# Patient Record
Sex: Female | Born: 1950
Health system: Southern US, Community
[De-identification: ages and names within clinical notes are randomized; demographics above are authoritative.]

## PROBLEM LIST (undated history)

## (undated) DIAGNOSIS — I639 Cerebral infarction, unspecified: Secondary | ICD-10-CM

## (undated) DIAGNOSIS — I10 Essential (primary) hypertension: Secondary | ICD-10-CM

## (undated) DIAGNOSIS — K219 Gastro-esophageal reflux disease without esophagitis: Secondary | ICD-10-CM

## (undated) DIAGNOSIS — R0683 Snoring: Secondary | ICD-10-CM

## (undated) HISTORY — DX: Cerebral infarction, unspecified: I63.9

## (undated) HISTORY — PX: CATARACT EXTRACTION: SUR2

## (undated) HISTORY — PX: COLONOSCOPY: SHX174

---

## 1998-09-19 ENCOUNTER — Other Ambulatory Visit: Admission: RE | Admit: 1998-09-19 | Discharge: 1998-09-19 | Payer: Self-pay | Admitting: *Deleted

## 1998-11-13 ENCOUNTER — Other Ambulatory Visit: Admission: RE | Admit: 1998-11-13 | Discharge: 1998-11-13 | Payer: Self-pay | Admitting: *Deleted

## 1999-10-01 ENCOUNTER — Other Ambulatory Visit: Admission: RE | Admit: 1999-10-01 | Discharge: 1999-10-01 | Payer: Self-pay | Admitting: *Deleted

## 2003-02-16 HISTORY — PX: VAGINAL HYSTERECTOMY: SUR661

## 2003-03-13 ENCOUNTER — Other Ambulatory Visit: Payer: Self-pay

## 2004-03-31 ENCOUNTER — Ambulatory Visit: Payer: Self-pay | Admitting: Obstetrics and Gynecology

## 2004-07-27 ENCOUNTER — Ambulatory Visit: Payer: Self-pay | Admitting: Unknown Physician Specialty

## 2004-07-27 LAB — HM COLONOSCOPY: HM COLON: NORMAL

## 2005-04-20 ENCOUNTER — Ambulatory Visit: Payer: Self-pay | Admitting: Obstetrics and Gynecology

## 2005-08-17 ENCOUNTER — Other Ambulatory Visit: Payer: Self-pay

## 2005-08-23 ENCOUNTER — Ambulatory Visit: Payer: Self-pay | Admitting: Obstetrics and Gynecology

## 2006-04-26 ENCOUNTER — Ambulatory Visit: Payer: Self-pay | Admitting: Obstetrics and Gynecology

## 2007-04-28 ENCOUNTER — Ambulatory Visit: Payer: Self-pay | Admitting: Obstetrics and Gynecology

## 2008-05-01 ENCOUNTER — Ambulatory Visit: Payer: Self-pay | Admitting: Family Medicine

## 2008-05-29 DIAGNOSIS — K219 Gastro-esophageal reflux disease without esophagitis: Secondary | ICD-10-CM | POA: Insufficient documentation

## 2008-05-29 DIAGNOSIS — M1990 Unspecified osteoarthritis, unspecified site: Secondary | ICD-10-CM | POA: Insufficient documentation

## 2008-05-29 DIAGNOSIS — J309 Allergic rhinitis, unspecified: Secondary | ICD-10-CM | POA: Insufficient documentation

## 2008-12-26 DIAGNOSIS — R5381 Other malaise: Secondary | ICD-10-CM | POA: Insufficient documentation

## 2009-07-15 ENCOUNTER — Ambulatory Visit: Payer: Self-pay | Admitting: Family Medicine

## 2010-07-22 ENCOUNTER — Ambulatory Visit: Payer: Self-pay | Admitting: Family Medicine

## 2011-07-27 ENCOUNTER — Ambulatory Visit: Payer: Self-pay | Admitting: Family Medicine

## 2012-08-30 ENCOUNTER — Ambulatory Visit: Payer: Self-pay | Admitting: Family Medicine

## 2013-11-16 LAB — CBC AND DIFFERENTIAL
HCT: 38 % (ref 36–46)
HEMOGLOBIN: 12.8 g/dL (ref 12.0–16.0)
NEUTROS ABS: 51 /uL
Platelets: 284 10*3/uL (ref 150–399)
WBC: 5.4 10^3/mL

## 2013-11-16 LAB — LIPID PANEL
Cholesterol: 148 mg/dL (ref 0–200)
HDL: 40 mg/dL (ref 35–70)
LDL Cholesterol: 81 mg/dL
LDL/HDL RATIO: 2
TRIGLYCERIDES: 137 mg/dL (ref 40–160)

## 2013-11-16 LAB — HEPATIC FUNCTION PANEL
ALK PHOS: 52 U/L (ref 25–125)
ALT: 15 U/L (ref 7–35)
AST: 12 U/L — AB (ref 13–35)
Bilirubin, Total: 0.3 mg/dL

## 2013-11-16 LAB — BASIC METABOLIC PANEL
BUN: 12 mg/dL (ref 4–21)
CREATININE: 0.8 mg/dL (ref 0.5–1.1)
Glucose: 93 mg/dL
Potassium: 4.5 mmol/L (ref 3.4–5.3)
SODIUM: 143 mmol/L (ref 137–147)

## 2013-11-16 LAB — HEMOGLOBIN A1C: HEMOGLOBIN A1C: 5.7 % (ref 4.0–6.0)

## 2013-11-16 LAB — TSH: TSH: 2.06 u[IU]/mL (ref 0.41–5.90)

## 2013-12-18 ENCOUNTER — Ambulatory Visit: Payer: Self-pay | Admitting: Family Medicine

## 2013-12-18 LAB — HM MAMMOGRAPHY: HM MAMMO: NEGATIVE

## 2014-07-08 DIAGNOSIS — E538 Deficiency of other specified B group vitamins: Secondary | ICD-10-CM | POA: Insufficient documentation

## 2014-07-08 DIAGNOSIS — M791 Myalgia, unspecified site: Secondary | ICD-10-CM | POA: Insufficient documentation

## 2014-07-08 DIAGNOSIS — R7303 Prediabetes: Secondary | ICD-10-CM | POA: Insufficient documentation

## 2014-07-08 DIAGNOSIS — R0683 Snoring: Secondary | ICD-10-CM | POA: Insufficient documentation

## 2014-09-16 ENCOUNTER — Encounter: Payer: Self-pay | Admitting: Family Medicine

## 2014-09-16 ENCOUNTER — Ambulatory Visit (INDEPENDENT_AMBULATORY_CARE_PROVIDER_SITE_OTHER): Payer: 59 | Admitting: Family Medicine

## 2014-09-16 VITALS — BP 108/62 | HR 60 | Temp 97.8°F | Resp 14 | Ht 65.0 in | Wt 198.0 lb

## 2014-09-16 DIAGNOSIS — R7309 Other abnormal glucose: Secondary | ICD-10-CM | POA: Diagnosis not present

## 2014-09-16 DIAGNOSIS — E785 Hyperlipidemia, unspecified: Secondary | ICD-10-CM | POA: Diagnosis not present

## 2014-09-16 DIAGNOSIS — K219 Gastro-esophageal reflux disease without esophagitis: Secondary | ICD-10-CM | POA: Diagnosis not present

## 2014-09-16 DIAGNOSIS — I10 Essential (primary) hypertension: Secondary | ICD-10-CM

## 2014-09-16 DIAGNOSIS — R7303 Prediabetes: Secondary | ICD-10-CM

## 2014-09-16 NOTE — Progress Notes (Signed)
Patient ID: Sharon Small, female   DOB: 11-28-1950, 64 y.o.   MRN: [id number]    Subjective:  HPI  Hypertension follow up: Patient was last seen on 05/07/14. B/P at that time was 118/70. Patient is  Not checking her B/P outside of the office. She is not having any cardiac symptoms.  BP Readings from Last 3 Encounters:  09/16/14 108/62  05/07/14 118/70    Hyperlipidemia follow up: She takes Simvastatin daily. Last level checked on 11/16/13 Lab Results  Component Value Date   CHOL 148 11/16/2013   HDL 40 11/16/2013   LDLCALC 81 11/16/2013   TRIG 137 11/16/2013   Patient is tolerating medication without side effects.  GERD follow up: Due to concerns for Omeprazole leading to possible dementia on the last visit patient was advised to take Zantac twice daily for 2 months and then go back to Omeprazole. Insurance would not cover Zantac 300 mg twice daily, so she took 150 mg 2 tablet twice daily and Tums intermittently. Since she still had to take Tums with Zantac she finished Zantac and just taking Tums. She states Tums does help when she gets a flare up but when she was on Omeprazole she did not have any breakthrough with heartburn but due to dementia risks she does not want to take that..     Prior to Admission medications   Medication Sig Start Date End Date Taking? Authorizing Provider  amLODipine-benazepril (LOTREL) 5-20 MG per capsule Take 1 capsule by mouth daily. 03/29/14   Historical Provider, MD  aspirin 81 MG tablet Take 1 tablet by mouth daily. 05/29/08   Historical Provider, MD  Calcium Carbonate 500 MG CHEW Chew 1 tablet by mouth daily. 05/29/08   Historical Provider, MD  Cranberry 1000 MG CAPS Take 2 capsules by mouth daily.    Historical Provider, MD  doxycycline (ORACEA) 40 MG capsule Take 1 capsule by mouth daily.    Historical Provider, MD  Magnesium 65 MG TABS Take 1 tablet by mouth daily. 04/08/09   Historical Provider, MD  Misc Natural Products (GLUCOSAMINE  CHONDROITIN COMPLX) TABS Take 1 tablet by mouth daily.    Historical Provider, MD  omeprazole (PRILOSEC) 20 MG capsule Take 1 capsule by mouth daily. 07/28/11   Historical Provider, MD  ranitidine (ZANTAC) 300 MG capsule Take 1 capsule by mouth 2 (two) times daily. 05/07/14   Historical Provider, MD  simvastatin (ZOCOR) 20 MG tablet Take 1 tablet by mouth at bedtime. 11/13/13   Historical Provider, MD    Patient Active Problem List   Diagnosis Date Noted  . Borderline diabetes 07/08/2014  . Muscle ache 07/08/2014  . Snores 07/08/2014  . B12 deficiency 07/08/2014  . Malaise and fatigue 12/26/2008  . Allergic rhinitis 05/29/2008  . Acid reflux 05/29/2008  . Essential (primary) hypertension 05/29/2008  . HLD (hyperlipidemia) 05/29/2008  . Adiposity 05/29/2008  . Arthritis, degenerative 05/29/2008    No past medical history on file.  History   Social History  . Marital Status: Married    Spouse Name: N/A  . Number of Children: N/A  . Years of Education: N/A   Occupational History  . Not on file.   Social History Main Topics  . Smoking status: Never Smoker   . Smokeless tobacco: Never Used  . Alcohol Use: No  . Drug Use: No  . Sexual Activity: Not Currently   Other Topics Concern  . Not on file   Social History Narrative    No Known  Allergies  Review of Systems  Constitutional: Negative for fever, chills, weight loss and malaise/fatigue.  Respiratory: Negative for cough, sputum production, shortness of breath and wheezing.   Cardiovascular: Positive for leg swelling. Negative for chest pain, palpitations, orthopnea and claudication.  Gastrointestinal: Positive for heartburn (at times and will have chest tightness and cough then also.).  Musculoskeletal: Negative for myalgias, back pain, joint pain and neck pain.  Neurological: Negative.  Negative for headaches.  Endo/Heme/Allergies: Negative.   Psychiatric/Behavioral: Negative.     Immunization History    Administered Date(s) Administered  . Tdap 11/23/2006  . Zoster 07/28/2011   Objective:  BP 108/62 mmHg  Pulse 60  Temp(Src) 97.8 F (36.6 C)  Resp 14  Ht 5\' 5"  (1.651 m)  Wt 198 lb (89.812 kg)  BMI 32.95 kg/m2  Physical Exam  Constitutional: She is oriented to person, place, and time and well-developed, well-nourished, and in no distress.  HENT:  Head: Normocephalic.  Eyes: Conjunctivae are normal. Pupils are equal, round, and reactive to light.  Neck: Normal range of motion. Neck supple.  Cardiovascular: Normal rate, regular rhythm, normal heart sounds and intact distal pulses.  Exam reveals no friction rub.   No murmur heard. Pulmonary/Chest: Breath sounds normal. No respiratory distress. She has no wheezes. She has no rales.  Abdominal: Soft. Bowel sounds are normal. She exhibits no distension. There is no tenderness.  Musculoskeletal: Normal range of motion. She exhibits no edema or tenderness.  Neurological: She is alert and oriented to person, place, and time. Gait normal.  Skin: Skin is warm and dry.  Psychiatric: Mood, memory, affect and judgment normal.    Lab Results  Component Value Date   WBC 5.4 11/16/2013   HGB 12.8 11/16/2013   HCT 38 11/16/2013   PLT 284 11/16/2013   CHOL 148 11/16/2013   TRIG 137 11/16/2013   HDL 40 11/16/2013   LDLCALC 81 11/16/2013   TSH 2.06 11/16/2013   HGBA1C 5.7 11/16/2013    CMP     Component Value Date/Time   NA 143 11/16/2013   K 4.5 11/16/2013   BUN 12 11/16/2013   CREATININE 0.8 11/16/2013   AST 12* 11/16/2013   ALT 15 11/16/2013   ALKPHOS 52 11/16/2013    Assessment and Plan :  1. Essential (primary) hypertension Stable. Continue current medication.  2. Gastroesophageal reflux disease, esophagitis presence not specified Zantac is not controlling symptoms well. Patient will take Tums and patient advised to write down how many times she has to take it a day/a week. Will re check on the next visit.  3. HLD  (hyperlipidemia) Stable on the last check.  4. 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  Consider BMD on the next visit.    Patient was seen and examined by Dr. Eulas Post and note was scribed by Theressa Millard, RMA.    Miguel Aschoff MD [facility] 09/16/2014 4:23 PM

## 2014-10-17 ENCOUNTER — Ambulatory Visit: Payer: 59 | Admitting: Family Medicine

## 2014-10-24 ENCOUNTER — Ambulatory Visit (INDEPENDENT_AMBULATORY_CARE_PROVIDER_SITE_OTHER): Payer: 59 | Admitting: Family Medicine

## 2014-10-24 ENCOUNTER — Encounter: Payer: Self-pay | Admitting: Family Medicine

## 2014-10-24 VITALS — BP 128/72 | HR 62 | Temp 97.6°F | Resp 14 | Wt 199.0 lb

## 2014-10-24 DIAGNOSIS — E669 Obesity, unspecified: Secondary | ICD-10-CM

## 2014-10-24 DIAGNOSIS — M791 Myalgia, unspecified site: Secondary | ICD-10-CM

## 2014-10-24 DIAGNOSIS — E785 Hyperlipidemia, unspecified: Secondary | ICD-10-CM | POA: Diagnosis not present

## 2014-10-24 DIAGNOSIS — R7309 Other abnormal glucose: Secondary | ICD-10-CM | POA: Diagnosis not present

## 2014-10-24 DIAGNOSIS — K219 Gastro-esophageal reflux disease without esophagitis: Secondary | ICD-10-CM | POA: Diagnosis not present

## 2014-10-24 DIAGNOSIS — R7303 Prediabetes: Secondary | ICD-10-CM

## 2014-10-24 NOTE — Progress Notes (Signed)
Patient ID: Sharon Small, female   DOB: [birth year], 64 y.o.   MRN: [id number]    Subjective:  HPI  GERD follow up: Patient is here for 1 month follow up. Patient has been taking Tums on average 3 daily sometimes less but usually no more than that. She is happy with just taking Tums daily as long as there is no long term issues that she can develop from it.  Prior to Admission medications   Medication Sig Start Date End Date Taking? Authorizing Provider  amLODipine-benazepril (LOTREL) 5-20 MG per capsule Take 1 capsule by mouth daily. 03/29/14  Yes Historical Provider, MD  aspirin 81 MG tablet Take 1 tablet by mouth daily. 05/29/08  Yes Historical Provider, MD  Biotin 10 MG CAPS Take by mouth.   Yes Historical Provider, MD  Calcium Carbonate 500 MG CHEW Chew 1 tablet by mouth daily. 05/29/08  Yes Historical Provider, MD  FINACEA 15 % FOAM APPLY TO SKIN BID 08/10/14  Yes Historical Provider, MD  Magnesium 65 MG TABS Take 1 tablet by mouth daily. 04/08/09  Yes Historical Provider, MD  Misc Natural Products (GLUCOSAMINE CHONDROITIN COMPLX) TABS Take 1 tablet by mouth daily.   Yes Historical Provider, MD  simvastatin (ZOCOR) 20 MG tablet Take 1 tablet by mouth at bedtime. 11/13/13  Yes Historical Provider, MD    Patient Active Problem List   Diagnosis Date Noted  . Borderline diabetes 07/08/2014  . Muscle ache 07/08/2014  . Snores 07/08/2014  . B12 deficiency 07/08/2014  . Malaise and fatigue 12/26/2008  . Allergic rhinitis 05/29/2008  . Acid reflux 05/29/2008  . Essential (primary) hypertension 05/29/2008  . HLD (hyperlipidemia) 05/29/2008  . Adiposity 05/29/2008  . Arthritis, degenerative 05/29/2008    No past medical history on file.  Social History   Social History  . Marital Status: Married    Spouse Name: N/A  . Number of Children: N/A  . Years of Education: N/A   Occupational History  . Not on file.   Social History Main Topics  . Smoking status: Never Smoker   .  Smokeless tobacco: Never Used  . Alcohol Use: No  . Drug Use: No  . Sexual Activity: Not Currently   Other Topics Concern  . Not on file   Social History Narrative    No Known Allergies  Review of Systems  Constitutional: Negative.   Respiratory: Negative.   Cardiovascular: Negative.   Gastrointestinal: Negative.   Musculoskeletal: Positive for myalgias. Negative for back pain, joint pain and neck pain.  Skin: Negative.   Neurological: Negative.   Endo/Heme/Allergies: Negative.   Psychiatric/Behavioral: Negative.     Immunization History  Administered Date(s) Administered  . Tdap 11/23/2006  . Zoster 07/28/2011   Objective:  BP 128/72 mmHg  Pulse 62  Temp(Src) 97.6 F (36.4 C)  Resp 14  Wt 199 lb (90.266 kg)  Physical Exam  Constitutional: She is oriented to person, place, and time and well-developed, well-nourished, and in no distress.  Patient overweight but not obese. She has large breasts and is pear  Shaped. No significant abdominal obesity. We'll get a waist circumference on next visit.  HENT:  Head: Normocephalic and atraumatic.  Right Ear: External ear normal.  Left Ear: External ear normal.  Nose: Nose normal.  Eyes: Conjunctivae are normal.  Neck: Neck supple.  Cardiovascular: Normal rate and normal heart sounds.   Pulmonary/Chest: Effort normal and breath sounds normal.  Abdominal: Soft.  Musculoskeletal:  There is a chronic stent  in both shoulders probably strep digs into her shoulders from her large breasts.  Neurological: She is alert and oriented to person, place, and time.  Skin: Skin is warm and dry.  Psychiatric: Mood, memory, affect and judgment normal.    Lab Results  Component Value Date   WBC 5.4 11/16/2013   HGB 12.8 11/16/2013   HCT 38 11/16/2013   PLT 284 11/16/2013   CHOL 148 11/16/2013   TRIG 137 11/16/2013   HDL 40 11/16/2013   LDLCALC 81 11/16/2013   TSH 2.06 11/16/2013   HGBA1C 5.7 11/16/2013    CMP     Component  Value Date/Time   NA 143 11/16/2013   K 4.5 11/16/2013   BUN 12 11/16/2013   CREATININE 0.8 11/16/2013   AST 12* 11/16/2013   ALT 15 11/16/2013   ALKPHOS 52 11/16/2013    Assessment and Plan :  1. Gastroesophageal reflux disease, esophagitis presence not specified  - CBC with Differential/Platelet - TSH  2. Borderline diabetes  - HgB A1c - Comprehensive Metabolic Panel (CMET)  3. Muscle ache   4. HLD (hyperlipidemia)  - Lipid Panel With LDL/HDL Ratio - CBC with Differential/Platelet - Comprehensive Metabolic Panel (CMET)  5. Adiposity Overweight but not obese. BMI not accurate in pt with large breasts and body habitus that is pear shaped. - TSH   Miguel Aschoff MD [facility] 10/24/2014 4:14 PM

## 2014-11-04 ENCOUNTER — Encounter: Payer: Self-pay | Admitting: Family Medicine

## 2014-11-04 ENCOUNTER — Ambulatory Visit (INDEPENDENT_AMBULATORY_CARE_PROVIDER_SITE_OTHER): Payer: 59 | Admitting: Family Medicine

## 2014-11-04 VITALS — BP 98/62 | HR 58 | Temp 97.6°F | Resp 16 | Wt 200.6 lb

## 2014-11-04 DIAGNOSIS — R059 Cough, unspecified: Secondary | ICD-10-CM

## 2014-11-04 DIAGNOSIS — J069 Acute upper respiratory infection, unspecified: Secondary | ICD-10-CM | POA: Diagnosis not present

## 2014-11-04 DIAGNOSIS — R05 Cough: Secondary | ICD-10-CM

## 2014-11-04 MED ORDER — HYDROCOD POLST-CPM POLST ER 10-8 MG/5ML PO SUER: 5.0000 mL | Freq: Two times a day (BID) | ORAL | Status: DC | PRN

## 2014-11-04 MED ORDER — AZITHROMYCIN 250 MG PO TABS: ORAL_TABLET | ORAL | Status: DC

## 2014-11-04 NOTE — Progress Notes (Signed)
Patient ID: Sharon Small, female   DOB: 06-20-1950, 64 y.o.   MRN: [id number]       Patient: Sharon Small Female    DOB: [birth year]   64 y.o.   MRN: [id number] Visit Date: 11/04/2014  Today's Provider: Wilhemena Durie, MD   Chief Complaint  Patient presents with  . Cough    started a week ago Monday or Tueday of last week. pt stated that she has taken Tussinex in the past with relief.    Subjective:    Cough This is a new problem. The current episode started in the past 7 days. The problem has been unchanged. Episode frequency: every couple of hrs. The cough is productive of sputum. Associated symptoms include headaches, heartburn, nasal congestion, shortness of breath, sweats and wheezing. Pertinent negatives include no chest pain, chills, ear congestion, ear pain, fever, rhinorrhea or sore throat. Nothing aggravates the symptoms. Treatments tried: delsium cough syrup and cough drops. The treatment provided mild relief. Her past medical history is significant for bronchitis.       No Known Allergies Previous Medications   AMLODIPINE-BENAZEPRIL (LOTREL) 5-20 MG PER CAPSULE    Take 1 capsule by mouth daily.   ASPIRIN 81 MG TABLET    Take 1 tablet by mouth daily.   BIOTIN 10 MG CAPS    Take by mouth.   CALCIUM CARBONATE 500 MG CHEW    Chew 1 tablet by mouth daily.   FINACEA 15 % FOAM    APPLY TO SKIN BID   MAGNESIUM 65 MG TABS    Take 1 tablet by mouth daily.   MISC NATURAL PRODUCTS (GLUCOSAMINE CHONDROITIN COMPLX) TABS    Take 1 tablet by mouth daily.   SIMVASTATIN (ZOCOR) 20 MG TABLET    Take 1 tablet by mouth at bedtime.    Review of Systems  Constitutional: Negative for fever and chills.  HENT: Negative for ear pain, rhinorrhea and sore throat.   Respiratory: Positive for cough, shortness of breath and wheezing.   Cardiovascular: Negative for chest pain.  Gastrointestinal: Positive for heartburn.  Allergic/Immunologic: Negative.   Neurological: Positive for  headaches.  Hematological: Negative.   Psychiatric/Behavioral: Negative.     Social History  Substance Use Topics  . Smoking status: Never Smoker   . Smokeless tobacco: Never Used  . Alcohol Use: No   Objective:   BP 98/62 mmHg  Pulse 58  Temp(Src) 97.6 F (36.4 C) (Oral)  Resp 16  Wt 200 lb 9.6 oz (90.992 kg)  Physical Exam  Constitutional: She appears well-developed and well-nourished.  HENT:  Head: Normocephalic and atraumatic.  Right Ear: External ear normal.  Left Ear: External ear normal.  Mouth/Throat: Oropharynx is clear and moist.  Eyes: Conjunctivae are normal.  Neck: Neck supple.  Cardiovascular: Normal rate, regular rhythm and normal heart sounds.   Pulmonary/Chest: Effort normal and breath sounds normal.  Abdominal: Soft.  Neurological: She is alert.  Skin: Skin is warm and dry.  Psychiatric: She has a normal mood and affect. Her behavior is normal. Judgment and thought content normal.        Assessment & Plan:     1. Cough Tussionex for nocturnal cough.  2. Upper respiratory infection Zpak if she worsens.        Richard Cranford Mon, MD  [facility]

## 2014-11-08 ENCOUNTER — Other Ambulatory Visit: Payer: Self-pay | Admitting: Family Medicine

## 2014-11-16 LAB — TSH: TSH: 2.72 u[IU]/mL (ref 0.450–4.500)

## 2014-11-16 LAB — LIPID PANEL WITH LDL/HDL RATIO
CHOLESTEROL TOTAL: 155 mg/dL (ref 100–199)
HDL: 51 mg/dL (ref >39)
LDL Calculated: 78 mg/dL (ref 0–99)
LDl/HDL Ratio: 1.5 ratio units (ref 0.0–3.2)
TRIGLYCERIDES: 131 mg/dL (ref 0–149)
VLDL CHOLESTEROL CAL: 26 mg/dL (ref 5–40)

## 2014-11-16 LAB — CBC WITH DIFFERENTIAL/PLATELET
BASOS: 1 %
Basophils Absolute: 0 10*3/uL (ref 0.0–0.2)
EOS (ABSOLUTE): 0.1 10*3/uL (ref 0.0–0.4)
EOS: 2 %
HEMATOCRIT: 38 % (ref 34.0–46.6)
Hemoglobin: 12.8 g/dL (ref 11.1–15.9)
IMMATURE GRANULOCYTES: 0 %
Immature Grans (Abs): 0 10*3/uL (ref 0.0–0.1)
LYMPHS ABS: 2.1 10*3/uL (ref 0.7–3.1)
Lymphs: 38 %
MCH: 30.3 pg (ref 26.6–33.0)
MCHC: 33.7 g/dL (ref 31.5–35.7)
MCV: 90 fL (ref 79–97)
MONOS ABS: 0.3 10*3/uL (ref 0.1–0.9)
Monocytes: 6 %
Neutrophils Absolute: 3 10*3/uL (ref 1.4–7.0)
Neutrophils: 53 %
Platelets: 295 10*3/uL (ref 150–379)
RBC: 4.22 x10E6/uL (ref 3.77–5.28)
RDW: 13.6 % (ref 12.3–15.4)
WBC: 5.6 10*3/uL (ref 3.4–10.8)

## 2014-11-16 LAB — COMPREHENSIVE METABOLIC PANEL
A/G RATIO: 1.9 (ref 1.1–2.5)
ALT: 14 IU/L (ref 0–32)
AST: 13 IU/L (ref 0–40)
Albumin: 4.4 g/dL (ref 3.6–4.8)
Alkaline Phosphatase: 45 IU/L (ref 39–117)
BUN / CREAT RATIO: 24 (ref 11–26)
BUN: 16 mg/dL (ref 8–27)
Bilirubin Total: 0.3 mg/dL (ref 0.0–1.2)
CALCIUM: 9.5 mg/dL (ref 8.7–10.3)
CO2: 28 mmol/L (ref 18–29)
Chloride: 100 mmol/L (ref 97–108)
Creatinine, Ser: 0.68 mg/dL (ref 0.57–1.00)
GFR, EST AFRICAN AMERICAN: 106 mL/min/{1.73_m2} (ref >59)
GFR, EST NON AFRICAN AMERICAN: 92 mL/min/{1.73_m2} (ref >59)
GLOBULIN, TOTAL: 2.3 g/dL (ref 1.5–4.5)
Glucose: 96 mg/dL (ref 65–99)
POTASSIUM: 4.7 mmol/L (ref 3.5–5.2)
SODIUM: 142 mmol/L (ref 134–144)
Total Protein: 6.7 g/dL (ref 6.0–8.5)

## 2014-11-16 LAB — HEMOGLOBIN A1C
ESTIMATED AVERAGE GLUCOSE: 120 mg/dL
Hgb A1c MFr Bld: 5.8 % — ABNORMAL HIGH (ref 4.8–5.6)

## 2014-11-19 ENCOUNTER — Telehealth: Payer: Self-pay

## 2014-11-19 NOTE — Telephone Encounter (Signed)
-----   Message from Jerrol Banana., MD sent at 11/19/2014  8:41 AM EDT ----- Labs stable. Prediabetic. Diet and exercise.

## 2014-11-19 NOTE — Telephone Encounter (Signed)
Left message to call back  

## 2014-11-20 NOTE — Telephone Encounter (Signed)
Advised patient of lab results. Copy left up front for patient to pick up.

## 2014-12-30 ENCOUNTER — Other Ambulatory Visit: Payer: Self-pay | Admitting: Family Medicine

## 2014-12-30 DIAGNOSIS — Z1231 Encounter for screening mammogram for malignant neoplasm of breast: Secondary | ICD-10-CM

## 2015-01-07 ENCOUNTER — Ambulatory Visit
Admission: RE | Admit: 2015-01-07 | Discharge: 2015-01-07 | Disposition: A | Discharge status: Home / Self Care | Payer: 59 | Source: Ambulatory Visit | Attending: Family Medicine | Admitting: Family Medicine

## 2015-01-07 DIAGNOSIS — Z1231 Encounter for screening mammogram for malignant neoplasm of breast: Secondary | ICD-10-CM | POA: Insufficient documentation

## 2015-04-23 ENCOUNTER — Encounter: Payer: 59 | Admitting: Family Medicine

## 2015-04-25 ENCOUNTER — Other Ambulatory Visit: Payer: Self-pay | Admitting: Family Medicine

## 2015-04-29 ENCOUNTER — Telehealth: Payer: Self-pay | Admitting: Family Medicine

## 2015-04-29 DIAGNOSIS — Z0000 Encounter for general adult medical examination without abnormal findings: Secondary | ICD-10-CM

## 2015-04-29 NOTE — Telephone Encounter (Signed)
Pt is coming in for her CPE on 05/22/15 and would like to get her lab slip before her CPE b/c her labs are free since she works at [employer] and would rather have the results back when she comes in for her appt. Thanks TNP

## 2015-04-30 ENCOUNTER — Encounter: Payer: 59 | Admitting: Family Medicine

## 2015-04-30 NOTE — Telephone Encounter (Signed)
That is fine as long she is she has insurance and not Medicare at age 65. 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

## 2015-04-30 NOTE — Telephone Encounter (Signed)
Lab slip printed and up front for pt to pick up. Pt informed.

## 2015-04-30 NOTE — Telephone Encounter (Signed)
Ok to order routine labs? Please advise. Thanks!

## 2015-05-22 ENCOUNTER — Encounter: Payer: 59 | Admitting: Family Medicine

## 2015-06-10 ENCOUNTER — Telehealth: Payer: Self-pay

## 2015-06-10 LAB — COMPREHENSIVE METABOLIC PANEL
ALT: 23 IU/L (ref 0–32)
AST: 21 IU/L (ref 0–40)
Albumin/Globulin Ratio: 1.8 (ref 1.2–2.2)
Albumin: 4.4 g/dL (ref 3.6–4.8)
Alkaline Phosphatase: 60 IU/L (ref 39–117)
BUN/Creatinine Ratio: 23 (ref 12–28)
BUN: 15 mg/dL (ref 8–27)
Bilirubin Total: 0.4 mg/dL (ref 0.0–1.2)
CHLORIDE: 100 mmol/L (ref 96–106)
CO2: 26 mmol/L (ref 18–29)
Calcium: 9.5 mg/dL (ref 8.7–10.3)
Creatinine, Ser: 0.66 mg/dL (ref 0.57–1.00)
GFR calc non Af Amer: 93 mL/min/{1.73_m2} (ref >59)
GFR, EST AFRICAN AMERICAN: 107 mL/min/{1.73_m2} (ref >59)
GLUCOSE: 93 mg/dL (ref 65–99)
Globulin, Total: 2.5 g/dL (ref 1.5–4.5)
Potassium: 4.4 mmol/L (ref 3.5–5.2)
Sodium: 143 mmol/L (ref 134–144)
TOTAL PROTEIN: 6.9 g/dL (ref 6.0–8.5)

## 2015-06-10 LAB — CBC WITH DIFFERENTIAL/PLATELET
BASOS ABS: 0 10*3/uL (ref 0.0–0.2)
Basos: 0 %
EOS (ABSOLUTE): 0.1 10*3/uL (ref 0.0–0.4)
Eos: 2 %
HEMOGLOBIN: 12.8 g/dL (ref 11.1–15.9)
Hematocrit: 38.1 % (ref 34.0–46.6)
Immature Grans (Abs): 0 10*3/uL (ref 0.0–0.1)
Immature Granulocytes: 0 %
LYMPHS ABS: 1.9 10*3/uL (ref 0.7–3.1)
Lymphs: 36 %
MCH: 29.8 pg (ref 26.6–33.0)
MCHC: 33.6 g/dL (ref 31.5–35.7)
MCV: 89 fL (ref 79–97)
Monocytes Absolute: 0.4 10*3/uL (ref 0.1–0.9)
Monocytes: 7 %
NEUTROS ABS: 2.9 10*3/uL (ref 1.4–7.0)
Neutrophils: 55 %
PLATELETS: 309 10*3/uL (ref 150–379)
RBC: 4.3 x10E6/uL (ref 3.77–5.28)
RDW: 13.4 % (ref 12.3–15.4)
WBC: 5.2 10*3/uL (ref 3.4–10.8)

## 2015-06-10 LAB — LIPID PANEL WITH LDL/HDL RATIO
CHOLESTEROL TOTAL: 160 mg/dL (ref 100–199)
HDL: 50 mg/dL (ref >39)
LDL CALC: 73 mg/dL (ref 0–99)
LDl/HDL Ratio: 1.5 ratio units (ref 0.0–3.2)
Triglycerides: 184 mg/dL — ABNORMAL HIGH (ref 0–149)
VLDL Cholesterol Cal: 37 mg/dL (ref 5–40)

## 2015-06-10 LAB — TSH: TSH: 2.8 u[IU]/mL (ref 0.450–4.500)

## 2015-06-10 NOTE — Telephone Encounter (Signed)
-----   Message from Jerrol Banana., MD sent at 06/10/2015  8:58 AM EDT ----- Labs stable. Continue to work on diet and exercise for slightly elevated triglycerides

## 2015-06-10 NOTE — Telephone Encounter (Signed)
Advised patient as below.  

## 2015-06-10 NOTE — Telephone Encounter (Signed)
Left message to call back  

## 2015-06-11 ENCOUNTER — Ambulatory Visit (INDEPENDENT_AMBULATORY_CARE_PROVIDER_SITE_OTHER): Payer: 59 | Admitting: Family Medicine

## 2015-06-11 ENCOUNTER — Encounter: Payer: Self-pay | Admitting: Family Medicine

## 2015-06-11 VITALS — BP 122/74 | Temp 98.0°F | Resp 16 | Ht 65.0 in | Wt 208.0 lb

## 2015-06-11 DIAGNOSIS — M2042 Other hammer toe(s) (acquired), left foot: Secondary | ICD-10-CM

## 2015-06-11 DIAGNOSIS — Z0000 Encounter for general adult medical examination without abnormal findings: Secondary | ICD-10-CM

## 2015-06-11 DIAGNOSIS — M21611 Bunion of right foot: Secondary | ICD-10-CM | POA: Diagnosis not present

## 2015-06-11 DIAGNOSIS — M21612 Bunion of left foot: Secondary | ICD-10-CM

## 2015-06-11 DIAGNOSIS — Z1211 Encounter for screening for malignant neoplasm of colon: Secondary | ICD-10-CM

## 2015-06-11 DIAGNOSIS — M2041 Other hammer toe(s) (acquired), right foot: Secondary | ICD-10-CM | POA: Diagnosis not present

## 2015-06-11 DIAGNOSIS — E669 Obesity, unspecified: Secondary | ICD-10-CM | POA: Diagnosis not present

## 2015-06-11 NOTE — Progress Notes (Signed)
Patient ID: Sharon Small, female   DOB: December 01, 1950, 65 y.o.   MRN: [id number]       Patient: Sharon Small, Female    DOB: Apr 23, 1950, 65 y.o.   MRN: [id number] Visit Date: 06/11/2015  Today's Provider: Wilhemena Durie, MD   Chief Complaint  Patient presents with  . Annual Exam   Subjective:    Annual physical exam Sharon Small is a 65 y.o. female who presents today for health maintenance and complete physical. She feels well. She reports exercising none . She reports she is sleeping well.  Mammogram- 01/07/2015. Normal. Colonoscopy- 07/27/2004. Normal. Repeat in 10 years. Pap- 06/17/2009. Normal. BMD- 04/14/2005. Normal. Tdap- 11/23/2006. Zoster- 07/28/2011.     Review of Systems  Constitutional: Negative.   HENT: Negative.   Eyes: Negative.   Respiratory: Negative.   Cardiovascular: Negative.   Gastrointestinal: Negative.   Endocrine: Negative.   Genitourinary: Negative.   Musculoskeletal: Positive for myalgias.  Skin: Negative.   Allergic/Immunologic: Negative.   Neurological: Negative.   Hematological: Negative.   Psychiatric/Behavioral: Negative.     Social History      She  reports that she has never smoked. She has never used smokeless tobacco. She reports that she does not drink alcohol or use illicit drugs.       Social History   Social History  . Marital Status: Married    Spouse Name: N/A  . Number of Children: N/A  . Years of Education: N/A   Social History Main Topics  . Smoking status: Never Smoker   . Smokeless tobacco: Never Used  . Alcohol Use: No  . Drug Use: No  . Sexual Activity: Not Currently   Other Topics Concern  . None   Social History Narrative    History reviewed. No pertinent past medical history.   Patient Active Problem List   Diagnosis Date Noted  . Borderline diabetes 07/08/2014  . Muscle ache 07/08/2014  . Snores 07/08/2014  . B12 deficiency 07/08/2014  . Malaise and fatigue 12/26/2008  . Allergic  rhinitis 05/29/2008  . Acid reflux 05/29/2008  . Essential (primary) hypertension 05/29/2008  . HLD (hyperlipidemia) 05/29/2008  . Adiposity 05/29/2008  . Arthritis, degenerative 05/29/2008    Past Surgical History  Procedure Laterality Date  . Cataract extraction Bilateral 2003 & 2005  . Vaginal hysterectomy  2005    Bilateral oophorectomy    Family History        Family Status  Relation Status Death Age  . Mother Deceased 78    due to COPD  . Father Deceased 9    cause of death MI  . Sister Alive   . Sister Alive   . Daughter Alive   . Son Alive   . Daughter Alive         Her family history includes Breast cancer (age of onset: 38) in her maternal aunt; COPD in her mother; Glaucoma in her sister; Heart disease in her father; Hypertension in her sister; Osteoporosis in her mother; Peripheral Artery Disease in her mother.    No Known Allergies  Previous Medications   AMLODIPINE-BENAZEPRIL (LOTREL) 5-20 MG CAPSULE    Take 1 capsule by mouth  daily   ASPIRIN 81 MG TABLET    Take 1 tablet by mouth daily.   AZITHROMYCIN (ZITHROMAX) 250 MG TABLET    2 by mouth on day 1 and then 1 by mouth days 2 through 5   BIOTIN 10 MG CAPS  Take by mouth.   BISOPROLOL-HYDROCHLOROTHIAZIDE (ZIAC) 10-6.25 MG TABLET    Take 1 tablet by mouth  daily   CALCIUM CARBONATE 500 MG CHEW    Chew 1 tablet by mouth daily. Reported on 06/11/2015   CHLORPHENIRAMINE-HYDROCODONE (TUSSIONEX PENNKINETIC ER) 10-8 MG/5ML SUER    Take 5 mLs by mouth every 12 (twelve) hours as needed for cough.   FINACEA 15 % FOAM    APPLY TO SKIN BID   MAGNESIUM 65 MG TABS    Take 1 tablet by mouth daily.   MISC NATURAL PRODUCTS (GLUCOSAMINE CHONDROITIN COMPLX) TABS    Take 1 tablet by mouth daily.   SIMVASTATIN (ZOCOR) 20 MG TABLET    Take 1 tablet by mouth at  bedtime    Patient Care Team: Jerrol Banana., MD as PCP - General (Family Medicine)     Objective:   Vitals: BP 122/74 mmHg  Temp(Src) 98 F (36.7 C)   Resp 16  Ht 5\' 5"  (1.651 m)  Wt 208 lb (94.348 kg)  BMI 34.61 kg/m2   Physical Exam  Constitutional: She is oriented to person, place, and time. She appears well-developed and well-nourished.  HENT:  Head: Normocephalic and atraumatic.  Right Ear: External ear normal.  Left Ear: External ear normal.  Eyes: Conjunctivae are normal. Pupils are equal, round, and reactive to light.  Neck: Normal range of motion. Neck supple.  Cardiovascular: Normal rate, regular rhythm, normal heart sounds and intact distal pulses.   No murmur heard. Pulmonary/Chest: Effort normal and breath sounds normal. No respiratory distress.  Dents present bilateral shoulders from bra straps due to large breasts, mild chronic lumbar and thoracic pain from large breasts  Abdominal: Soft. There is no tenderness. There is no rebound.  Musculoskeletal: Normal range of motion. She exhibits no edema or tenderness.  Mild hammer toes bilateral and bunions present bilateral  Neurological: She is alert and oriented to person, place, and time.  Skin: No rash noted. No erythema.  Psychiatric: She has a normal mood and affect. Her behavior is normal. Judgment and thought content normal.  large breasts with no masses   Depression Screen PHQ 2/9 Scores 10/24/2014 09/16/2014 09/16/2014  PHQ - 2 Score 0 0 0      Assessment & Plan:    1. Annual physical exam Patient up-to-date on all health maintenance issues. Will repeat Pap in 2018. It is always been normal. Mammogram due in November or December. Colonoscopy due now. Overall health is good.  2. Adiposity Work on habits.patient's BMI is not an accurate indicator of her weight. She has very large breasts since she has a large frame.  3. Acquired bilateral hammer toes  4. Bilateral bunions Non painful at this time, does have cramps in her feet/toes at night time.  I have done the exam and reviewed the above chart and it is accurate to the best of my knowledge.

## 2015-06-17 ENCOUNTER — Telehealth: Payer: Self-pay

## 2015-06-17 ENCOUNTER — Other Ambulatory Visit: Payer: Self-pay

## 2015-06-17 NOTE — Telephone Encounter (Signed)
Gastroenterology Pre-Procedure Review  Request Date: 07/04/15 Requesting Physician: Dr. Rosanna Randy  PATIENT REVIEW QUESTIONS: The patient responded to the following health history questions as indicated:    1. Are you having any GI issues? no 2. Do you have a personal history of Polyps? no 3. Do you have a family history of Colon Cancer or Polyps? no 4. Diabetes Mellitus? no 5. Joint replacements in the past 12 months?no 6. Major health problems in the past 3 months?no 7. Any artificial heart valves, MVP, or defibrillator?no    MEDICATIONS & ALLERGIES:    Patient reports the following regarding taking any anticoagulation/antiplatelet therapy:   Plavix, Coumadin, Eliquis, Xarelto, Lovenox, Pradaxa, Brilinta, or Effient? no Aspirin? no  Patient confirms/reports the following medications:  Current Outpatient Prescriptions  Medication Sig Dispense Refill  . amLODipine-benazepril (LOTREL) 5-20 MG capsule Take 1 capsule by mouth  daily 90 capsule 2  . aspirin 81 MG tablet Take 1 tablet by mouth daily.    Marland Kitchen azithromycin (ZITHROMAX) 250 MG tablet 2 by mouth on day 1 and then 1 by mouth days 2 through 5 (Patient not taking: Reported on 06/11/2015) 6 tablet 0  . Biotin 10 MG CAPS Take by mouth.    . bisoprolol-hydrochlorothiazide (ZIAC) 10-6.25 MG tablet Take 1 tablet by mouth  daily 90 tablet 2  . Calcium Carbonate 500 MG CHEW Chew 1 tablet by mouth daily. Reported on 06/11/2015    . chlorpheniramine-HYDROcodone (TUSSIONEX PENNKINETIC ER) 10-8 MG/5ML SUER Take 5 mLs by mouth every 12 (twelve) hours as needed for cough. 140 mL 0  . FINACEA 15 % FOAM APPLY TO SKIN BID  11  . Magnesium 65 MG TABS Take 1 tablet by mouth daily.    . Misc Natural Products (GLUCOSAMINE CHONDROITIN COMPLX) TABS Take 1 tablet by mouth daily.    . simvastatin (ZOCOR) 20 MG tablet Take 1 tablet by mouth at  bedtime 90 tablet 3   No current facility-administered medications for this visit.    Patient confirms/reports the  following allergies:  No Known Allergies  No orders of the defined types were placed in this encounter.    AUTHORIZATION INFORMATION Primary Insurance: 1D#: Group #:  Secondary Insurance: 1D#: Group #:  SCHEDULE INFORMATION: Date: 07/04/15 Time: Location: [place]

## 2015-07-03 NOTE — Discharge Instructions (Signed)

## 2015-07-04 ENCOUNTER — Ambulatory Visit: Payer: 59 | Admitting: Anesthesiology

## 2015-07-04 ENCOUNTER — Encounter
Admission: RE | Disposition: A | Discharge status: Home / Self Care | Payer: Self-pay | Source: Ambulatory Visit | Attending: Gastroenterology

## 2015-07-04 ENCOUNTER — Ambulatory Visit
Admission: RE | Admit: 2015-07-04 | Discharge: 2015-07-04 | Disposition: A | Discharge status: Home / Self Care | Payer: 59 | Source: Ambulatory Visit | Attending: Gastroenterology | Admitting: Gastroenterology

## 2015-07-04 DIAGNOSIS — Z7982 Long term (current) use of aspirin: Secondary | ICD-10-CM | POA: Insufficient documentation

## 2015-07-04 DIAGNOSIS — Z8262 Family history of osteoporosis: Secondary | ICD-10-CM | POA: Insufficient documentation

## 2015-07-04 DIAGNOSIS — Z8249 Family history of ischemic heart disease and other diseases of the circulatory system: Secondary | ICD-10-CM | POA: Insufficient documentation

## 2015-07-04 DIAGNOSIS — I10 Essential (primary) hypertension: Secondary | ICD-10-CM | POA: Diagnosis not present

## 2015-07-04 DIAGNOSIS — R0683 Snoring: Secondary | ICD-10-CM | POA: Diagnosis not present

## 2015-07-04 DIAGNOSIS — Z803 Family history of malignant neoplasm of breast: Secondary | ICD-10-CM | POA: Insufficient documentation

## 2015-07-04 DIAGNOSIS — Z90710 Acquired absence of both cervix and uterus: Secondary | ICD-10-CM | POA: Insufficient documentation

## 2015-07-04 DIAGNOSIS — Z9841 Cataract extraction status, right eye: Secondary | ICD-10-CM | POA: Insufficient documentation

## 2015-07-04 DIAGNOSIS — Z79899 Other long term (current) drug therapy: Secondary | ICD-10-CM | POA: Diagnosis not present

## 2015-07-04 DIAGNOSIS — Z825 Family history of asthma and other chronic lower respiratory diseases: Secondary | ICD-10-CM | POA: Diagnosis not present

## 2015-07-04 DIAGNOSIS — Z820 Family history of epilepsy and other diseases of the nervous system: Secondary | ICD-10-CM | POA: Diagnosis not present

## 2015-07-04 DIAGNOSIS — Z1211 Encounter for screening for malignant neoplasm of colon: Secondary | ICD-10-CM | POA: Diagnosis not present

## 2015-07-04 DIAGNOSIS — K641 Second degree hemorrhoids: Secondary | ICD-10-CM | POA: Diagnosis not present

## 2015-07-04 DIAGNOSIS — K219 Gastro-esophageal reflux disease without esophagitis: Secondary | ICD-10-CM | POA: Diagnosis not present

## 2015-07-04 DIAGNOSIS — Z9842 Cataract extraction status, left eye: Secondary | ICD-10-CM | POA: Insufficient documentation

## 2015-07-04 HISTORY — DX: Essential (primary) hypertension: I10

## 2015-07-04 HISTORY — DX: Gastro-esophageal reflux disease without esophagitis: K21.9

## 2015-07-04 HISTORY — DX: Snoring: R06.83

## 2015-07-04 HISTORY — PX: COLONOSCOPY WITH PROPOFOL: SHX5780

## 2015-07-04 SURGERY — COLONOSCOPY WITH PROPOFOL
Anesthesia: Monitor Anesthesia Care | Wound class: Contaminated

## 2015-07-04 MED ORDER — LIDOCAINE HCL (CARDIAC) 20 MG/ML IV SOLN
INTRAVENOUS | Status: DC | PRN
  Administered 2015-07-04: 40 mg via INTRAVENOUS

## 2015-07-04 MED ORDER — LACTATED RINGERS IV SOLN
INTRAVENOUS | Status: DC
  Administered 2015-07-04: 11:00:00 via INTRAVENOUS

## 2015-07-04 MED ORDER — ONDANSETRON HCL 4 MG/2ML IJ SOLN: 4.0000 mg | Freq: Once | INTRAMUSCULAR | Status: DC | PRN

## 2015-07-04 MED ORDER — ACETAMINOPHEN 160 MG/5ML PO SOLN: 325.0000 mg | ORAL | Status: DC | PRN

## 2015-07-04 MED ORDER — STERILE WATER FOR IRRIGATION IR SOLN
Status: DC | PRN
  Administered 2015-07-04: 12:00:00

## 2015-07-04 MED ORDER — ACETAMINOPHEN 325 MG PO TABS: 325.0000 mg | ORAL_TABLET | ORAL | Status: DC | PRN

## 2015-07-04 MED ORDER — PROPOFOL 10 MG/ML IV BOLUS
INTRAVENOUS | Status: DC | PRN
  Administered 2015-07-04 (×2): 20 mg via INTRAVENOUS
  Administered 2015-07-04: 30 mg via INTRAVENOUS
  Administered 2015-07-04 (×4): 20 mg via INTRAVENOUS
  Administered 2015-07-04: 70 mg via INTRAVENOUS
  Administered 2015-07-04: 30 mg via INTRAVENOUS

## 2015-07-04 SURGICAL SUPPLY — 22 items
CANISTER SUCT 1200ML W/VALVE (MISCELLANEOUS) ×3 IMPLANT
CLIP HMST 235XBRD CATH ROT (MISCELLANEOUS) IMPLANT
CLIP RESOLUTION 360 11X235 (MISCELLANEOUS)
FCP ESCP3.2XJMB 240X2.8X (MISCELLANEOUS)
FORCEPS BIOP RAD 4 LRG CAP 4 (CUTTING FORCEPS) IMPLANT
FORCEPS BIOP RJ4 240 W/NDL (MISCELLANEOUS)
FORCEPS ESCP3.2XJMB 240X2.8X (MISCELLANEOUS) IMPLANT
GOWN CVR UNV OPN BCK APRN NK (MISCELLANEOUS) ×2 IMPLANT
GOWN ISOL THUMB LOOP REG UNIV (MISCELLANEOUS) ×6
INJECTOR VARIJECT VIN23 (MISCELLANEOUS) IMPLANT
KIT DEFENDO VALVE AND CONN (KITS) IMPLANT
KIT ENDO PROCEDURE OLY (KITS) ×3 IMPLANT
MARKER SPOT ENDO TATTOO 5ML (MISCELLANEOUS) IMPLANT
PAD GROUND ADULT SPLIT (MISCELLANEOUS) IMPLANT
PROBE APC STR FIRE (PROBE) IMPLANT
SNARE SHORT THROW 13M SML OVAL (MISCELLANEOUS) IMPLANT
SNARE SHORT THROW 30M LRG OVAL (MISCELLANEOUS) IMPLANT
SNARE SNG USE RND 15MM (INSTRUMENTS) IMPLANT
SPOT EX ENDOSCOPIC TATTOO (MISCELLANEOUS)
TRAP ETRAP POLY (MISCELLANEOUS) IMPLANT
VARIJECT INJECTOR VIN23 (MISCELLANEOUS)
WATER STERILE IRR 250ML POUR (IV SOLUTION) ×3 IMPLANT

## 2015-07-04 NOTE — Anesthesia Procedure Notes (Signed)
Procedure Name: MAC Performed by: Tylisa Alcivar Pre-anesthesia Checklist: Patient identified, Emergency Drugs available, Suction available, Timeout performed and Patient being monitored Patient Re-evaluated:Patient Re-evaluated prior to inductionOxygen Delivery Method: Nasal cannula Placement Confirmation: positive ETCO2       

## 2015-07-04 NOTE — Transfer of Care (Signed)
Immediate Anesthesia Transfer of Care Note  Patient: Sharon Small  Procedure(s) Performed: Procedure(s) with comments: COLONOSCOPY WITH PROPOFOL (N/A) - prefers early  Patient Location: PACU  Anesthesia Type: MAC  Level of Consciousness: awake, alert  and patient cooperative  Airway and Oxygen Therapy: Patient Spontanous Breathing and Patient connected to supplemental oxygen  Post-op Assessment: Post-op Vital signs reviewed, Patient's Cardiovascular Status Stable, Respiratory Function Stable, Patent Airway and No signs of Nausea or vomiting  Post-op Vital Signs: Reviewed and stable  Complications: No apparent anesthesia complications

## 2015-07-04 NOTE — H&P (Signed)
[facility]  [address], [place], [place] [address] Phone: [phone] Fax : [phone]  Primary Care Physician:  Wilhemena Durie, MD Primary Gastroenterologist:  Dr. Allen Norris  Pre-Procedure History & Physical: HPI:  Sharon Small is a 65 y.o. female is here for a screening colonoscopy.   Past Medical History  Diagnosis Date  . Hypertension   . GERD (gastroesophageal reflux disease)   . Snores     Past Surgical History  Procedure Laterality Date  . Cataract extraction Bilateral 2003 & 2005  . Vaginal hysterectomy  2005    Bilateral oophorectomy  . Colonoscopy      Prior to Admission medications   Medication Sig Start Date End Date Taking? Authorizing Provider  amLODipine-benazepril (LOTREL) 5-20 MG capsule Take 1 capsule by mouth  daily 04/25/15  Yes Jerrol Banana., MD  aspirin 81 MG tablet Take 1 tablet by mouth daily. 05/29/08  Yes Historical Provider, MD  Biotin 10 MG CAPS Take by mouth.   Yes Historical Provider, MD  bisoprolol-hydrochlorothiazide [facility]) 10-6.25 MG tablet Take 1 tablet by mouth  daily 04/25/15  Yes Richard Maceo Pro., MD  FINACEA 15 % FOAM APPLY TO SKIN BID 08/10/14  Yes Historical Provider, MD  Magnesium 65 MG TABS Take 1 tablet by mouth daily. 04/08/09  Yes Historical Provider, MD  Misc Natural Products (GLUCOSAMINE CHONDROITIN COMPLX) TABS Take 1 tablet by mouth daily.   Yes Historical Provider, MD  Multiple Vitamin (MULTIVITAMIN) tablet Take 1 tablet by mouth daily.   Yes Historical Provider, MD  omeprazole (PRILOSEC) 20 MG capsule Take 20 mg by mouth daily.   Yes Historical Provider, MD  simvastatin (ZOCOR) 20 MG tablet Take 1 tablet by mouth at  bedtime 11/11/14  Yes Richard Maceo Pro., MD  azithromycin [facility]) 250 MG tablet 2 by mouth on day 1 and then 1 by mouth days 2 through 5 Patient not taking: Reported on 06/11/2015 11/04/14   Jerrol Banana., MD  Calcium Carbonate 500 MG CHEW Chew 1 tablet by mouth  daily. Reported on 06/27/2015 05/29/08   Historical Provider, MD  chlorpheniramine-HYDROcodone (TUSSIONEX PENNKINETIC ER) 10-8 MG/5ML SUER Take 5 mLs by mouth every 12 (twelve) hours as needed for cough. Patient not taking: Reported on 06/27/2015 11/04/14   Jerrol Banana., MD    Allergies as of 06/17/2015  . (No Known Allergies)    Family History  Problem Relation Age of Onset  . COPD Mother   . Peripheral Artery Disease Mother   . Osteoporosis Mother   . Heart disease Father     fatal MI age 19  . Hypertension Sister   . Glaucoma Sister   . Breast cancer Maternal Aunt 70    Social History   Social History  . Marital Status: Married    Spouse Name: N/A  . Number of Children: N/A  . Years of Education: N/A   Occupational History  . Not on file.   Social History Main Topics  . Smoking status: Never Smoker   . Smokeless tobacco: Never Used  . Alcohol Use: No  . Drug Use: No  . Sexual Activity: Not Currently   Other Topics Concern  . Not on file   Social History Narrative    Review of Systems: See HPI, otherwise negative ROS  Physical Exam: BP 147/71 mmHg  Temp(Src) 98.1 F (36.7 C)  Resp 16  Ht 5\' 5"  (1.651 m)  Wt 202 lb (91.627 kg)  BMI 33.61 kg/m2  SpO2 99% General:   Alert,  pleasant and cooperative in NAD Head:  Normocephalic and atraumatic. Neck:  Supple; no masses or thyromegaly. Lungs:  Clear throughout to auscultation.    Heart:  Regular rate and rhythm. Abdomen:  Soft, nontender and nondistended. Normal bowel sounds, without guarding, and without rebound.   Neurologic:  Alert and  oriented x4;  grossly normal neurologically.  Impression/Plan: Sharon Small is now here to undergo a screening colonoscopy.  Risks, benefits, and alternatives regarding colonoscopy have been reviewed with the patient.  Questions have been answered.  All parties agreeable.

## 2015-07-04 NOTE — Anesthesia Postprocedure Evaluation (Signed)
Anesthesia Post Note  Patient: Sharon Small  Procedure(s) Performed: Procedure(s) (LRB): COLONOSCOPY WITH PROPOFOL (N/A)  Patient location during evaluation: PACU Anesthesia Type: MAC Level of consciousness: awake and alert Pain management: pain level controlled Vital Signs Assessment: post-procedure vital signs reviewed and stable Respiratory status: spontaneous breathing, nonlabored ventilation, respiratory function stable and patient connected to nasal cannula oxygen Cardiovascular status: stable and blood pressure returned to baseline Anesthetic complications: no    Amaryllis Dyke

## 2015-07-04 NOTE — Anesthesia Preprocedure Evaluation (Signed)
Anesthesia Evaluation  Patient identified by MRN, date of birth, ID band Patient awake    Airway Mallampati: II  TM Distance: >3 FB Neck ROM: full    Dental   Pulmonary    breath sounds clear to auscultation       Cardiovascular hypertension,  Rhythm:regular Rate:Normal     Neuro/Psych    GI/Hepatic GERD  ,  Endo/Other    Renal/GU      Musculoskeletal   Abdominal   Peds  Hematology   Anesthesia Other Findings   Reproductive/Obstetrics                             Anesthesia Physical Anesthesia Plan  ASA: II  Anesthesia Plan: MAC   Post-op Pain Management:    Induction:   Airway Management Planned:   Additional Equipment:   Intra-op Plan:   Post-operative Plan:   Informed Consent: I have reviewed the patients History and Physical, chart, labs and discussed the procedure including the risks, benefits and alternatives for the proposed anesthesia with the patient or authorized representative who has indicated his/her understanding and acceptance.     Plan Discussed with: CRNA  Anesthesia Plan Comments:         Anesthesia Quick Evaluation

## 2015-07-04 NOTE — Op Note (Signed)
[facility] Patient Name: Sharon Small Procedure Date: 07/04/2015 11:30 AM MRN: [id number] Account #: [id number] Date of Birth: March 21, 1950 Admit Type: Outpatient Age: 65 Room: [facility] OR ROOM 01 Gender: Female Note Status: Finalized Procedure:            Colonoscopy Indications:          Screening for colorectal malignant neoplasm Providers:            Lucilla Lame, MD Referring MD:         Janine Ores. Rosanna Randy, MD (Referring MD) Medicines:            Propofol per Anesthesia Complications:        No immediate complications. Procedure:            Pre-Anesthesia Assessment:                       - Prior to the procedure, a History and Physical was                        performed, and patient medications and allergies were                        reviewed. The patient's tolerance of previous                        anesthesia was also reviewed. The risks and benefits of                        the procedure and the sedation options and risks were                        discussed with the patient. All questions were                        answered, and informed consent was obtained. Prior                        Anticoagulants: The patient has taken no previous                        anticoagulant or antiplatelet agents. ASA Grade                        Assessment: II - A patient with mild systemic disease.                        After reviewing the risks and benefits, the patient was                        deemed in satisfactory condition to undergo the                        procedure.                       After obtaining informed consent, the colonoscope was                        passed under direct vision. Throughout the procedure,  the patient's blood pressure, pulse, and oxygen                        saturations were monitored continuously. The Olympus CF                        H180AL colonoscope (S#: [id number]) was introduced  through                        the anus and advanced to the the cecum, identified by                        appendiceal orifice and ileocecal valve. The                        colonoscopy was performed without difficulty. The                        patient tolerated the procedure well. The quality of                        the bowel preparation was excellent. Findings:      The perianal and digital rectal examinations were normal.      Non-bleeding internal hemorrhoids were found during retroflexion. The       hemorrhoids were Grade II (internal hemorrhoids that prolapse but reduce       spontaneously). Impression:           - Non-bleeding internal hemorrhoids.                       - No specimens collected. Recommendation:       - Repeat colonoscopy in 10 years for screening unless                        any change in family history or lower GI problems. Procedure Code(s):    --- Professional ---                       [phone], Colonoscopy, flexible; diagnostic, including                        collection of specimen(s) by brushing or washing, when                        performed (separate procedure) Diagnosis Code(s):    --- Professional ---                       Z12.11, Encounter for screening for malignant neoplasm                        of colon CPT copyright 2016 American [employer] Association. All rights reserved. The codes documented in this report are preliminary and upon coder review may  be revised to meet current compliance requirements. Lucilla Lame, MD 07/04/2015 12:00:06 PM This report has been signed electronically. Number of Addenda: 0 Note Initiated On: 07/04/2015 11:30 AM Scope Withdrawal Time: 0 hours 8 minutes 44 seconds  Total Procedure Duration: 0 hours 14 minutes 2 seconds       [facility]

## 2015-07-07 ENCOUNTER — Encounter: Payer: Self-pay | Admitting: Gastroenterology

## 2015-09-15 ENCOUNTER — Ambulatory Visit (INDEPENDENT_AMBULATORY_CARE_PROVIDER_SITE_OTHER): Payer: 59 | Admitting: Family Medicine

## 2015-09-15 VITALS — BP 128/66 | HR 72 | Temp 97.8°F | Resp 14 | Wt 211.0 lb

## 2015-09-15 DIAGNOSIS — I10 Essential (primary) hypertension: Secondary | ICD-10-CM

## 2015-09-15 DIAGNOSIS — K219 Gastro-esophageal reflux disease without esophagitis: Secondary | ICD-10-CM

## 2015-09-15 DIAGNOSIS — I6521 Occlusion and stenosis of right carotid artery: Secondary | ICD-10-CM | POA: Diagnosis not present

## 2015-09-15 DIAGNOSIS — R739 Hyperglycemia, unspecified: Secondary | ICD-10-CM | POA: Diagnosis not present

## 2015-09-15 DIAGNOSIS — Z7189 Other specified counseling: Secondary | ICD-10-CM | POA: Diagnosis not present

## 2015-09-15 DIAGNOSIS — E785 Hyperlipidemia, unspecified: Secondary | ICD-10-CM

## 2015-09-15 NOTE — Progress Notes (Signed)
.    Subjective:  HPI  Patient saw her dentist on July 27th and had xrays done. She was told it showed possible calcification in carotid arteries. She feels fine and is not having any issues that she knows of. She wanted to follow up on this. She is completely asymptomatic. Prior to Admission medications   Medication Sig Start Date End Date Taking? Authorizing Provider  amLODipine-benazepril (LOTREL) 5-20 MG capsule Take 1 capsule by mouth  daily 04/25/15  Yes Jerrol Banana., MD  aspirin 81 MG tablet Take 1 tablet by mouth daily. 05/29/08  Yes Historical Provider, MD  Biotin 10 MG CAPS Take by mouth.   Yes Historical Provider, MD  bisoprolol-hydrochlorothiazide [facility]) 10-6.25 MG tablet Take 1 tablet by mouth  daily 04/25/15  Yes Richard Maceo Pro., MD  Calcium Carbonate 500 MG CHEW Chew 1 tablet by mouth daily. Reported on 06/27/2015 05/29/08  Yes Historical Provider, MD  FINACEA 15 % FOAM APPLY TO SKIN BID 08/10/14  Yes Historical Provider, MD  Magnesium 65 MG TABS Take 1 tablet by mouth daily. 04/08/09  Yes Historical Provider, MD  Misc Natural Products (GLUCOSAMINE CHONDROITIN COMPLX) TABS Take 1 tablet by mouth daily.   Yes Historical Provider, MD  Multiple Vitamin (MULTIVITAMIN) tablet Take 1 tablet by mouth daily.   Yes Historical Provider, MD  omeprazole (PRILOSEC) 20 MG capsule Take 20 mg by mouth daily.   Yes Historical Provider, MD  simvastatin (ZOCOR) 20 MG tablet Take 1 tablet by mouth at  bedtime 11/11/14  Yes Richard Maceo Pro., MD    Patient Active Problem List   Diagnosis Date Noted  . Special screening for malignant neoplasms, colon   . Borderline diabetes 07/08/2014  . Muscle ache 07/08/2014  . Snores 07/08/2014  . B12 deficiency 07/08/2014  . Malaise and fatigue 12/26/2008  . Allergic rhinitis 05/29/2008  . Acid reflux 05/29/2008  . Essential (primary) hypertension 05/29/2008  . HLD (hyperlipidemia) 05/29/2008  . Adiposity 05/29/2008  . Arthritis,  degenerative 05/29/2008    Past Medical History:  Diagnosis Date  . GERD (gastroesophageal reflux disease)   . Hypertension   . Snores     Social History   Social History  . Marital status: Married    Spouse name: N/A  . Number of children: N/A  . Years of education: N/A   Occupational History  . Not on file.   Social History Main Topics  . Smoking status: Never Smoker  . Smokeless tobacco: Never Used  . Alcohol use No  . Drug use: No  . Sexual activity: Not Currently   Other Topics Concern  . Not on file   Social History Narrative  . No narrative on file    No Known Allergies  Review of Systems  Constitutional: Negative.   HENT: Negative.   Eyes: Negative.   Respiratory: Negative.   Cardiovascular: Negative.   Gastrointestinal: Negative.   Musculoskeletal: Negative.     Immunization History  Administered Date(s) Administered  . Tdap 11/23/2006  . Zoster 07/28/2011   Objective:  BP 128/66   Pulse 72   Temp 97.8 F (36.6 C)   Resp 14   Wt 211 lb (95.7 kg)   BMI 35.11 kg/m   Physical Exam  Constitutional: She is oriented to person, place, and time and well-developed, well-nourished, and in no distress.  HENT:  Head: Normocephalic and atraumatic.  Eyes: Conjunctivae are normal. Pupils are equal, round, and reactive to light.  Neck: Normal  range of motion. Neck supple.  Cardiovascular: Normal rate, regular rhythm, normal heart sounds and intact distal pulses.   No murmur heard. Pulmonary/Chest: Effort normal and breath sounds normal. No respiratory distress. She has no wheezes.  Neurological: She is alert and oriented to person, place, and time. Gait normal.  Psychiatric: Mood, memory, affect and judgment normal.    Lab Results  Component Value Date   WBC 5.2 06/09/2015   HGB 12.8 11/16/2013   HCT 38.1 06/09/2015   PLT 309 06/09/2015   GLUCOSE 93 06/09/2015   CHOL 160 06/09/2015   TRIG 184 (H) 06/09/2015   HDL 50 06/09/2015   LDLCALC 73  06/09/2015   TSH 2.800 06/09/2015   HGBA1C 5.8 (H) 11/15/2014    CMP     Component Value Date/Time   NA 143 06/09/2015 0748   K 4.4 06/09/2015 0748   CL 100 06/09/2015 0748   CO2 26 06/09/2015 0748   GLUCOSE 93 06/09/2015 0748   BUN 15 06/09/2015 0748   CREATININE 0.66 06/09/2015 0748   CALCIUM 9.5 06/09/2015 0748   PROT 6.9 06/09/2015 0748   ALBUMIN 4.4 06/09/2015 0748   AST 21 06/09/2015 0748   ALT 23 06/09/2015 0748   ALKPHOS 60 06/09/2015 0748   BILITOT 0.4 06/09/2015 0748   GFRNONAA 93 06/09/2015 0748   GFRAA 107 06/09/2015 0748    Assessment and Plan :  1. Carotid artery calcification  Possibly. Per recent xray and on the exam. Discussed in details what this means and what needs to be done at this point with patient. She is treated for appropriate things already-B/P and cholesterol is treated.  Patient is asymptomatic today.  For peace of mind will refer to cardiologist for further treatment if patient needs it. Patient needs to work on habits. 2. Essential (primary) hypertension Stable.  3. Gastroesophageal reflux disease, esophagitis presence not specified Stable.  4. HLD (hyperlipidemia) Stable. 5. Overweight Dietary changes and exercise habits discussed at length. Patient was seen and examined by Dr. Eulas Post and note was scribed by Theressa Millard, RMA.    Miguel Aschoff MD [facility] 09/15/2015 3:47 PM

## 2015-09-17 LAB — COMPREHENSIVE METABOLIC PANEL
ALBUMIN: 4.3 g/dL (ref 3.6–4.8)
ALK PHOS: 65 IU/L (ref 39–117)
ALT: 23 IU/L (ref 0–32)
AST: 20 IU/L (ref 0–40)
Albumin/Globulin Ratio: 1.7 (ref 1.2–2.2)
BILIRUBIN TOTAL: 0.5 mg/dL (ref 0.0–1.2)
BUN / CREAT RATIO: 21 (ref 12–28)
BUN: 15 mg/dL (ref 8–27)
CHLORIDE: 102 mmol/L (ref 96–106)
CO2: 25 mmol/L (ref 18–29)
CREATININE: 0.7 mg/dL (ref 0.57–1.00)
Calcium: 9.4 mg/dL (ref 8.7–10.3)
GFR calc non Af Amer: 91 mL/min/{1.73_m2} (ref >59)
GFR, EST AFRICAN AMERICAN: 104 mL/min/{1.73_m2} (ref >59)
GLOBULIN, TOTAL: 2.6 g/dL (ref 1.5–4.5)
Glucose: 95 mg/dL (ref 65–99)
Potassium: 4.5 mmol/L (ref 3.5–5.2)
SODIUM: 143 mmol/L (ref 134–144)
TOTAL PROTEIN: 6.9 g/dL (ref 6.0–8.5)

## 2015-09-17 LAB — LIPID PANEL WITH LDL/HDL RATIO
Cholesterol, Total: 172 mg/dL (ref 100–199)
HDL: 49 mg/dL (ref >39)
LDL CALC: 95 mg/dL (ref 0–99)
LDl/HDL Ratio: 1.9 ratio units (ref 0.0–3.2)
TRIGLYCERIDES: 139 mg/dL (ref 0–149)
VLDL Cholesterol Cal: 28 mg/dL (ref 5–40)

## 2015-09-17 LAB — HEMOGLOBIN A1C
Est. average glucose Bld gHb Est-mCnc: 117 mg/dL
HEMOGLOBIN A1C: 5.7 % — AB (ref 4.8–5.6)

## 2015-09-17 LAB — HIGH SENSITIVITY CRP: CRP, High Sensitivity: 3.63 mg/L — ABNORMAL HIGH (ref 0.00–3.00)

## 2015-09-17 LAB — TSH: TSH: 3.28 u[IU]/mL (ref 0.450–4.500)

## 2015-09-18 ENCOUNTER — Telehealth: Payer: Self-pay | Admitting: Family Medicine

## 2015-09-18 NOTE — Telephone Encounter (Signed)
Pt is returning call.  [id number]

## 2015-11-10 ENCOUNTER — Ambulatory Visit (INDEPENDENT_AMBULATORY_CARE_PROVIDER_SITE_OTHER): Payer: 59 | Admitting: Family Medicine

## 2015-11-10 VITALS — BP 110/68 | HR 60 | Temp 97.5°F | Wt 209.0 lb

## 2015-11-10 DIAGNOSIS — I10 Essential (primary) hypertension: Secondary | ICD-10-CM

## 2015-11-10 NOTE — Progress Notes (Signed)
Sharon Small  MRN: [id number] DOB: [birth year]  Subjective:  HPI   The patient is a 65 year old female employee of [employer].  She presents today to get paperwork filled out for an appeal on her health screening results.  She did not meet the requirements for her BMI.   The patient has taken steps to work on improving the BMI.  She has recently joined [employer] for weight reduction and plans on working toward an exercise regiment.    Waist measurement today is 40 inches  Patient Active Problem List   Diagnosis Date Noted  . Special screening for malignant neoplasms, colon   . Borderline diabetes 07/08/2014  . Muscle ache 07/08/2014  . Snores 07/08/2014  . B12 deficiency 07/08/2014  . Malaise and fatigue 12/26/2008  . Allergic rhinitis 05/29/2008  . Acid reflux 05/29/2008  . Essential (primary) hypertension 05/29/2008  . HLD (hyperlipidemia) 05/29/2008  . Adiposity 05/29/2008  . Arthritis, degenerative 05/29/2008    Past Medical History:  Diagnosis Date  . GERD (gastroesophageal reflux disease)   . Hypertension   . Snores     Social History   Social History  . Marital status: Married    Spouse name: N/A  . Number of children: N/A  . Years of education: N/A   Occupational History  . Not on file.   Social History Main Topics  . Smoking status: Never Smoker  . Smokeless tobacco: Never Used  . Alcohol use No  . Drug use: No  . Sexual activity: Not Currently   Other Topics Concern  . Not on file   Social History Narrative  . No narrative on file    Outpatient Encounter Prescriptions as of 11/10/2015  Medication Sig Note  . amLODipine-benazepril (LOTREL) 5-20 MG capsule Take 1 capsule by mouth  daily   . aspirin 81 MG tablet Take 1 tablet by mouth daily. 07/08/2014: Received from: [facility]: ASPIRIN EC, 81MG  (Oral Tablet Delayed Release)  1 Every Day for 0 days  Quantity: 0.00;  Refills: 0   Ordered :13-Feb-2010  Althea Charon ;  Started 29-May-2008 Active  . Biotin 10 MG CAPS Take by mouth.   . bisoprolol-hydrochlorothiazide (ZIAC) 10-6.25 MG tablet Take 1 tablet by mouth  daily   . Calcium Carbonate 500 MG CHEW Chew 1 tablet by mouth daily. Reported on 06/27/2015 07/08/2014: Received from: [place]: CALCIUM 500, 500MG  (Oral Tablet)  dose not in chart check with patient for 0 days  Quantity: 0.00;  Refills: 0   Ordered :13-Feb-2010  Althea Charon ;  Started 29-May-2008 Active  . FINACEA 15 % FOAM APPLY TO SKIN BID 09/16/2014: Received from: External Pharmacy  . Magnesium 65 MG TABS Take 1 tablet by mouth daily. 07/08/2014: Received from: [place]: MAGNESIUM, 65MG  (Oral Tablet)  1 Every Day for 0 days  Quantity: 0.00;  Refills: 0   Ordered :13-Feb-2010  Ashley Royalty ;  Started 08-Apr-2009 Active  . Misc Natural Products (GLUCOSAMINE CHONDROITIN COMPLX) TABS Take 1 tablet by mouth daily. 07/08/2014: Received from: [place]:   . Multiple Vitamin (MULTIVITAMIN) tablet Take 1 tablet by mouth daily.   Marland Kitchen omeprazole (PRILOSEC) 20 MG capsule Take 20 mg by mouth daily.   . simvastatin (ZOCOR) 20 MG tablet Take 1 tablet by mouth at  bedtime    No facility-administered encounter medications on file as of 11/10/2015.     No Known Allergies  Review of Systems  Constitutional: Negative for fever and malaise/fatigue.  Respiratory: Negative for cough, shortness of breath and wheezing.   Cardiovascular: Negative for chest pain, palpitations and leg swelling.  Neurological: Negative for dizziness, weakness and headaches.   Objective:  BP 110/68   Pulse 60   Temp 97.5 F (36.4 C) (Oral)   Wt 209 lb (94.8 kg)   BMI 34.78 kg/m   Physical Exam  Constitutional: She is well-developed, well-nourished, and in no distress.  HENT:  Head: Normocephalic and atraumatic.  Right Ear: External ear normal.  Left Ear: External ear normal.   Nose: Nose normal.  Eyes: Conjunctivae are normal. Pupils are equal, round, and reactive to light. No scleral icterus.  Neck: Normal range of motion. Neck supple. No thyromegaly present.  Cardiovascular: Normal rate, regular rhythm and normal heart sounds.   Pulmonary/Chest: Effort normal and breath sounds normal.  Abdominal: Soft.  Musculoskeletal: She exhibits edema (trace).  Lymphadenopathy:    She has no cervical adenopathy.  Skin: Skin is warm and dry.  Psychiatric: Mood, memory, affect and judgment normal.    Assessment and Plan :  Mild obesity Patient is slightly overweight. She has very large breasts and this greatly contributes to her BMI. I think her waist today measures at 37.5 inches and her  goal is 35 inch waist Hypertension GERD I have done the exam and reviewed the chart and it is accurate to the best of my knowledge. Miguel Aschoff M.D. [facility]

## 2015-11-17 ENCOUNTER — Encounter: Payer: Self-pay | Admitting: Cardiovascular Disease

## 2015-11-17 ENCOUNTER — Ambulatory Visit (INDEPENDENT_AMBULATORY_CARE_PROVIDER_SITE_OTHER): Payer: 59 | Admitting: Cardiovascular Disease

## 2015-11-17 VITALS — BP 130/86 | HR 60 | Ht 65.5 in | Wt 207.5 lb

## 2015-11-17 DIAGNOSIS — Z23 Encounter for immunization: Secondary | ICD-10-CM | POA: Diagnosis not present

## 2015-11-17 DIAGNOSIS — I6529 Occlusion and stenosis of unspecified carotid artery: Secondary | ICD-10-CM

## 2015-11-17 DIAGNOSIS — E7800 Pure hypercholesterolemia, unspecified: Secondary | ICD-10-CM | POA: Diagnosis not present

## 2015-11-17 DIAGNOSIS — Z8249 Family history of ischemic heart disease and other diseases of the circulatory system: Secondary | ICD-10-CM

## 2015-11-17 DIAGNOSIS — I10 Essential (primary) hypertension: Secondary | ICD-10-CM

## 2015-11-17 NOTE — Progress Notes (Signed)
Cardiology Office Note  Date:  11/17/2015   ID:  KIYONA SCHECHTER, DOB September 15, 1950, MRN [id number]  PCP:  Wilhemena Durie, MD   Chief Complaint  Patient presents with  . other    Ref by Dr. Rosanna Randy for cardiac eval. Meds reviewed by the pt. verbally.     HPI:  65 year old female with strong family history of coronary artery disease, history of hypertension, hyperlipidemia, patient of Dr. Rosanna Randy, recent seen by the dentist with panoramic x-rays, some concern of carotid calcification based on these x-rays She presents by referral from Dr. Rosanna Randy for consultation of the x-ray and PAD risk factors  X-ray was reviewed, calcifications not particularly clear No other imaging studies available for review including no CT scan or ultrasounds She does report being on simvastatin for many years for preventative therapy. Continues to work at [employer] no regular exercise program Trying to work on her diet but has not been doing well in the past 2 years  Lab work reviewed showing climb in total cholesterol, LDL Most recent LDL 95, total cholesterol 172 Hemoglobin A1c 5.7, fasting glucose 95 CRP 3.63  She denies any significant smoking history, no diabetes  No chest pain or shortness of breath on exertion  EKG on today's visit shows normal sinus rhythm with rate 60 bpm, no significant ST or T-wave changes  Dad was smoker, died 36  PMH:   has a past medical history of GERD (gastroesophageal reflux disease); Hypertension; and Snores.  PSH:    Past Surgical History:  Procedure Laterality Date  . CATARACT EXTRACTION Bilateral 2003 & 2005  . COLONOSCOPY    . COLONOSCOPY WITH PROPOFOL N/A 07/04/2015   Procedure: COLONOSCOPY WITH PROPOFOL;  Surgeon: Lucilla Lame, MD;  Location: [facility];  Service: Endoscopy;  Laterality: N/A;  prefers early  . VAGINAL HYSTERECTOMY  2005   Bilateral oophorectomy    Current Outpatient Prescriptions  Medication Sig Dispense Refill  .  amLODipine-benazepril (LOTREL) 5-20 MG capsule Take 1 capsule by mouth  daily 90 capsule 2  . aspirin 81 MG tablet Take 1 tablet by mouth daily.    . Biotin 10 MG CAPS Take by mouth.    . bisoprolol-hydrochlorothiazide (ZIAC) 10-6.25 MG tablet Take 1 tablet by mouth  daily 90 tablet 2  . FINACEA 15 % FOAM APPLY TO SKIN BID  11  . Magnesium 65 MG TABS Take 1 tablet by mouth daily.    . Misc Natural Products (GLUCOSAMINE CHONDROITIN COMPLX) TABS Take 1 tablet by mouth daily.    . Multiple Vitamin (MULTIVITAMIN) tablet Take 1 tablet by mouth daily.    Marland Kitchen omeprazole (PRILOSEC) 20 MG capsule Take 20 mg by mouth daily.    . simvastatin (ZOCOR) 20 MG tablet Take 1 tablet by mouth at  bedtime 90 tablet 3   No current facility-administered medications for this visit.      Allergies:   Review of patient's allergies indicates no known allergies.   Social History:  The patient  reports that she has never smoked. She has never used smokeless tobacco. She reports that she does not drink alcohol or use drugs.   Family History:   family history includes Breast cancer (age of onset: 51) in her maternal aunt; COPD in her mother; Glaucoma in her sister; Heart disease in her father; Hypertension in her sister; Osteoporosis in her mother; Peripheral Artery Disease in her mother.    Review of Systems: Review of Systems  Constitutional: Negative.   Respiratory:  Negative.   Cardiovascular: Negative.   Gastrointestinal: Negative.   Musculoskeletal: Negative.   Neurological: Negative.   Psychiatric/Behavioral: Negative.   All other systems reviewed and are negative.    PHYSICAL EXAM: VS:  BP 130/86 (BP Location: Left Arm, Patient Position: Sitting, Cuff Size: Normal)   Pulse 60   Ht 5' 5.5" (1.664 m)   Wt 207 lb 8 oz (94.1 kg)   BMI 34.00 kg/m  , BMI Body mass index is 34 kg/m. GEN: Well nourished, well developed, in no acute distress, obese  HEENT: normal  Neck: no JVD, carotid bruits, or  masses Cardiac: RRR; no murmurs, rubs, or gallops,no edema  Respiratory:  clear to auscultation bilaterally, normal work of breathing GI: soft, nontender, nondistended, + BS MS: no deformity or atrophy  Skin: warm and dry, no rash Neuro:  Strength and sensation are intact Psych: euthymic mood, full affect    Recent Labs: 06/09/2015: Platelets 309 09/16/2015: ALT 23; BUN 15; Creatinine, Ser 0.70; Potassium 4.5; Sodium 143; TSH 3.280    Lipid Panel Lab Results  Component Value Date   CHOL 172 09/16/2015   HDL 49 09/16/2015   LDLCALC 95 09/16/2015   TRIG 139 09/16/2015      Wt Readings from Last 3 Encounters:  11/17/15 207 lb 8 oz (94.1 kg)  11/10/15 209 lb (94.8 kg)  09/15/15 211 lb (95.7 kg)       ASSESSMENT AND PLAN:  Essential (primary) hypertension - Plan: EKG 12-Lead, CT CARDIAC SCORING, Lipoprotein A (LPA), NMR, lipoprofile Blood pressure is well controlled on today's visit. No changes made to the medications.  Visit for preventive care Various options were discussed given her history of coronary disease, premature One option was to do nothing and to continue current management, second option was to be more aggressive with her cholesterol level, third option was to perform a carotid ultrasound for screening, last option was CT coronary calcium scoring. The above were discussed with her in detail. After long discussion, she elected to go with CT coronary calcium scoring. This will be scheduled at her convenience  Encounter for immunization - Plan: Flu Vaccine QUAD 36+ mos IM Had flu shot today  Family history of premature CAD - Plan: CT CARDIAC SCORING, Lipoprotein A (LPA), NMR, lipoprofile Father died of heart attack in his 88s but was a smoker  Pure hypercholesterolemia Risk stratification with CT coronary calcium scoring If scores elevated, would be more aggressive with her cholesterol Repeat lab work ordered for several months from now, NMR lipo  profile  Carotid artery calcification, unspecified laterality Unclear if she has carotid calcification based on dental panoramic x-ray Recommended she have CT coronary calcium scoring for risk stratification as above   Total encounter time more than 45 minutes  Greater than 50% was spent in counseling and coordination of care with the patient  Disposition:   F/U  prn   Orders Placed This Encounter  Procedures  . CT CARDIAC SCORING  . Flu Vaccine QUAD 36+ mos IM  . Lipoprotein A (LPA)  . NMR, lipoprofile  . EKG 12-Lead     Signed, Esmond Plants, M.D., Ph.D. 11/17/2015  [place], [place]

## 2015-11-17 NOTE — Patient Instructions (Addendum)
Medication Instructions:   No medication changes made  Labwork:  Please take orders to [facility] for: Lipoprofile NMR & Lipoprotein (a)  Testing/Procedures:  We will order a CT coronary calcium score for family hx, possible PAD on dental xray There is a one-time fee of $150 due at the time of your procedure [address], [place]  Follow-Up: It was a pleasure seeing you in the office today. Please call us if you have new issues that need to be addressed before your next appt.  [phone]  Your physician wants you to follow-up in: 12 months.  You will receive a reminder letter in the mail two months in advance. If you don't receive a letter, please call our office to schedule the follow-up appointment.  If you need a refill on your cardiac medications before your next appointment, please call your pharmacy.    Coronary Calcium Scan A coronary calcium scan is an imaging test used to look for deposits of calcium and other fatty materials (plaques) in the inner lining of the blood vessels of your heart (coronary arteries). These deposits of calcium and plaques can partly clog and narrow the coronary arteries without producing any symptoms or warning signs. This puts you at risk for a heart attack. This test can detect these deposits before symptoms develop.  LET [facility] CARE PROVIDER KNOW ABOUT:  Any allergies you have.  All medicines you are taking, including vitamins, herbs, eye drops, creams, and over-the-counter medicines.  Previous problems you or members of your family have had with the use of anesthetics.  Any blood disorders you have.  Previous surgeries you have had.  Medical conditions you have.  Possibility of pregnancy, if this applies. RISKS AND COMPLICATIONS Generally, this is a safe procedure. However, as with any procedure, complications can occur. This test involves the use of radiation. Radiation exposure can be dangerous  to a pregnant woman and her unborn baby. If you are pregnant, you should not have this procedure done.  BEFORE THE PROCEDURE There is no special preparation for the procedure. PROCEDURE  You will need to undress and put on a hospital gown. You will need to remove any jewelry around your neck or chest.  Sticky electrodes are placed on your chest and are connected to an electrocardiogram (EKG or electrocardiography) machine to recorda tracing of the electrical activity of your heart.  A CT scanner will take pictures of your heart. During this time, you will be asked to lie still and hold your breath for 2-3 seconds while a picture is being taken of your heart. AFTER THE PROCEDURE   You will be allowed to get dressed.  You can return to your normal activities after the scan is done.   This information is not intended to replace advice given to you by your health care provider. Make sure you discuss any questions you have with your health care provider.   Document Released: 07/31/2007 Document Revised: 02/06/2013 Document Reviewed: 10/09/2012 Elsevier Interactive Patient Education 2016 Elsevier Inc. Lipoproteins Test WHY AM I HAVING THIS TEST? This test measures lipoproteins in your blood. Lipoproteins transport cholesterol, triglycerides, and other fats to and from your tissues and liver. Lipoprotein blood levels are used to predict your risk for coronary artery disease (CAD). Low-density lipoproteins (LDL) carry cholesterol from the liver and deposit it in the cells of your body. These are sometimes called "bad cholesterol." Having high levels of LDL increases your risk for CAD. Very low-density lipoproteins (  VLDL) carry triglycerides through the bloodstream and deposit them in tissues. A high level of VLDL also increases your risk for CAD. High-density lipoproteins (HDL) collect cholesterol from your body's tissues and carry it to your liver. HDL is sometimes called "good cholesterol."  Having high levels of HDL decreases your risk for CAD. When all types of lipoproteins are measured together, the test is called a lipid profile test. It allows your health care provider to determine if you are at risk for heart disease or other blood vessel problems. WHAT KIND OF SAMPLE IS TAKEN? A blood sample is required for this test. It is usually collected by inserting a needle into a vein or by sticking a finger with a small needle. HOW DO I PREPARE FOR THE TEST? You may be asked to avoid eating or drinking anything except water for 12-14 hours before the test. WHAT ARE THE REFERENCE RANGES? Reference ranges are considered healthy ranges established after testing a large group of healthy people. Reference ranges may vary among different people, labs, and hospitals. It is your responsibility to obtain your test results. Ask the lab or department performing the test when and how you will get your results. Reference ranges for different types of lipoprotein values are as follows:  HDL:  Female: greater than 45 mg/dL or greater than 0.75 mmol/L (SI units).  Female: greater than 55 mg/dL or greater than 0.91 mmol/L (SI units).  LDL:  Adult: less than 130 mg/dL.  Children: less than 110 mg/dL.  VLDL: 7-32 mg/dL. WHAT DO THE RESULTS MEAN? Results outside the reference range can mean you are at increased risk for CAD. The following is your risk for heart disease according to HDL levels:  Low:  Men: greater than 60 mg/dL.  Women: greater than 70 mg/dL.  Moderate:  Men: 45-59 mg/dL.  Women: 55-69 mg/dL.  High:  Men: 25-44 mg/dL.  Women: 35-54 mg/dL. Talk with your health care provider to discuss your results, treatment options, and if necessary, the need for more tests. Talk with your health care provider if you have any questions about your results.   This information is not intended to replace advice given to you by your health care provider. Make sure you discuss any  questions you have with your health care provider.   Document Released: 03/06/2004 Document Revised: 02/22/2014 Document Reviewed: 06/22/2013 Elsevier Interactive Patient Education [employer].

## 2015-11-20 ENCOUNTER — Other Ambulatory Visit: Payer: Self-pay | Admitting: Family Medicine

## 2015-12-04 ENCOUNTER — Other Ambulatory Visit: Payer: Self-pay | Admitting: Family Medicine

## 2015-12-04 DIAGNOSIS — Z1231 Encounter for screening mammogram for malignant neoplasm of breast: Secondary | ICD-10-CM

## 2015-12-08 ENCOUNTER — Encounter: Payer: Self-pay | Admitting: Family Medicine

## 2015-12-08 ENCOUNTER — Ambulatory Visit (INDEPENDENT_AMBULATORY_CARE_PROVIDER_SITE_OTHER): Payer: 59 | Admitting: Family Medicine

## 2015-12-08 VITALS — BP 120/80 | HR 64 | Temp 98.7°F | Resp 16 | Wt 206.0 lb

## 2015-12-08 DIAGNOSIS — R0989 Other specified symptoms and signs involving the circulatory and respiratory systems: Secondary | ICD-10-CM

## 2015-12-08 DIAGNOSIS — R05 Cough: Secondary | ICD-10-CM | POA: Diagnosis not present

## 2015-12-08 DIAGNOSIS — J410 Simple chronic bronchitis: Secondary | ICD-10-CM | POA: Diagnosis not present

## 2015-12-08 DIAGNOSIS — R059 Cough, unspecified: Secondary | ICD-10-CM

## 2015-12-08 MED ORDER — LEVALBUTEROL HCL 1.25 MG/0.5ML IN NEBU
1.2500 mg | INHALATION_SOLUTION | Freq: Once | RESPIRATORY_TRACT | Status: AC
  Administered 2015-12-08: 1.25 mg via RESPIRATORY_TRACT

## 2015-12-08 MED ORDER — AZITHROMYCIN 250 MG PO TABS: ORAL_TABLET | ORAL | 0 refills | Status: DC

## 2015-12-08 MED ORDER — HYDROCOD POLST-CPM POLST ER 10-8 MG/5ML PO SUER: 5.0000 mL | Freq: Two times a day (BID) | ORAL | 0 refills | Status: DC | PRN

## 2015-12-08 NOTE — Progress Notes (Signed)
Patient: Sharon Small Female    DOB: 08/08/1950   65 y.o.   MRN: [id number] Visit Date: 12/08/2015  Today's Provider: Wilhemena Durie, MD   Chief Complaint  Patient presents with  . URI   Subjective:    URI   This is a new problem. The current episode started in the past 7 days (x 5 days). The problem has been gradually worsening. There has been no fever. Associated symptoms include congestion, coughing (dry), headaches, a plugged ear sensation, rhinorrhea, sneezing and wheezing. Pertinent negatives include no abdominal pain, chest pain, diarrhea, dysuria, ear pain, joint pain, nausea, neck pain, sinus pain, sore throat, swollen glands or vomiting. Treatments tried: Mucinex and Delsym.       No Known Allergies   Current Outpatient Prescriptions:  .  amLODipine-benazepril (LOTREL) 5-20 MG capsule, Take 1 capsule by mouth  daily, Disp: 90 capsule, Rfl: 2 .  aspirin 81 MG tablet, Take 1 tablet by mouth daily., Disp: , Rfl:  .  Biotin 10 MG CAPS, Take by mouth., Disp: , Rfl:  .  bisoprolol-hydrochlorothiazide (ZIAC) 10-6.25 MG tablet, Take 1 tablet by mouth  daily, Disp: 90 tablet, Rfl: 2 .  FINACEA 15 % FOAM, APPLY TO SKIN BID, Disp: , Rfl: 11 .  Magnesium 65 MG TABS, Take 1 tablet by mouth daily., Disp: , Rfl:  .  Misc Natural Products (GLUCOSAMINE CHONDROITIN COMPLX) TABS, Take 1 tablet by mouth daily., Disp: , Rfl:  .  Multiple Vitamin (MULTIVITAMIN) tablet, Take 1 tablet by mouth daily., Disp: , Rfl:  .  omeprazole (PRILOSEC) 20 MG capsule, Take 20 mg by mouth daily., Disp: , Rfl:  .  simvastatin (ZOCOR) 20 MG tablet, TAKE 1 TABLET BY MOUTH AT  BEDTIME, Disp: 90 tablet, Rfl: 3  Review of Systems  Constitutional: Positive for fatigue. Negative for fever.  HENT: Positive for congestion, rhinorrhea and sneezing. Negative for ear pain and sore throat.   Eyes: Negative.   Respiratory: Positive for cough (dry) and wheezing.   Cardiovascular: Negative for chest pain.    Gastrointestinal: Negative for abdominal pain, diarrhea, nausea and vomiting.  Endocrine: Negative.   Genitourinary: Negative for dysuria.  Musculoskeletal: Negative for joint pain and neck pain.  Allergic/Immunologic: Negative.   Neurological: Positive for headaches.  Psychiatric/Behavioral: Negative.     Social History  Substance Use Topics  . Smoking status: Never Smoker  . Smokeless tobacco: Never Used  . Alcohol use No   Objective:   BP 120/80 (BP Location: Left Arm, Patient Position: Sitting, Cuff Size: Large)   Pulse 64   Temp 98.7 F (37.1 C) (Oral)   Resp 16   Wt 206 lb (93.4 kg)   SpO2 97%   BMI 33.76 kg/m   Physical Exam  Constitutional: She is oriented to person, place, and time. She appears well-developed and well-nourished.  HENT:  Head: Normocephalic and atraumatic.  Right Ear: External ear normal.  Left Ear: Tympanic membrane and external ear normal.  Nose: Nose normal.  Mouth/Throat: Oropharynx is clear and moist.  Eyes: Conjunctivae are normal. Pupils are equal, round, and reactive to light. Right eye exhibits no discharge. Left eye exhibits no discharge.  Neck: Normal range of motion. Neck supple. No tracheal deviation present. No thyromegaly present.  Cardiovascular: Normal rate, regular rhythm and normal heart sounds.   Pulmonary/Chest: Breath sounds normal.  Diffuse expiratory rhonchi; cleared after Xopenex treatment  Abdominal: Soft.  Lymphadenopathy:    She has no  cervical adenopathy.  Neurological: She is alert and oriented to person, place, and time.  Skin: Skin is warm and dry.  Psychiatric: She has a normal mood and affect. Her behavior is normal. Judgment and thought content normal.        Assessment & Plan:     1. Rhonchi Improved after treatment. - levalbuterol (XOPENEX) nebulizer solution 1.25 mg; Take 1.25 mg by nebulization once.  2. Cough Start Tussionex for sleep. - chlorpheniramine-HYDROcodone (TUSSIONEX PENNKINETIC ER)  10-8 MG/5ML SUER; Take 5 mLs by mouth every 12 (twelve) hours as needed for cough.  Dispense: 140 mL; Refill: 0  3. Simple chronic bronchitis ([place]) Start abx. Call if sx fail to improve or worsen. - azithromycin (ZITHROMAX) 250 MG tablet; Take 2 tabs po on first day, then 1 tablet each remaining day until gone.  Dispense: 6 tablet; Refill: 0     Patient seen and examined by Miguel Aschoff, MD, and note scribed by Renaldo Fiddler, CMA.  I have done the exam and reviewed the above chart and it is accurate to the best of my knowledge.  Richard Cranford Mon, MD  [facility]

## 2015-12-09 ENCOUNTER — Ambulatory Visit: Payer: 59 | Admitting: Family Medicine

## 2015-12-11 ENCOUNTER — Inpatient Hospital Stay: Admission: RE | Admit: 2015-12-11 | Payer: 59 | Source: Ambulatory Visit

## 2016-01-12 ENCOUNTER — Ambulatory Visit
Admission: RE | Admit: 2016-01-12 | Discharge: 2016-01-12 | Disposition: A | Discharge status: Home / Self Care | Payer: 59 | Source: Ambulatory Visit | Attending: Family Medicine | Admitting: Family Medicine

## 2016-01-12 DIAGNOSIS — Z1231 Encounter for screening mammogram for malignant neoplasm of breast: Secondary | ICD-10-CM | POA: Diagnosis present

## 2016-01-23 ENCOUNTER — Encounter: Payer: Self-pay | Admitting: Physician Assistant

## 2016-01-23 ENCOUNTER — Ambulatory Visit (INDEPENDENT_AMBULATORY_CARE_PROVIDER_SITE_OTHER): Payer: 59 | Admitting: Physician Assistant

## 2016-01-23 VITALS — BP 110/72 | HR 68 | Temp 98.3°F | Resp 16 | Wt 209.0 lb

## 2016-01-23 DIAGNOSIS — N3090 Cystitis, unspecified without hematuria: Secondary | ICD-10-CM | POA: Diagnosis not present

## 2016-01-23 DIAGNOSIS — R300 Dysuria: Secondary | ICD-10-CM

## 2016-01-23 LAB — POCT URINALYSIS DIPSTICK
BILIRUBIN UA: NEGATIVE
GLUCOSE UA: NEGATIVE
KETONES UA: NEGATIVE
Nitrite, UA: NEGATIVE
SPEC GRAV UA: 1.015
Urobilinogen, UA: 0.2
pH, UA: 6

## 2016-01-23 MED ORDER — SULFAMETHOXAZOLE-TRIMETHOPRIM 800-160 MG PO TABS: 1.0000 | ORAL_TABLET | Freq: Two times a day (BID) | ORAL | 0 refills | Status: DC

## 2016-01-23 NOTE — Progress Notes (Signed)
Patient: Sharon Small Female    DOB: 1950/12/19   65 y.o.   MRN: [id number] Visit Date: 01/23/2016  Today's Provider: Mar Daring, PA-C   Chief Complaint  Patient presents with  . Urinary Tract Infection   Subjective:    Urinary Tract Infection   This is a new problem. The current episode started yesterday. The problem has been unchanged. The quality of the pain is described as aching. There has been no fever. She is sexually active. There is no history of pyelonephritis. Associated symptoms include frequency, nausea and urgency. Pertinent negatives include no chills, discharge, flank pain, hematuria, hesitancy, possible pregnancy, sweats or vomiting. Associated symptoms comments: Low back pain, pt reports it feels like she's not emptying completely, abdominal pain. She has tried increased fluids for the symptoms.      No Known Allergies   Current Outpatient Prescriptions:  .  amLODipine-benazepril (LOTREL) 5-20 MG capsule, Take 1 capsule by mouth  daily, Disp: 90 capsule, Rfl: 2 .  aspirin 81 MG tablet, Take 1 tablet by mouth daily., Disp: , Rfl:  .  Biotin 10 MG CAPS, Take by mouth., Disp: , Rfl:  .  bisoprolol-hydrochlorothiazide (ZIAC) 10-6.25 MG tablet, Take 1 tablet by mouth  daily, Disp: 90 tablet, Rfl: 2 .  FINACEA 15 % FOAM, APPLY TO SKIN BID, Disp: , Rfl: 11 .  Magnesium 65 MG TABS, Take 1 tablet by mouth daily., Disp: , Rfl:  .  Misc Natural Products (GLUCOSAMINE CHONDROITIN COMPLX) TABS, Take 1 tablet by mouth daily., Disp: , Rfl:  .  Multiple Vitamin (MULTIVITAMIN) tablet, Take 1 tablet by mouth daily., Disp: , Rfl:  .  omeprazole (PRILOSEC) 20 MG capsule, Take 20 mg by mouth daily., Disp: , Rfl:  .  simvastatin (ZOCOR) 20 MG tablet, TAKE 1 TABLET BY MOUTH AT  BEDTIME, Disp: 90 tablet, Rfl: 3  Review of Systems  Constitutional: Negative for chills and fever.  Respiratory: Negative.   Cardiovascular: Negative.   Gastrointestinal: Positive for  abdominal pain and nausea. Negative for vomiting.  Genitourinary: Positive for dysuria, frequency and urgency. Negative for flank pain, hematuria and hesitancy.  Musculoskeletal: Positive for back pain.    Social History  Substance Use Topics  . Smoking status: Never Smoker  . Smokeless tobacco: Never Used  . Alcohol use No   Objective:   BP 110/72 (BP Location: Left Arm, Patient Position: Sitting, Cuff Size: Large)   Pulse 68   Temp 98.3 F (36.8 C) (Oral)   Resp 16   Wt 209 lb (94.8 kg)   BMI 34.25 kg/m   Physical Exam  Constitutional: She is oriented to person, place, and time. She appears well-developed and well-nourished. No distress.  Cardiovascular: Normal rate, regular rhythm and normal heart sounds.  Exam reveals no gallop and no friction rub.   No murmur heard. Pulmonary/Chest: Effort normal and breath sounds normal. No respiratory distress. She has no wheezes. She has no rales.  Abdominal: Soft. Normal appearance and bowel sounds are normal. She exhibits no distension and no mass. There is no hepatosplenomegaly. There is tenderness in the suprapubic area. There is no rebound, no guarding and no CVA tenderness.  Suprapubic pressure  Neurological: She is alert and oriented to person, place, and time.  Skin: Skin is warm and dry. She is not diaphoretic.  Vitals reviewed.     Assessment & Plan:     1. Dysuria UA was positive for pyuria and NH trace  hematuria. - POCT urinalysis dipstick  2. Cystitis I will treat urinary tract infection empirically with Bactrim as below. I will send urine for culture and will adjust antibiotic therapy if needed pending culture and sensitivity results. Patient is to push fluids and may use cranberry juice for dysuria and spasm. She is to call the office if symptoms fail to improve. - Urine culture - sulfamethoxazole-trimethoprim (BACTRIM DS,SEPTRA DS) 800-160 MG tablet; Take 1 tablet by mouth 2 (two) times daily.  Dispense: 14 tablet;  Refill: 0     Patient seen and examined by Mar Daring, PA-C, and note scribed by Renaldo Fiddler, CMA.   Mar Daring, PA-C  [facility]

## 2016-01-23 NOTE — Patient Instructions (Signed)

## 2016-01-26 ENCOUNTER — Telehealth: Payer: Self-pay

## 2016-01-26 LAB — URINE CULTURE

## 2016-01-26 NOTE — Telephone Encounter (Signed)
Advised pt of lab results. Pt verbally acknowledges understanding. Emily Drozdowski, CMA   

## 2016-01-26 NOTE — Telephone Encounter (Signed)
-----   Message from Mar Daring, PA-C sent at 01/26/2016  8:33 AM EST ----- Urine culture was positive and susceptible to Bactrim. Continue until completed and call if symptoms fail to improve.

## 2016-03-08 ENCOUNTER — Other Ambulatory Visit: Payer: Self-pay | Admitting: Family Medicine

## 2016-09-21 ENCOUNTER — Other Ambulatory Visit: Payer: Self-pay | Admitting: Family Medicine

## 2016-11-16 ENCOUNTER — Ambulatory Visit (INDEPENDENT_AMBULATORY_CARE_PROVIDER_SITE_OTHER): Payer: 59 | Admitting: Family Medicine

## 2016-11-16 ENCOUNTER — Encounter: Payer: Self-pay | Admitting: Family Medicine

## 2016-11-16 VITALS — BP 118/64 | HR 54 | Temp 97.5°F | Resp 16 | Wt 219.0 lb

## 2016-11-16 DIAGNOSIS — Z23 Encounter for immunization: Secondary | ICD-10-CM | POA: Diagnosis not present

## 2016-11-16 DIAGNOSIS — E669 Obesity, unspecified: Secondary | ICD-10-CM | POA: Diagnosis not present

## 2016-11-16 DIAGNOSIS — Z6835 Body mass index (BMI) 35.0-35.9, adult: Secondary | ICD-10-CM

## 2016-11-16 NOTE — Progress Notes (Signed)
Patient: Sharon Small Female    DOB: 1950/11/28   66 y.o.   MRN: [id number] Visit Date: 11/16/2016  Today's Provider: Wilhemena Durie, MD   Chief Complaint  Patient presents with  . Obesity   Subjective:    HPI Pt is here today to have paper work filled out for her employer. She needs an appeal form filled out for her BMI. Her BMI is 35.89 today. She reports that she is exercising 3-4 times a week, walking. She also has a Physiological scientist.     No Known Allergies   Current Outpatient Prescriptions:  .  amLODipine-benazepril (LOTREL) 5-20 MG capsule, TAKE 1 CAPSULE BY MOUTH  DAILY, Disp: 90 capsule, Rfl: 3 .  aspirin 81 MG tablet, Take 1 tablet by mouth daily., Disp: , Rfl:  .  Biotin 10 MG CAPS, Take by mouth., Disp: , Rfl:  .  bisoprolol-hydrochlorothiazide (ZIAC) 10-6.25 MG tablet, TAKE 1 TABLET BY MOUTH  DAILY, Disp: 90 tablet, Rfl: 3 .  FINACEA 15 % FOAM, APPLY TO SKIN BID, Disp: , Rfl: 11 .  Magnesium 65 MG TABS, Take 1 tablet by mouth daily., Disp: , Rfl:  .  Misc Natural Products (GLUCOSAMINE CHONDROITIN COMPLX) TABS, Take 1 tablet by mouth daily., Disp: , Rfl:  .  Multiple Vitamin (MULTIVITAMIN) tablet, Take 1 tablet by mouth daily., Disp: , Rfl:  .  omeprazole (PRILOSEC) 20 MG capsule, Take 20 mg by mouth daily., Disp: , Rfl:  .  simvastatin (ZOCOR) 20 MG tablet, TAKE 1 TABLET BY MOUTH AT  BEDTIME, Disp: 90 tablet, Rfl: 1  Review of Systems  Constitutional: Negative.   HENT: Negative.   Eyes: Negative.   Respiratory: Negative.   Cardiovascular: Negative.   Gastrointestinal: Negative.   Endocrine: Negative.   Genitourinary: Negative.   Musculoskeletal: Negative.   Skin: Negative.   Allergic/Immunologic: Negative.   Neurological: Negative.   Hematological: Negative.   Psychiatric/Behavioral: Negative.     Social History  Substance Use Topics  . Smoking status: Never Smoker  . Smokeless tobacco: Never Used  . Alcohol use No   Objective:   BP  118/64 (BP Location: Left Arm, Patient Position: Sitting, Cuff Size: Large)   Pulse (!) 54   Temp (!) 97.5 F (36.4 C) (Oral)   Resp 16   Wt 219 lb (99.3 kg)   SpO2 98%   BMI 35.89 kg/m  Vitals:   11/16/16 1518  BP: 118/64  Pulse: (!) 54  Resp: 16  Temp: (!) 97.5 F (36.4 C)  TempSrc: Oral  SpO2: 98%  Weight: 219 lb (99.3 kg)     Physical Exam  Constitutional: She is oriented to person, place, and time. She appears well-developed and well-nourished.  Eyes: Pupils are equal, round, and reactive to light. Conjunctivae and EOM are normal.  Neck: Normal range of motion. Neck supple.  Cardiovascular: Normal rate, regular rhythm, normal heart sounds and intact distal pulses.   Pulmonary/Chest: Effort normal and breath sounds normal.  Musculoskeletal: Normal range of motion.  Neurological: She is alert and oriented to person, place, and time. She has normal reflexes.  Skin: Skin is warm and dry.  Psychiatric: She has a normal mood and affect. Her behavior is normal. Judgment and thought content normal.        Assessment & Plan:     1. Class 2 obesity without serious comorbidity with body mass index (BMI) of 35.0 to 35.9 in adult, unspecified obesity type Discussed  diet and exercise. Filled out appeal form. Waist 39 inches. Goal 35 inches.   2. Need for influenza vaccination  - Flu vaccine HIGH DOSE PF  3. Need for pneumococcal vaccination  - Pneumococcal conjugate vaccine 13-valent IM 4.HTN 5.HLD    HPI, Exam, and A&P Transcribed under the direction and in the presence of Jaiyanna Safran L. Cranford Mon, MD  Electronically Signed: Katina Dung, CMA  I have done the exam and reviewed the above chart and it is accurate to the best of my knowledge. Development worker, community has been used in this note in any air is in the dictation or transcription are unintentional.  Wilhemena Durie, MD  [employer]

## 2016-11-27 ENCOUNTER — Ambulatory Visit (INDEPENDENT_AMBULATORY_CARE_PROVIDER_SITE_OTHER): Payer: 59 | Admitting: Family Medicine

## 2016-11-27 ENCOUNTER — Encounter: Payer: Self-pay | Admitting: Family Medicine

## 2016-11-27 VITALS — BP 120/82 | HR 59 | Temp 98.2°F | Resp 16

## 2016-11-27 DIAGNOSIS — J069 Acute upper respiratory infection, unspecified: Secondary | ICD-10-CM

## 2016-11-27 MED ORDER — ALBUTEROL SULFATE HFA 108 (90 BASE) MCG/ACT IN AERS: 2.0000 | INHALATION_SPRAY | Freq: Four times a day (QID) | RESPIRATORY_TRACT | 0 refills | Status: DC | PRN

## 2016-11-27 MED ORDER — HYDROCOD POLST-CPM POLST ER 10-8 MG/5ML PO SUER: 5.0000 mL | Freq: Two times a day (BID) | ORAL | 0 refills | Status: DC | PRN

## 2016-11-27 NOTE — Progress Notes (Signed)
Subjective:     Patient ID: Sharon Small, female   DOB: 1950/05/03, 66 y.o.   MRN: [id number]  HPI  Chief Complaint  Patient presents with  . Cough    Patient comes in office today with complaints of cough and shortness of breath x 1 week. Patient reports that she has had head congestion and wheezing in the PM. Patient has been taking otc Mucinex and Ibuprofen.   Both grandchild and husband have been sick as well. States she has had an itchy throat with clear sinus congestion and non-productive cough. Reports transient watery eyes, fever and chills at onset.   Review of Systems     Objective:   Physical Exam  Constitutional: She appears well-developed and well-nourished. No distress.  Ears: T.M's intact without inflammation Sinuses: non-tender Throat: no tonsillar enlargement or exudate Neck: no cervical adenopathy Lungs: Bilateral posterior expiratory wheezes.     Assessment:    1. Viral upper respiratory tract infection - chlorpheniramine-HYDROcodone (TUSSIONEX PENNKINETIC ER) 10-8 MG/5ML SUER; Take 5 mLs by mouth every 12 (twelve) hours as needed for cough.  Dispense: 60 mL; Refill: 0 - albuterol (VENTOLIN HFA) 108 (90 Base) MCG/ACT inhaler; Inhale 2 puffs into the lungs every 6 (six) hours as needed for wheezing or shortness of breath.  Dispense: 18 g; Refill: 0    Plan:   MDI demonstration provided. Will call if not improving over the next week.

## 2016-11-27 NOTE — Patient Instructions (Signed)
Continue Mucinex. Let me know if cough not improving over the course of next week.

## 2016-12-15 ENCOUNTER — Other Ambulatory Visit: Payer: Self-pay | Admitting: Family Medicine

## 2016-12-15 DIAGNOSIS — J069 Acute upper respiratory infection, unspecified: Secondary | ICD-10-CM

## 2016-12-24 ENCOUNTER — Other Ambulatory Visit: Payer: Self-pay | Admitting: Family Medicine

## 2016-12-24 DIAGNOSIS — Z1231 Encounter for screening mammogram for malignant neoplasm of breast: Secondary | ICD-10-CM

## 2017-01-13 ENCOUNTER — Other Ambulatory Visit: Payer: Self-pay | Admitting: Family Medicine

## 2017-01-18 ENCOUNTER — Ambulatory Visit
Admission: RE | Admit: 2017-01-18 | Discharge: 2017-01-18 | Disposition: A | Discharge status: Home / Self Care | Payer: 59 | Source: Ambulatory Visit | Attending: Family Medicine | Admitting: Family Medicine

## 2017-01-18 DIAGNOSIS — Z1231 Encounter for screening mammogram for malignant neoplasm of breast: Secondary | ICD-10-CM | POA: Diagnosis not present

## 2017-02-13 ENCOUNTER — Other Ambulatory Visit: Payer: Self-pay | Admitting: Family Medicine

## 2017-02-22 ENCOUNTER — Encounter: Payer: Self-pay | Admitting: Family Medicine

## 2017-02-24 ENCOUNTER — Encounter: Payer: Self-pay | Admitting: Family Medicine

## 2017-02-24 ENCOUNTER — Other Ambulatory Visit: Payer: Self-pay

## 2017-02-24 ENCOUNTER — Ambulatory Visit (INDEPENDENT_AMBULATORY_CARE_PROVIDER_SITE_OTHER): Payer: 59 | Admitting: Family Medicine

## 2017-02-24 VITALS — BP 138/72 | HR 80 | Temp 98.1°F | Resp 16 | Ht 64.5 in | Wt 220.0 lb

## 2017-02-24 DIAGNOSIS — I10 Essential (primary) hypertension: Secondary | ICD-10-CM

## 2017-02-24 DIAGNOSIS — E7800 Pure hypercholesterolemia, unspecified: Secondary | ICD-10-CM | POA: Diagnosis not present

## 2017-02-24 DIAGNOSIS — R0683 Snoring: Secondary | ICD-10-CM | POA: Diagnosis not present

## 2017-02-24 DIAGNOSIS — K219 Gastro-esophageal reflux disease without esophagitis: Secondary | ICD-10-CM

## 2017-02-24 DIAGNOSIS — Z6837 Body mass index (BMI) 37.0-37.9, adult: Secondary | ICD-10-CM

## 2017-02-24 DIAGNOSIS — Z0000 Encounter for general adult medical examination without abnormal findings: Secondary | ICD-10-CM

## 2017-02-24 NOTE — Progress Notes (Signed)
Patient: Sharon Small, Female    DOB: May 07, 1950, 67 y.o.   MRN: [id number] Visit Date: 02/24/2017  Today's Provider: Wilhemena Durie, MD   Chief Complaint  Patient presents with  . Annual Exam   Subjective:  Sharon Small is a 67 y.o. female who presents today for health maintenance and complete physical. She feels well. She reports exercising not at this time . She reports she is sleeping well.  Last colonoscopy was 07/04/15 Dr Allen Norris, non bleeding hemorrhoids, repeat in 10 years. Mammogram 01/18/17 negative BMD 04/14/05 normal. Pap smear 06/17/09 negative-never had abnormal pap smears in the past, had hysterectomy and patient thinks it was total-in 2005.  Review of Systems  Constitutional: Negative.   HENT: Positive for hearing loss.   Eyes: Negative.   Respiratory: Positive for shortness of breath.   Cardiovascular: Negative.   Gastrointestinal: Negative.   Endocrine: Negative.   Genitourinary: Negative.   Musculoskeletal: Positive for arthralgias and myalgias.  Skin: Negative.   Allergic/Immunologic: Negative.   Neurological: Negative.   Hematological: Negative.   Psychiatric/Behavioral: Negative.     Social History   Socioeconomic History  . Marital status: Married    Spouse name: Not on file  . Number of children: Not on file  . Years of education: Not on file  . Highest education level: Not on file  Social Needs  . Financial resource strain: Not on file  . Food insecurity - worry: Not on file  . Food insecurity - inability: Not on file  . Transportation needs - medical: Not on file  . Transportation needs - non-medical: Not on file  Occupational History  . Not on file  Tobacco Use  . Smoking status: Never Smoker  . Smokeless tobacco: Never Used  Substance and Sexual Activity  . Alcohol use: No  . Drug use: No  . Sexual activity: Yes  Other Topics Concern  . Not on file  Social History Narrative  . Not on file    Patient Active Problem List    Diagnosis Date Noted  . Special screening for malignant neoplasms, colon   . Borderline diabetes 07/08/2014  . Muscle ache 07/08/2014  . Snores 07/08/2014  . B12 deficiency 07/08/2014  . Malaise and fatigue 12/26/2008  . Allergic rhinitis 05/29/2008  . Acid reflux 05/29/2008  . Essential (primary) hypertension 05/29/2008  . HLD (hyperlipidemia) 05/29/2008  . Adiposity 05/29/2008  . Arthritis, degenerative 05/29/2008    Past Surgical History:  Procedure Laterality Date  . CATARACT EXTRACTION Bilateral 2003 & 2005  . COLONOSCOPY    . COLONOSCOPY WITH PROPOFOL N/A 07/04/2015   Procedure: COLONOSCOPY WITH PROPOFOL;  Surgeon: Lucilla Lame, MD;  Location: [facility];  Service: Endoscopy;  Laterality: N/A;  prefers early  . VAGINAL HYSTERECTOMY  2005   Bilateral oophorectomy    Her family history includes Breast cancer (age of onset: 75) in her maternal aunt; COPD in her mother; Glaucoma in her sister; Heart disease in her father; Hypertension in her sister; Osteoporosis in her mother; Peripheral Artery Disease in her mother.     Outpatient Encounter Medications as of 02/24/2017  Medication Sig Note  . amLODipine-benazepril (LOTREL) 5-20 MG capsule TAKE 1 CAPSULE BY MOUTH  DAILY   . aspirin 81 MG tablet Take 1 tablet by mouth daily. 07/08/2014: Received from: [facility]: ASPIRIN EC, 81MG  (Oral Tablet Delayed Release)  1 Every Day for 0 days  Quantity: 0.00;  Refills: 0   Ordered :13-Feb-2010  Althea Charon ;  Started 29-May-2008 Active  . Biotin 10 MG CAPS Take by mouth.   . bisoprolol-hydrochlorothiazide (ZIAC) 10-6.25 MG tablet TAKE 1 TABLET BY MOUTH  DAILY   . Cholecalciferol (VITAMIN D) 2000 units CAPS Take by mouth daily.   Marland Kitchen FINACEA 15 % FOAM APPLY TO SKIN BID 09/16/2014: Received from: External Pharmacy  . Magnesium 65 MG TABS Take 1 tablet by mouth daily. 07/08/2014: Received from: [facility]: MAGNESIUM, 65MG  (Oral  Tablet)  1 Every Day for 0 days  Quantity: 0.00;  Refills: 0   Ordered :13-Feb-2010  Ashley Royalty ;  Started 08-Apr-2009 Active  . Misc Natural Products (GLUCOSAMINE CHONDROITIN COMPLX) TABS Take 1 tablet by mouth daily. 07/08/2014: Received from: [facility]:   . Multiple Vitamin (MULTIVITAMIN) tablet Take 1 tablet by mouth daily.   Marland Kitchen omeprazole (PRILOSEC) 20 MG capsule Take 20 mg by mouth daily.   . simvastatin (ZOCOR) 20 MG tablet TAKE 1 TABLET BY MOUTH AT  BEDTIME   . VENTOLIN HFA 108 (90 Base) MCG/ACT inhaler INHALE 2 PUFFS INTO THE LUNGS EVERY 6 HOURS AS NEEDED FOR WHEEZING OR SHORTNESS OF BREATH (Patient not taking: Reported on 02/24/2017)   . [DISCONTINUED] chlorpheniramine-HYDROcodone (TUSSIONEX PENNKINETIC ER) 10-8 MG/5ML SUER Take 5 mLs by mouth every 12 (twelve) hours as needed for cough.    No facility-administered encounter medications on file as of 02/24/2017.     Patient Care Team: Jerrol Banana., MD as PCP - General (Family Medicine) Minna Merritts, MD as Consulting Physician (Cardiology)      Objective:   Vitals:  Vitals:   02/24/17 0916  BP: 138/72  Pulse: 80  Resp: 16  Temp: 98.1 F (36.7 C)  Weight: 220 lb (99.8 kg)  Height: 5' 4.5" (1.638 m)    Physical Exam  Constitutional: She is oriented to person, place, and time. She appears well-developed and well-nourished.  HENT:  Head: Normocephalic and atraumatic.  Right Ear: External ear normal.  Left Ear: External ear normal.  Nose: Nose normal.  Mouth/Throat: Oropharynx is clear and moist.  Eyes: Conjunctivae are normal. No scleral icterus.  Neck: No thyromegaly present.  Cardiovascular: Normal rate, regular rhythm, normal heart sounds and intact distal pulses.  Pulmonary/Chest: Effort normal and breath sounds normal.  Abdominal: Soft.  Lymphadenopathy:    She has no cervical adenopathy.  Neurological: She is alert and oriented to person, place, and time.  Skin:  Skin is warm and dry.  Psychiatric: She has a normal mood and affect. Her behavior is normal. Judgment and thought content normal.     Depression Screen PHQ 2/9 Scores 02/24/2017 11/16/2016 10/24/2014 09/16/2014  PHQ - 2 Score 0 0 0 0  PHQ- 9 Score 2 - - -   Assessment & Plan:   1. Annual physical exam - patient declined rectal exam today. Epworth is 10-order home sleep study. CBC with Differential/Platelet - Comprehensive metabolic panel - Lipid Panel With LDL/HDL Ratio - TSH  2. BMI 37.0-37.9, adult 3. Essential hypertension  4. Gastroesophageal reflux disease, esophagitis presence not specified  5. Pure hypercholesterolemia 6.s/p Hysterectomy  HPI, Exam and A&P transcribed by Tiffany Kocher, RMA under direction and in the presence of Miguel Aschoff, MD. I have done the exam and reviewed the chart and it is accurate to the best of my knowledge. Development worker, community has been used and  any errors in dictation or transcription are unintentional. Miguel Aschoff M.D. [facility]  [place]

## 2017-03-15 ENCOUNTER — Encounter: Payer: Self-pay | Admitting: Family Medicine

## 2017-03-28 ENCOUNTER — Telehealth: Payer: Self-pay | Admitting: Family Medicine

## 2017-03-28 NOTE — Telephone Encounter (Signed)
Order sent to [facility].

## 2017-03-28 NOTE — Telephone Encounter (Signed)
Patient states that she had a in home sleep study a couple of weeks ago and still has not heard anything back about this.  Please advise.

## 2017-04-02 LAB — CBC WITH DIFFERENTIAL/PLATELET
BASOS ABS: 0 10*3/uL (ref 0.0–0.2)
BASOS: 0 %
EOS (ABSOLUTE): 0.1 10*3/uL (ref 0.0–0.4)
Eos: 2 %
Hematocrit: 38.9 % (ref 34.0–46.6)
Hemoglobin: 12.9 g/dL (ref 11.1–15.9)
IMMATURE GRANS (ABS): 0 10*3/uL (ref 0.0–0.1)
IMMATURE GRANULOCYTES: 0 %
LYMPHS: 32 %
Lymphocytes Absolute: 2 10*3/uL (ref 0.7–3.1)
MCH: 29.9 pg (ref 26.6–33.0)
MCHC: 33.2 g/dL (ref 31.5–35.7)
MCV: 90 fL (ref 79–97)
MONOS ABS: 0.5 10*3/uL (ref 0.1–0.9)
Monocytes: 8 %
NEUTROS PCT: 58 %
Neutrophils Absolute: 3.5 10*3/uL (ref 1.4–7.0)
PLATELETS: 318 10*3/uL (ref 150–379)
RBC: 4.32 x10E6/uL (ref 3.77–5.28)
RDW: 13.3 % (ref 12.3–15.4)
WBC: 6.2 10*3/uL (ref 3.4–10.8)

## 2017-04-02 LAB — COMPREHENSIVE METABOLIC PANEL
A/G RATIO: 1.7 (ref 1.2–2.2)
ALT: 24 IU/L (ref 0–32)
AST: 21 IU/L (ref 0–40)
Albumin: 4.5 g/dL (ref 3.6–4.8)
Alkaline Phosphatase: 61 IU/L (ref 39–117)
BUN/Creatinine Ratio: 18 (ref 12–28)
BUN: 13 mg/dL (ref 8–27)
Bilirubin Total: 0.3 mg/dL (ref 0.0–1.2)
CALCIUM: 9.8 mg/dL (ref 8.7–10.3)
CHLORIDE: 104 mmol/L (ref 96–106)
CO2: 26 mmol/L (ref 20–29)
Creatinine, Ser: 0.73 mg/dL (ref 0.57–1.00)
GFR, EST AFRICAN AMERICAN: 99 mL/min/{1.73_m2} (ref >59)
GFR, EST NON AFRICAN AMERICAN: 85 mL/min/{1.73_m2} (ref >59)
Globulin, Total: 2.7 g/dL (ref 1.5–4.5)
Glucose: 93 mg/dL (ref 65–99)
POTASSIUM: 4.7 mmol/L (ref 3.5–5.2)
Sodium: 144 mmol/L (ref 134–144)
TOTAL PROTEIN: 7.2 g/dL (ref 6.0–8.5)

## 2017-04-02 LAB — LIPID PANEL WITH LDL/HDL RATIO
Cholesterol, Total: 161 mg/dL (ref 100–199)
HDL: 49 mg/dL (ref >39)
LDL Calculated: 79 mg/dL (ref 0–99)
LDL/HDL RATIO: 1.6 ratio (ref 0.0–3.2)
TRIGLYCERIDES: 166 mg/dL — AB (ref 0–149)
VLDL CHOLESTEROL CAL: 33 mg/dL (ref 5–40)

## 2017-04-02 LAB — TSH: TSH: 3.03 u[IU]/mL (ref 0.450–4.500)

## 2017-04-04 ENCOUNTER — Telehealth: Payer: Self-pay

## 2017-04-04 NOTE — Telephone Encounter (Signed)
Patient advised.KW 

## 2017-04-04 NOTE — Telephone Encounter (Signed)
-----   Message from Jerrol Banana., MD sent at 04/04/2017  8:19 AM EST ----- OK.

## 2017-04-04 NOTE — Telephone Encounter (Signed)
[id number] 

## 2017-04-12 ENCOUNTER — Encounter: Payer: Self-pay | Admitting: Family Medicine

## 2017-04-20 ENCOUNTER — Ambulatory Visit (INDEPENDENT_AMBULATORY_CARE_PROVIDER_SITE_OTHER): Payer: 59 | Admitting: Family Medicine

## 2017-04-20 ENCOUNTER — Encounter: Payer: Self-pay | Admitting: Family Medicine

## 2017-04-20 VITALS — BP 126/72 | HR 64 | Temp 97.7°F | Resp 16

## 2017-04-20 DIAGNOSIS — G4733 Obstructive sleep apnea (adult) (pediatric): Secondary | ICD-10-CM | POA: Diagnosis not present

## 2017-04-20 NOTE — Progress Notes (Signed)
Patient: Sharon Small Female    DOB: 1950/10/01   67 y.o.   MRN: [id number] Visit Date: 04/20/2017  Today's Provider: Wilhemena Durie, MD   Chief Complaint  Patient presents with  . Sleep Apnea   Subjective:    HPI Pt is here today to discuss sleep apnea. She has a lot of questions regarding the disease and the long term effects of going with out treatment. Also wants to try out another company to get her machine because she was not happy with [facility]. She has questions about masking and about her sleep study results.       No Known Allergies   Current Outpatient Medications:  .  amLODipine-benazepril (LOTREL) 5-20 MG capsule, TAKE 1 CAPSULE BY MOUTH  DAILY, Disp: 90 capsule, Rfl: 3 .  aspirin 81 MG tablet, Take 1 tablet by mouth daily., Disp: , Rfl:  .  Biotin 10 MG CAPS, Take by mouth., Disp: , Rfl:  .  bisoprolol-hydrochlorothiazide (ZIAC) 10-6.25 MG tablet, TAKE 1 TABLET BY MOUTH  DAILY, Disp: 90 tablet, Rfl: 3 .  Cholecalciferol (VITAMIN D) 2000 units CAPS, Take by mouth daily., Disp: , Rfl:  .  FINACEA 15 % FOAM, APPLY TO SKIN BID, Disp: , Rfl: 11 .  Magnesium 65 MG TABS, Take 1 tablet by mouth daily., Disp: , Rfl:  .  Misc Natural Products (GLUCOSAMINE CHONDROITIN COMPLX) TABS, Take 1 tablet by mouth daily., Disp: , Rfl:  .  Multiple Vitamin (MULTIVITAMIN) tablet, Take 1 tablet by mouth daily., Disp: , Rfl:  .  omeprazole (PRILOSEC) 20 MG capsule, Take 20 mg by mouth daily., Disp: , Rfl:  .  simvastatin (ZOCOR) 20 MG tablet, TAKE 1 TABLET BY MOUTH AT  BEDTIME, Disp: 90 tablet, Rfl: 1 .  VENTOLIN HFA 108 (90 Base) MCG/ACT inhaler, INHALE 2 PUFFS INTO THE LUNGS EVERY 6 HOURS AS NEEDED FOR WHEEZING OR SHORTNESS OF BREATH (Patient not taking: Reported on 02/24/2017), Disp: 18 g, Rfl: 0  Review of Systems  Constitutional: Negative.   HENT: Negative.   Eyes: Negative.   Respiratory: Positive for apnea.   Cardiovascular: Negative.   Gastrointestinal: Negative.     Endocrine: Negative.   Genitourinary: Negative.   Musculoskeletal: Negative.   Skin: Negative.   Allergic/Immunologic: Negative.   Neurological: Negative.   Hematological: Negative.   Psychiatric/Behavioral: Negative.     Social History   Tobacco Use  . Smoking status: Never Smoker  . Smokeless tobacco: Never Used  Substance Use Topics  . Alcohol use: No   Objective:   BP 126/72 (BP Location: Left Arm, Patient Position: Sitting, Cuff Size: Large)   Pulse 64   Temp 97.7 F (36.5 C) (Oral)   Resp 16  Vitals:   04/20/17 1549  BP: 126/72  Pulse: 64  Resp: 16  Temp: 97.7 F (36.5 C)  TempSrc: Oral     Physical Exam  Constitutional: She is oriented to person, place, and time. She appears well-developed and well-nourished.  Eyes: Conjunctivae and EOM are normal. Pupils are equal, round, and reactive to light.  Neck: Normal range of motion. Neck supple.  Cardiovascular: Normal rate, regular rhythm, normal heart sounds and intact distal pulses.  Pulmonary/Chest: Effort normal and breath sounds normal.  Musculoskeletal: Normal range of motion.  Neurological: She is alert and oriented to person, place, and time. She has normal reflexes.  Skin: Skin is warm and dry.  Psychiatric: She has a normal mood and affect. Her  behavior is normal. Judgment and thought content normal.        Assessment & Plan:     1. Obstructive sleep apnea syndrome Discussed risk and benefits of sleep apnea and CPAP machine.      HPI, Exam, and A&P Transcribed under the direction and in the presence of Richard L. Cranford Mon, MD  Electronically Signed: Katina Dung, [place], MD  [facility]

## 2017-04-22 ENCOUNTER — Telehealth: Payer: Self-pay | Admitting: Family Medicine

## 2017-04-22 NOTE — Telephone Encounter (Signed)
Order for auto titrating CPAP machine faxed to [facility]

## 2017-04-25 ENCOUNTER — Telehealth: Payer: Self-pay | Admitting: Family Medicine

## 2017-04-25 NOTE — Telephone Encounter (Signed)
Note printed.  Sent to be faxed

## 2017-04-25 NOTE — Telephone Encounter (Signed)
Barbaraann Rondo w/ [facility] wants to know if there is an office note prior to 03/15/17 sleep study, that suggests she has sleep apnea.   They have to have this in order for ins. To cover machine.

## 2017-04-27 NOTE — Telephone Encounter (Signed)
Sharon Small with [facility] is requesting Sharon Small return his call. Sharon Small stated that he did receive the office note that was faxed on 04/25/17 but the CPAP order they received didn't have the pressure setting on the order. Sharon Small is requesting another order that includes the pressure setting on the order so they can process this for pt. Please advise. Thanks TNP

## 2017-04-27 NOTE — Telephone Encounter (Signed)
Done

## 2017-05-12 ENCOUNTER — Encounter: Payer: Self-pay | Admitting: Family Medicine

## 2017-05-12 ENCOUNTER — Ambulatory Visit (INDEPENDENT_AMBULATORY_CARE_PROVIDER_SITE_OTHER): Payer: 59 | Admitting: Family Medicine

## 2017-05-12 VITALS — BP 124/62 | HR 58 | Temp 98.3°F | Resp 18 | Wt 216.0 lb

## 2017-05-12 DIAGNOSIS — J329 Chronic sinusitis, unspecified: Secondary | ICD-10-CM | POA: Diagnosis not present

## 2017-05-12 MED ORDER — AMOXICILLIN-POT CLAVULANATE 875-125 MG PO TABS: 1.0000 | ORAL_TABLET | Freq: Two times a day (BID) | ORAL | 0 refills | Status: DC

## 2017-05-12 NOTE — Progress Notes (Signed)
Patient: Sharon Small Female    DOB: [birth year]   67 y.o.   MRN: [id number] Visit Date: 05/12/2017  Today's Provider: Wilhemena Durie, MD   Chief Complaint  Patient presents with  . URI   Subjective:    HPI Pt is here for URI symptoms. She started having symptoms 5 days ago. She has nasal congestion, runny rose, cough, yellow/green sputum production, and headache. Pt denies fever.      No Known Allergies   Current Outpatient Medications:  .  amLODipine-benazepril (LOTREL) 5-20 MG capsule, TAKE 1 CAPSULE BY MOUTH  DAILY, Disp: 90 capsule, Rfl: 3 .  aspirin 81 MG tablet, Take 1 tablet by mouth daily., Disp: , Rfl:  .  Biotin 10 MG CAPS, Take by mouth., Disp: , Rfl:  .  bisoprolol-hydrochlorothiazide (ZIAC) 10-6.25 MG tablet, TAKE 1 TABLET BY MOUTH  DAILY, Disp: 90 tablet, Rfl: 3 .  Cholecalciferol (VITAMIN D) 2000 units CAPS, Take by mouth daily., Disp: , Rfl:  .  FINACEA 15 % FOAM, APPLY TO SKIN BID, Disp: , Rfl: 11 .  Magnesium 65 MG TABS, Take 1 tablet by mouth daily., Disp: , Rfl:  .  Misc Natural Products (GLUCOSAMINE CHONDROITIN COMPLX) TABS, Take 1 tablet by mouth daily., Disp: , Rfl:  .  Multiple Vitamin (MULTIVITAMIN) tablet, Take 1 tablet by mouth daily., Disp: , Rfl:  .  omeprazole (PRILOSEC) 20 MG capsule, Take 20 mg by mouth daily., Disp: , Rfl:  .  simvastatin (ZOCOR) 20 MG tablet, TAKE 1 TABLET BY MOUTH AT  BEDTIME, Disp: 90 tablet, Rfl: 1 .  VENTOLIN HFA 108 (90 Base) MCG/ACT inhaler, INHALE 2 PUFFS INTO THE LUNGS EVERY 6 HOURS AS NEEDED FOR WHEEZING OR SHORTNESS OF BREATH (Patient not taking: Reported on 02/24/2017), Disp: 18 g, Rfl: 0  Review of Systems  Constitutional: Positive for chills and fatigue.  HENT: Positive for congestion, postnasal drip, rhinorrhea and sneezing.   Eyes: Negative.   Respiratory: Positive for cough and chest tightness.   Cardiovascular: Negative.   Gastrointestinal: Negative.   Endocrine: Negative.   Musculoskeletal:  Negative.   Skin: Negative.   Allergic/Immunologic: Negative.   Neurological: Positive for headaches.  Hematological: Negative.   Psychiatric/Behavioral: Negative.     Social History   Tobacco Use  . Smoking status: Never Smoker  . Smokeless tobacco: Never Used  Substance Use Topics  . Alcohol use: No   Objective:   BP 124/62 (BP Location: Left Arm, Patient Position: Sitting, Cuff Size: Large)   Pulse (!) 58   Temp 98.3 F (36.8 C) (Oral)   Resp 18   Wt 216 lb (98 kg)   SpO2 94%   BMI 36.50 kg/m  Vitals:   05/12/17 1426  BP: 124/62  Pulse: (!) 58  Resp: 18  Temp: 98.3 F (36.8 C)  TempSrc: Oral  SpO2: 94%  Weight: 216 lb (98 kg)     Physical Exam  Constitutional: She is oriented to person, place, and time. She appears well-developed and well-nourished.  HENT:  Head: Normocephalic and atraumatic.  Right Ear: External ear normal.  Left Ear: External ear normal.  Nose: Nose normal.  Mouth/Throat: Oropharynx is clear and moist.  Eyes: Conjunctivae are normal.  Neck: No thyromegaly present.  Cardiovascular: Normal rate, regular rhythm and normal heart sounds.  Pulmonary/Chest: Effort normal and breath sounds normal.  Abdominal: Soft.  Neurological: She is alert and oriented to person, place, and time.  Skin: Skin is  warm and dry.  Psychiatric: She has a normal mood and affect. Her behavior is normal. Judgment and thought content normal.        Assessment & Plan:     1. Sinusitis, unspecified chronicity, unspecified location  - amoxicillin-clavulanate (AUGMENTIN) 875-125 MG tablet; Take 1 tablet by mouth 2 (two) times daily.  Dispense: 20 tablet; Refill: 0      I have done the exam and reviewed the chart and it is accurate to the best of my knowledge. Development worker, community has been used and  any errors in dictation or transcription are unintentional. Miguel Aschoff M.D. [place], MD    [facility]

## 2017-05-18 ENCOUNTER — Other Ambulatory Visit: Payer: Self-pay | Admitting: Family Medicine

## 2017-05-18 ENCOUNTER — Telehealth: Payer: Self-pay | Admitting: Family Medicine

## 2017-05-18 MED ORDER — HYDROCOD POLST-CPM POLST ER 10-8 MG/5ML PO SUER: 5.0000 mL | Freq: Two times a day (BID) | ORAL | 0 refills | Status: DC | PRN

## 2017-05-18 NOTE — Telephone Encounter (Signed)
Sent!

## 2017-05-18 NOTE — Telephone Encounter (Signed)
Pt called saying she was in last week for a sinus infection.  The sinus is better but she has a cough now.  She would like tussanex to be called in  [place] uses [facility] [place]   Pt's call back Is [phone] after 3pm [phone]

## 2017-05-26 ENCOUNTER — Ambulatory Visit: Payer: Self-pay | Admitting: Family Medicine

## 2017-07-06 ENCOUNTER — Ambulatory Visit: Payer: 59 | Admitting: Family Medicine

## 2017-07-06 ENCOUNTER — Ambulatory Visit (INDEPENDENT_AMBULATORY_CARE_PROVIDER_SITE_OTHER): Payer: 59 | Admitting: Family Medicine

## 2017-07-06 VITALS — HR 68 | Temp 97.8°F | Resp 16

## 2017-07-06 DIAGNOSIS — G4733 Obstructive sleep apnea (adult) (pediatric): Secondary | ICD-10-CM

## 2017-07-06 NOTE — Progress Notes (Signed)
Sharon Small  MRN: [id number] DOB: [birth year]  Subjective:  HPI   The patient is a 67 year old female who presents for follow up of her starting use of CPAP.  She has been using it for about a month now and states she is compliant and tolerating it fairly well.  She has been able to wear it every night for at least 6 hours up to 7 hours.  She has been checking her scores and reports they have been good.  Patient Active Problem List   Diagnosis Date Noted  . Special screening for malignant neoplasms, colon   . Borderline diabetes 07/08/2014  . Muscle ache 07/08/2014  . Snores 07/08/2014  . B12 deficiency 07/08/2014  . Malaise and fatigue 12/26/2008  . Allergic rhinitis 05/29/2008  . Acid reflux 05/29/2008  . Essential (primary) hypertension 05/29/2008  . HLD (hyperlipidemia) 05/29/2008  . Adiposity 05/29/2008  . Arthritis, degenerative 05/29/2008    Past Medical History:  Diagnosis Date  . GERD (gastroesophageal reflux disease)   . Hypertension   . Snores     Social History   Socioeconomic History  . Marital status: Married    Spouse name: Not on file  . Number of children: Not on file  . Years of education: Not on file  . Highest education level: Not on file  Occupational History  . Not on file  Social Needs  . Financial resource strain: Not on file  . Food insecurity:    Worry: Not on file    Inability: Not on file  . Transportation needs:    Medical: Not on file    Non-medical: Not on file  Tobacco Use  . Smoking status: Never Smoker  . Smokeless tobacco: Never Used  Substance and Sexual Activity  . Alcohol use: No  . Drug use: No  . Sexual activity: Yes  Lifestyle  . Physical activity:    Days per week: Not on file    Minutes per session: Not on file  . Stress: Not on file  Relationships  . Social connections:    Talks on phone: Not on file    Gets together: Not on file    Attends religious service: Not on file    Active member of club or  organization: Not on file    Attends meetings of clubs or organizations: Not on file    Relationship status: Not on file  . Intimate partner violence:    Fear of current or ex partner: Not on file    Emotionally abused: Not on file    Physically abused: Not on file    Forced sexual activity: Not on file  Other Topics Concern  . Not on file  Social History Narrative  . Not on file    Outpatient Encounter Medications as of 07/06/2017  Medication Sig Note  . amLODipine-benazepril (LOTREL) 5-20 MG capsule TAKE 1 CAPSULE BY MOUTH  DAILY   . aspirin 81 MG tablet Take 1 tablet by mouth daily. 07/08/2014: Received from: [place]: ASPIRIN EC, 81MG  (Oral Tablet Delayed Release)  1 Every Day for 0 days  Quantity: 0.00;  Refills: 0   Ordered :13-Feb-2010  Althea Charon ;  Started 29-May-2008 Active  . Biotin 10 MG CAPS Take by mouth.   . bisoprolol-hydrochlorothiazide (ZIAC) 10-6.25 MG tablet TAKE 1 TABLET BY MOUTH  DAILY   . Cholecalciferol (VITAMIN D) 2000 units CAPS Take by mouth daily.   Marland Kitchen FINACEA 15 % FOAM  APPLY TO SKIN BID 09/16/2014: Received from: External Pharmacy  . Magnesium 65 MG TABS Take 1 tablet by mouth daily. 07/08/2014: Received from: [place]: MAGNESIUM, 65MG  (Oral Tablet)  1 Every Day for 0 days  Quantity: 0.00;  Refills: 0   Ordered :13-Feb-2010  Ashley Royalty ;  Started 08-Apr-2009 Active  . Misc Natural Products (GLUCOSAMINE CHONDROITIN COMPLX) TABS Take 1 tablet by mouth daily. 07/08/2014: Received from: [place]:   . Multiple Vitamin (MULTIVITAMIN) tablet Take 1 tablet by mouth daily.   Marland Kitchen omeprazole (PRILOSEC) 20 MG capsule Take 20 mg by mouth daily.   . simvastatin (ZOCOR) 20 MG tablet TAKE 1 TABLET BY MOUTH AT  BEDTIME   . [DISCONTINUED] amoxicillin-clavulanate (AUGMENTIN) 875-125 MG tablet Take 1 tablet by mouth 2 (two) times daily.   . [DISCONTINUED]  chlorpheniramine-HYDROcodone (TUSSIONEX PENNKINETIC ER) 10-8 MG/5ML SUER Take 5 mLs by mouth every 12 (twelve) hours as needed for cough.   . [DISCONTINUED] VENTOLIN HFA 108 (90 Base) MCG/ACT inhaler INHALE 2 PUFFS INTO THE LUNGS EVERY 6 HOURS AS NEEDED FOR WHEEZING OR SHORTNESS OF BREATH (Patient not taking: Reported on 02/24/2017)    No facility-administered encounter medications on file as of 07/06/2017.     No Known Allergies  Review of Systems  Constitutional: Negative for fever and malaise/fatigue.  HENT: Negative.   Eyes: Negative.   Respiratory: Negative for cough, shortness of breath and wheezing.   Cardiovascular: Negative for chest pain and palpitations.  Gastrointestinal: Negative.   Skin: Negative.   Endo/Heme/Allergies: Negative.   Psychiatric/Behavioral: Negative.     Objective:  Pulse 68   Temp 97.8 F (36.6 C) (Oral)   Resp 16   Physical Exam  Constitutional: She is oriented to person, place, and time and well-developed, well-nourished, and in no distress.  HENT:  Head: Normocephalic and atraumatic.  Eyes: Conjunctivae are normal. No scleral icterus.  Neck: No thyromegaly present.  Cardiovascular: Normal rate, regular rhythm and normal heart sounds.  Pulmonary/Chest: Effort normal and breath sounds normal.  Abdominal: Soft.  Neurological: She is alert and oriented to person, place, and time. Gait normal. GCS score is 15.  Skin: Skin is warm and dry.  Psychiatric: Mood, memory, affect and judgment normal.    Assessment and Plan :  OSA Pt using CPAP nightly. Tolerating it and feeling better.  I have done the exam and reviewed the chart and it is accurate to the best of my knowledge. Development worker, community has been used and  any errors in dictation or transcription are unintentional. Miguel Aschoff M.D. [facility]

## 2017-08-25 ENCOUNTER — Ambulatory Visit: Payer: Self-pay | Admitting: Family Medicine

## 2017-08-29 ENCOUNTER — Other Ambulatory Visit: Payer: Self-pay | Admitting: Family Medicine

## 2017-08-29 NOTE — Telephone Encounter (Signed)
Mail order pharmacy requesting refills. Thanks!  

## 2017-11-03 ENCOUNTER — Ambulatory Visit: Payer: 59 | Admitting: Family Medicine

## 2017-11-03 VITALS — BP 150/88 | HR 74 | Temp 97.7°F | Resp 16 | Wt 220.0 lb

## 2017-11-03 DIAGNOSIS — E669 Obesity, unspecified: Secondary | ICD-10-CM | POA: Diagnosis not present

## 2017-11-03 DIAGNOSIS — Z23 Encounter for immunization: Secondary | ICD-10-CM

## 2017-11-03 DIAGNOSIS — Z6837 Body mass index (BMI) 37.0-37.9, adult: Secondary | ICD-10-CM | POA: Diagnosis not present

## 2017-11-03 DIAGNOSIS — I10 Essential (primary) hypertension: Secondary | ICD-10-CM | POA: Diagnosis not present

## 2017-11-03 MED ORDER — AMLODIPINE BESY-BENAZEPRIL HCL 10-40 MG PO CAPS: 1.0000 | ORAL_CAPSULE | Freq: Every day | ORAL | 3 refills | Status: DC

## 2017-11-03 NOTE — Progress Notes (Addendum)
Sharon Small  MRN: [id number] DOB: [birth year]  Subjective:  HPI   The patient is a 67 year old female who presents for [employer] for her insurance.  Waist 43 in.  Patient Active Problem List   Diagnosis Date Noted  . Special screening for malignant neoplasms, colon   . Borderline diabetes 07/08/2014  . Muscle ache 07/08/2014  . Snores 07/08/2014  . B12 deficiency 07/08/2014  . Malaise and fatigue 12/26/2008  . Allergic rhinitis 05/29/2008  . Acid reflux 05/29/2008  . Essential (primary) hypertension 05/29/2008  . HLD (hyperlipidemia) 05/29/2008  . Adiposity 05/29/2008  . Arthritis, degenerative 05/29/2008    Past Medical History:  Diagnosis Date  . GERD (gastroesophageal reflux disease)   . Hypertension   . Snores     Social History   Socioeconomic History  . Marital status: Married    Spouse name: Not on file  . Number of children: Not on file  . Years of education: Not on file  . Highest education level: Not on file  Occupational History  . Not on file  Social Needs  . Financial resource strain: Not on file  . Food insecurity:    Worry: Not on file    Inability: Not on file  . Transportation needs:    Medical: Not on file    Non-medical: Not on file  Tobacco Use  . Smoking status: Never Smoker  . Smokeless tobacco: Never Used  Substance and Sexual Activity  . Alcohol use: No  . Drug use: No  . Sexual activity: Yes  Lifestyle  . Physical activity:    Days per week: Not on file    Minutes per session: Not on file  . Stress: Not on file  Relationships  . Social connections:    Talks on phone: Not on file    Gets together: Not on file    Attends religious service: Not on file    Active member of club or organization: Not on file    Attends meetings of clubs or organizations: Not on file    Relationship status: Not on file  . Intimate partner violence:    Fear of current or ex partner: Not on file    Emotionally abused: Not on file      Physically abused: Not on file    Forced sexual activity: Not on file  Other Topics Concern  . Not on file  Social History Narrative  . Not on file    Outpatient Encounter Medications as of 11/03/2017  Medication Sig Note  . amLODipine-benazepril (LOTREL) 5-20 MG capsule TAKE 1 CAPSULE BY MOUTH  DAILY   . aspirin 81 MG tablet Take 1 tablet by mouth daily. 07/08/2014: Received from: [place]: ASPIRIN EC, 81MG  (Oral Tablet Delayed Release)  1 Every Day for 0 days  Quantity: 0.00;  Refills: 0   Ordered :13-Feb-2010  Althea Charon ;  Started 29-May-2008 Active  . Biotin 10 MG CAPS Take by mouth.   . bisoprolol-hydrochlorothiazide (ZIAC) 10-6.25 MG tablet TAKE 1 TABLET BY MOUTH  DAILY   . Cholecalciferol (VITAMIN D) 2000 units CAPS Take by mouth daily.   Marland Kitchen FINACEA 15 % FOAM APPLY TO SKIN BID 09/16/2014: Received from: External Pharmacy  . Magnesium 65 MG TABS Take 1 tablet by mouth daily. 07/08/2014: Received from: [place]: MAGNESIUM, 65MG  (Oral Tablet)  1 Every Day for 0 days  Quantity: 0.00;  Refills: 0   Ordered :13-Feb-2010  Ashley Royalty ;  Started 08-Apr-2009 Active  . Misc Natural Products (GLUCOSAMINE CHONDROITIN COMPLX) TABS Take 1 tablet by mouth daily. 07/08/2014: Received from: [place]:   . Multiple Vitamin (MULTIVITAMIN) tablet Take 1 tablet by mouth daily.   Marland Kitchen omeprazole (PRILOSEC) 20 MG capsule Take 20 mg by mouth daily.   . simvastatin (ZOCOR) 20 MG tablet TAKE 1 TABLET BY MOUTH AT  BEDTIME    No facility-administered encounter medications on file as of 11/03/2017.     No Known Allergies  Review of Systems  Constitutional: Negative for fever and malaise/fatigue.  Eyes: Negative.   Respiratory: Negative for cough and shortness of breath.   Cardiovascular: Negative for chest pain and palpitations.  Gastrointestinal: Negative.   Musculoskeletal: Positive for back pain and neck  pain.       Chronic thoracic back pain. Bra straps leave dents in shoulders.  Skin: Negative.   Endo/Heme/Allergies: Negative.   Psychiatric/Behavioral: Negative.     Objective:  BP (!) 150/88 (BP Location: Right Arm, Patient Position: Sitting, Cuff Size: Normal)   Pulse 74   Temp 97.7 F (36.5 C) (Oral)   Resp 16   Wt 220 lb (99.8 kg)   BMI 37.18 kg/m   Physical Exam  Constitutional: She is oriented to person, place, and time and well-developed, well-nourished, and in no distress.  HENT:  Head: Normocephalic and atraumatic.  Right Ear: External ear normal.  Left Ear: External ear normal.  Nose: Nose normal.  Eyes: Conjunctivae are normal. No scleral icterus.  Neck: No thyromegaly present.  Cardiovascular: Normal rate, regular rhythm and normal heart sounds.  Pulmonary/Chest: Effort normal and breath sounds normal.  Abdominal: Soft.  Musculoskeletal: She exhibits no edema.  Neurological: She is alert and oriented to person, place, and time. Gait normal. GCS score is 15.  Skin: Skin is warm and dry.  Dents in shoulders from bra straps.  Psychiatric: Mood, memory, affect and judgment normal.    Assessment and Plan :  1. Need for influenza vaccination  - Flu vaccine HIGH DOSE PF (Fluzone High dose)  2. Need for shingles vaccine  - Varicella-zoster vaccine IM (Shingrix) 3.Mild obesity Pt considering breast reduction surgery. 4.HTN Double Lotrel.  I have done the exam and reviewed the chart and it is accurate to the best of my knowledge. Development worker, community has been used and  any errors in dictation or transcription are unintentional. Miguel Aschoff M.D. [facility]

## 2017-12-13 ENCOUNTER — Ambulatory Visit (INDEPENDENT_AMBULATORY_CARE_PROVIDER_SITE_OTHER): Payer: 59 | Admitting: Family Medicine

## 2017-12-13 ENCOUNTER — Encounter: Payer: Self-pay | Admitting: Family Medicine

## 2017-12-13 VITALS — BP 124/72 | HR 60 | Temp 98.1°F | Resp 16 | Wt 221.0 lb

## 2017-12-13 DIAGNOSIS — M722 Plantar fascial fibromatosis: Secondary | ICD-10-CM

## 2017-12-13 DIAGNOSIS — M79672 Pain in left foot: Secondary | ICD-10-CM | POA: Diagnosis not present

## 2017-12-13 MED ORDER — NAPROXEN 500 MG PO TABS: 500.0000 mg | ORAL_TABLET | Freq: Two times a day (BID) | ORAL | 1 refills | Status: DC

## 2017-12-13 NOTE — Progress Notes (Signed)
Patient: Sharon Small Female    DOB: November 07, 1950   67 y.o.   MRN: [id number] Visit Date: 12/13/2017  Today's Provider: Wilhemena Durie, MD   Chief Complaint  Patient presents with  . Foot Pain    Left heel   . Knee Pain    Right knee   Subjective:    Foot Pain  This is a new problem. The current episode started 1 to 4 weeks ago. The problem has been gradually worsening. Associated symptoms include arthralgias and joint swelling. Pertinent negatives include no myalgias, neck pain or numbness. The symptoms are aggravated by walking. She has tried NSAIDs for the symptoms.  Knee Pain   There was no injury mechanism. The pain is present in the right knee. Pertinent negatives include no inability to bear weight, loss of motion, loss of sensation, muscle weakness, numbness or tingling. She reports no foreign bodies present.      No Known Allergies   Current Outpatient Medications:  .  amLODipine-benazepril (LOTREL) 10-40 MG capsule, Take 1 capsule by mouth daily., Disp: 90 capsule, Rfl: 3 .  aspirin 81 MG tablet, Take 1 tablet by mouth daily., Disp: , Rfl:  .  Biotin 10 MG CAPS, Take by mouth., Disp: , Rfl:  .  bisoprolol-hydrochlorothiazide (ZIAC) 10-6.25 MG tablet, TAKE 1 TABLET BY MOUTH  DAILY, Disp: 90 tablet, Rfl: 3 .  Cholecalciferol (VITAMIN D) 2000 units CAPS, Take by mouth daily., Disp: , Rfl:  .  FINACEA 15 % FOAM, APPLY TO SKIN BID, Disp: , Rfl: 11 .  Magnesium 65 MG TABS, Take 1 tablet by mouth daily., Disp: , Rfl:  .  Misc Natural Products (GLUCOSAMINE CHONDROITIN COMPLX) TABS, Take 1 tablet by mouth daily., Disp: , Rfl:  .  Multiple Vitamin (MULTIVITAMIN) tablet, Take 1 tablet by mouth daily., Disp: , Rfl:  .  omeprazole (PRILOSEC) 20 MG capsule, Take 20 mg by mouth daily., Disp: , Rfl:  .  simvastatin (ZOCOR) 20 MG tablet, TAKE 1 TABLET BY MOUTH AT  BEDTIME, Disp: 90 tablet, Rfl: 3  Review of Systems  Constitutional: Negative.   HENT: Negative.     Respiratory: Negative.   Cardiovascular: Negative.   Endocrine: Negative.   Musculoskeletal: Positive for arthralgias and joint swelling. Negative for back pain, gait problem, myalgias, neck pain and neck stiffness.  Allergic/Immunologic: Negative.   Neurological: Negative.  Negative for tingling and numbness.  Psychiatric/Behavioral: Negative.     Social History   Tobacco Use  . Smoking status: Never Smoker  . Smokeless tobacco: Never Used  Substance Use Topics  . Alcohol use: No   Objective:   There were no vitals taken for this visit. There were no vitals filed for this visit.   Physical Exam  Constitutional: She is oriented to person, place, and time. She appears well-developed and well-nourished.  HENT:  Head: Normocephalic and atraumatic.  Eyes: Conjunctivae are normal. No scleral icterus.  Neck: No thyromegaly present.  Cardiovascular: Normal rate, regular rhythm and normal heart sounds.  Pulmonary/Chest: Effort normal and breath sounds normal.  Abdominal: Soft.  Musculoskeletal: She exhibits no edema.  Neurological: She is alert and oriented to person, place, and time.  Skin: Skin is warm and dry.  Psychiatric: She has a normal mood and affect. Her behavior is normal. Judgment and thought content normal.        Assessment & Plan:     1. Pain of left heel  - Ambulatory referral to  Podiatry  2. Plantar fasciitis of left foot Naproxw and refer to podiatry.      I have done the exam and reviewed the above chart and it is accurate to the best of my knowledge. Development worker, community has been used in this note in any air is in the dictation or transcription are unintentional.  Wilhemena Durie, MD  [place]

## 2017-12-20 ENCOUNTER — Other Ambulatory Visit: Payer: Self-pay | Admitting: Family Medicine

## 2017-12-20 DIAGNOSIS — Z1231 Encounter for screening mammogram for malignant neoplasm of breast: Secondary | ICD-10-CM

## 2017-12-21 ENCOUNTER — Other Ambulatory Visit: Payer: Self-pay | Admitting: Family Medicine

## 2017-12-29 ENCOUNTER — Telehealth: Payer: Self-pay | Admitting: Family Medicine

## 2017-12-29 MED ORDER — AMLODIPINE BESY-BENAZEPRIL HCL 10-40 MG PO CAPS: 1.0000 | ORAL_CAPSULE | Freq: Every day | ORAL | 3 refills | Status: DC

## 2017-12-29 NOTE — Telephone Encounter (Signed)
Pt call to let Dr. Rosanna Randy know:  [facility] needing the amLODipine-benazepril (LOTREL) 10-40 MG capsule to be called in for pt. Will be faxing another request or can call [facility] at [phone] since the wrong Rx dosage was initially called in.  Please advise.  Thanks, [employer]

## 2018-01-05 ENCOUNTER — Ambulatory Visit: Payer: 59 | Admitting: Family Medicine

## 2018-01-05 VITALS — BP 142/80 | HR 63 | Temp 97.9°F | Resp 16 | Wt 217.0 lb

## 2018-01-05 DIAGNOSIS — Z23 Encounter for immunization: Secondary | ICD-10-CM

## 2018-01-05 DIAGNOSIS — R7303 Prediabetes: Secondary | ICD-10-CM

## 2018-01-05 DIAGNOSIS — E7800 Pure hypercholesterolemia, unspecified: Secondary | ICD-10-CM | POA: Diagnosis not present

## 2018-01-05 DIAGNOSIS — R5381 Other malaise: Secondary | ICD-10-CM

## 2018-01-05 DIAGNOSIS — I10 Essential (primary) hypertension: Secondary | ICD-10-CM | POA: Diagnosis not present

## 2018-01-05 DIAGNOSIS — R5383 Other fatigue: Secondary | ICD-10-CM

## 2018-01-05 MED ORDER — AMLODIPINE BESY-BENAZEPRIL HCL 10-40 MG PO CAPS: 1.0000 | ORAL_CAPSULE | Freq: Every day | ORAL | 3 refills | Status: DC

## 2018-01-05 NOTE — Progress Notes (Signed)
WOODROW DULSKI  MRN: [id number] DOB: 02/17/1950  Subjective:  HPI   The patient is a 67 year old female who presents for follow up of chronic health. She was last seen for this on 07/06/17.   Hypertension BP Readings from Last 3 Encounters:  01/05/18 (!) 142/80  12/13/17 124/72  11/03/17 (!) 150/88     Patient Active Problem List   Diagnosis Date Noted  . Special screening for malignant neoplasms, colon   . Borderline diabetes 07/08/2014  . Muscle ache 07/08/2014  . Snores 07/08/2014  . B12 deficiency 07/08/2014  . Malaise and fatigue 12/26/2008  . Allergic rhinitis 05/29/2008  . Acid reflux 05/29/2008  . Essential (primary) hypertension 05/29/2008  . HLD (hyperlipidemia) 05/29/2008  . Adiposity 05/29/2008  . Arthritis, degenerative 05/29/2008    Past Medical History:  Diagnosis Date  . GERD (gastroesophageal reflux disease)   . Hypertension   . Snores     Social History   Socioeconomic History  . Marital status: Married    Spouse name: Not on file  . Number of children: Not on file  . Years of education: Not on file  . Highest education level: Not on file  Occupational History  . Not on file  Social Needs  . Financial resource strain: Not on file  . Food insecurity:    Worry: Not on file    Inability: Not on file  . Transportation needs:    Medical: Not on file    Non-medical: Not on file  Tobacco Use  . Smoking status: Never Smoker  . Smokeless tobacco: Never Used  Substance and Sexual Activity  . Alcohol use: No  . Drug use: No  . Sexual activity: Yes  Lifestyle  . Physical activity:    Days per week: Not on file    Minutes per session: Not on file  . Stress: Not on file  Relationships  . Social connections:    Talks on phone: Not on file    Gets together: Not on file    Attends religious service: Not on file    Active member of club or organization: Not on file    Attends meetings of clubs or organizations: Not on file   Relationship status: Not on file  . Intimate partner violence:    Fear of current or ex partner: Not on file    Emotionally abused: Not on file    Physically abused: Not on file    Forced sexual activity: Not on file  Other Topics Concern  . Not on file  Social History Narrative  . Not on file    Outpatient Encounter Medications as of 01/05/2018  Medication Sig Note  . amLODipine-benazepril (LOTREL) 10-40 MG capsule Take 1 capsule by mouth daily.   Marland Kitchen aspirin 81 MG tablet Take 1 tablet by mouth daily. 07/08/2014: Received from: [place]: ASPIRIN EC, 81MG  (Oral Tablet Delayed Release)  1 Every Day for 0 days  Quantity: 0.00;  Refills: 0   Ordered :13-Feb-2010  Althea Charon ;  Started 29-May-2008 Active  . Biotin 10 MG CAPS Take by mouth.   . bisoprolol-hydrochlorothiazide (ZIAC) 10-6.25 MG tablet TAKE 1 TABLET BY MOUTH  DAILY   . Cholecalciferol (VITAMIN D) 2000 units CAPS Take by mouth daily.   Marland Kitchen FINACEA 15 % FOAM APPLY TO SKIN BID 09/16/2014: Received from: External Pharmacy  . Magnesium 65 MG TABS Take 1 tablet by mouth daily. 07/08/2014: Received from: [place]  [place]: MAGNESIUM, 65MG  (Oral Tablet)  1 Every Day for 0 days  Quantity: 0.00;  Refills: 0   Ordered :13-Feb-2010  Ashley Royalty ;  Started 08-Apr-2009 Active  . Misc Natural Products (GLUCOSAMINE CHONDROITIN COMPLX) TABS Take 1 tablet by mouth daily. 07/08/2014: Received from: [place]:   . Multiple Vitamin (MULTIVITAMIN) tablet Take 1 tablet by mouth daily.   . naproxen (NAPROSYN) 500 MG tablet Take 1 tablet (500 mg total) by mouth 2 (two) times daily with a meal.   . omeprazole (PRILOSEC) 20 MG capsule Take 20 mg by mouth daily.   . simvastatin (ZOCOR) 20 MG tablet TAKE 1 TABLET BY MOUTH AT  BEDTIME   . [DISCONTINUED] amLODipine-benazepril (LOTREL) 10-40 MG capsule Take 1 capsule by mouth daily.    No facility-administered encounter  medications on file as of 01/05/2018.     No Known Allergies  Review of Systems  Constitutional: Positive for malaise/fatigue. Negative for fever.  HENT: Negative.   Eyes: Negative.   Respiratory: Negative for cough, shortness of breath and wheezing.   Cardiovascular: Negative for chest pain, palpitations, orthopnea, claudication and leg swelling.  Gastrointestinal: Negative.   Skin: Negative.   Neurological: Negative.   Endo/Heme/Allergies: Negative.   Psychiatric/Behavioral: Negative.     Objective:  BP (!) 142/80 (BP Location: Right Arm, Patient Position: Sitting, Cuff Size: Normal)   Pulse 63   Temp 97.9 F (36.6 C) (Oral)   Resp 16   Wt 217 lb (98.4 kg)   SpO2 97%   BMI 36.67 kg/m   Physical Exam  Constitutional: She is oriented to person, place, and time and well-developed, well-nourished, and in no distress.  HENT:  Head: Normocephalic and atraumatic.  Eyes: Conjunctivae are normal. No scleral icterus.  Cardiovascular: Normal rate, regular rhythm and normal heart sounds.  Pulmonary/Chest: Effort normal and breath sounds normal.  Abdominal: Soft.  Musculoskeletal: She exhibits no edema.  Neurological: She is alert and oriented to person, place, and time. Gait normal. GCS score is 15.  Skin: Skin is warm and dry.  Psychiatric: Mood, memory, affect and judgment normal.    Assessment and Plan :   1. Essential (primary) hypertension Pt to work on exercise--RTC 2-3 months. - CBC with Differential/Platelet - Comprehensive metabolic panel  2. Borderline diabetes  - Hemoglobin A1c  3. Pure hypercholesterolemia  - Lipid Panel With LDL/HDL Ratio  4. Malaise and fatigue  - TSH  5. Need for pneumococcal vaccination  - Pneumococcal polysaccharide vaccine 23-valent greater than or equal to 2yo subcutaneous/IM  6. Need for zoster vaccination  - Varicella-zoster vaccine IM (Shingrix)     HPI, Exam and A&P Transcribed under the direction and in the  presence of Wilhemena Durie., MD. Electronically Signed: Althea Charon, RMA I have done the exam and reviewed the chart and it is accurate to the best of my knowledge. Development worker, community has been used and  any errors in dictation or transcription are unintentional. Miguel Aschoff M.D. [facility]

## 2018-01-06 ENCOUNTER — Other Ambulatory Visit: Payer: Self-pay | Admitting: Family Medicine

## 2018-01-06 ENCOUNTER — Telehealth: Payer: Self-pay

## 2018-01-06 MED ORDER — AMLODIPINE BESYLATE 10 MG PO TABS: 10.0000 mg | ORAL_TABLET | Freq: Every day | ORAL | 0 refills | Status: DC

## 2018-01-06 MED ORDER — BENAZEPRIL HCL 40 MG PO TABS: 40.0000 mg | ORAL_TABLET | Freq: Every day | ORAL | 0 refills | Status: DC

## 2018-01-06 NOTE — Telephone Encounter (Signed)
Patient called saying that Lotrel is on back order at the pharmacy, and she will run out of medication tomorrow. She is wondering if we could send it in separately (amlodipine 10mg , benazepril 40mg ) until the pharmacy has the Lotrel back in stock on 11/29? Please advise. Thanks! Dr. Rosanna Randy patient. [facility] [address]

## 2018-01-25 ENCOUNTER — Ambulatory Visit
Admission: RE | Admit: 2018-01-25 | Discharge: 2018-01-25 | Disposition: A | Discharge status: Home / Self Care | Payer: 59 | Source: Ambulatory Visit | Attending: Family Medicine | Admitting: Family Medicine

## 2018-01-25 DIAGNOSIS — Z1231 Encounter for screening mammogram for malignant neoplasm of breast: Secondary | ICD-10-CM | POA: Insufficient documentation

## 2018-01-28 ENCOUNTER — Other Ambulatory Visit: Payer: Self-pay | Admitting: Family Medicine

## 2018-02-06 ENCOUNTER — Ambulatory Visit: Payer: 59 | Admitting: Family Medicine

## 2018-02-06 ENCOUNTER — Other Ambulatory Visit: Payer: Self-pay

## 2018-02-06 ENCOUNTER — Encounter: Payer: Self-pay | Admitting: Family Medicine

## 2018-02-06 VITALS — BP 112/70 | HR 59 | Temp 97.6°F | Ht 65.5 in | Wt 214.8 lb

## 2018-02-06 DIAGNOSIS — J069 Acute upper respiratory infection, unspecified: Secondary | ICD-10-CM | POA: Diagnosis not present

## 2018-02-06 MED ORDER — AMOXICILLIN-POT CLAVULANATE 875-125 MG PO TABS: 1.0000 | ORAL_TABLET | Freq: Two times a day (BID) | ORAL | 0 refills | Status: DC

## 2018-02-06 NOTE — Progress Notes (Signed)
  Subjective:     Patient ID: Sharon Small, female   DOB: [birth year], 67 y.o.   MRN: [id number] Chief Complaint  Patient presents with  . Sinusitis    possible, head congestion, some cough, sinus pressure, and some wheezing started thursday 02/02/18.  not sure of fever   HPI Reports taking Advil for her sx. Primarily concerned about frontal headache and sinus pressure. States she will be going out of town later this week.  Review of Systems     Objective:   Physical Exam Constitutional:      General: She is not in acute distress.    Appearance: Normal appearance. She is not ill-appearing.   Ears: T.M's intact without inflammation Sinuses: non-tender Throat: Mild tonsillar enlargement without erythema or exudate Neck: no cervical adenopathy Lungs: clear     Assessment:    1. URI, acute    Plan:    Discussed use of Mucinex D and Delsym. If sinuses not improving by 12/26 to start Augmentin rx provided.

## 2018-02-06 NOTE — Patient Instructions (Signed)
Discussed use of Mucinex D for congestion and Delsym for cough. Start the antibiotic Thursday if your sinuses are not improving.

## 2018-03-04 LAB — COMPREHENSIVE METABOLIC PANEL WITH GFR
ALT: 23 IU/L (ref 0–32)
AST: 20 IU/L (ref 0–40)
Albumin/Globulin Ratio: 1.7 (ref 1.2–2.2)
Albumin: 4.7 g/dL (ref 3.6–4.8)
Alkaline Phosphatase: 69 IU/L (ref 39–117)
BUN/Creatinine Ratio: 16 (ref 12–28)
BUN: 12 mg/dL (ref 8–27)
Bilirubin Total: 0.4 mg/dL (ref 0.0–1.2)
CO2: 24 mmol/L (ref 20–29)
Calcium: 10.1 mg/dL (ref 8.7–10.3)
Chloride: 101 mmol/L (ref 96–106)
Creatinine, Ser: 0.75 mg/dL (ref 0.57–1.00)
GFR calc Af Amer: 95 mL/min/1.73
GFR calc non Af Amer: 82 mL/min/1.73
Globulin, Total: 2.8 g/dL (ref 1.5–4.5)
Glucose: 94 mg/dL (ref 65–99)
Potassium: 4.5 mmol/L (ref 3.5–5.2)
Sodium: 143 mmol/L (ref 134–144)
Total Protein: 7.5 g/dL (ref 6.0–8.5)

## 2018-03-04 LAB — CBC WITH DIFFERENTIAL/PLATELET
Basophils Absolute: 0 x10E3/uL (ref 0.0–0.2)
Basos: 1 %
EOS (ABSOLUTE): 0.1 x10E3/uL (ref 0.0–0.4)
Eos: 2 %
Hematocrit: 38.3 % (ref 34.0–46.6)
Hemoglobin: 12.9 g/dL (ref 11.1–15.9)
Immature Grans (Abs): 0 x10E3/uL (ref 0.0–0.1)
Immature Granulocytes: 0 %
Lymphocytes Absolute: 1.9 x10E3/uL (ref 0.7–3.1)
Lymphs: 32 %
MCH: 30 pg (ref 26.6–33.0)
MCHC: 33.7 g/dL (ref 31.5–35.7)
MCV: 89 fL (ref 79–97)
Monocytes Absolute: 0.5 x10E3/uL (ref 0.1–0.9)
Monocytes: 9 %
Neutrophils Absolute: 3.2 x10E3/uL (ref 1.4–7.0)
Neutrophils: 56 %
Platelets: 332 x10E3/uL (ref 150–450)
RBC: 4.3 x10E6/uL (ref 3.77–5.28)
RDW: 13 % (ref 11.7–15.4)
WBC: 5.7 x10E3/uL (ref 3.4–10.8)

## 2018-03-04 LAB — TSH: TSH: 4.4 u[IU]/mL (ref 0.450–4.500)

## 2018-03-04 LAB — LIPID PANEL WITH LDL/HDL RATIO
Cholesterol, Total: 175 mg/dL (ref 100–199)
HDL: 52 mg/dL
LDL Calculated: 94 mg/dL (ref 0–99)
LDl/HDL Ratio: 1.8 ratio (ref 0.0–3.2)
Triglycerides: 145 mg/dL (ref 0–149)
VLDL Cholesterol Cal: 29 mg/dL (ref 5–40)

## 2018-03-04 LAB — HEMOGLOBIN A1C
Est. average glucose Bld gHb Est-mCnc: 117 mg/dL
Hgb A1c MFr Bld: 5.7 % — ABNORMAL HIGH (ref 4.8–5.6)

## 2018-03-13 ENCOUNTER — Other Ambulatory Visit: Payer: Self-pay

## 2018-03-13 ENCOUNTER — Ambulatory Visit: Payer: 59 | Admitting: Family Medicine

## 2018-03-13 ENCOUNTER — Encounter: Payer: Self-pay | Admitting: Family Medicine

## 2018-03-13 ENCOUNTER — Telehealth: Payer: Self-pay

## 2018-03-13 VITALS — BP 122/84 | HR 56 | Temp 98.2°F | Ht 65.5 in | Wt 218.2 lb

## 2018-03-13 DIAGNOSIS — J40 Bronchitis, not specified as acute or chronic: Secondary | ICD-10-CM | POA: Diagnosis not present

## 2018-03-13 MED ORDER — HYDROCOD POLST-CPM POLST ER 10-8 MG/5ML PO SUER: 5.0000 mL | Freq: Two times a day (BID) | ORAL | 0 refills | Status: DC | PRN

## 2018-03-13 MED ORDER — LEVOFLOXACIN 500 MG PO TABS: 500.0000 mg | ORAL_TABLET | Freq: Every day | ORAL | 0 refills | Status: DC

## 2018-03-13 NOTE — Patient Instructions (Signed)
Start an expectorant like Robitussin or Mucinex. If your cough is not improving over the course of the week start the antibiotic.

## 2018-03-13 NOTE — Progress Notes (Signed)
  Subjective:     Patient ID: Sharon Small, female   DOB: [birth year], 68 y.o.   MRN: [id number] Chief Complaint  Patient presents with  . Cough    bringing up small amounts of mucus greenish color and very thick since 03/04/18 and has gotten worse   HPI States she had period of wellness after o.v. of 12/23 and course of Augmentin. In the last 8 days she has developed increasingly productive cough with paroxysms of coughing. Denies sinus congestion.  Review of Systems     Objective:   Physical Exam Constitutional:      General: She is not in acute distress.    Appearance: She is not ill-appearing.  Neurological:     Mental Status: She is alert.   Ears:  LeftT.M intact without inflammation; right TM obscured by cerumen Throat: mild tonsillar enlargement without erythema or exudate Neck: no cervical adenopathy Lungs: clear     Assessment:    1. Bronchitis    Plan:    Add expectorants. If cough not improving over the course of the week or high fever to start Levaquin 500 mg. X 7 days. Rx provided.

## 2018-03-13 NOTE — Telephone Encounter (Signed)
Patient advised as below.  

## 2018-03-13 NOTE — Telephone Encounter (Signed)
-----   Message from Jerrol Banana., MD sent at 03/09/2018  1:41 PM EST ----- Labs stable.

## 2018-04-06 ENCOUNTER — Ambulatory Visit: Payer: Self-pay | Admitting: Family Medicine

## 2018-04-18 ENCOUNTER — Encounter: Payer: Self-pay | Admitting: Family Medicine

## 2018-04-18 ENCOUNTER — Ambulatory Visit: Payer: 59 | Admitting: Family Medicine

## 2018-04-18 VITALS — BP 132/80 | HR 68 | Temp 98.3°F | Resp 16 | Ht 66.0 in | Wt 223.0 lb

## 2018-04-18 DIAGNOSIS — E7800 Pure hypercholesterolemia, unspecified: Secondary | ICD-10-CM | POA: Diagnosis not present

## 2018-04-18 DIAGNOSIS — K219 Gastro-esophageal reflux disease without esophagitis: Secondary | ICD-10-CM

## 2018-04-18 DIAGNOSIS — I10 Essential (primary) hypertension: Secondary | ICD-10-CM

## 2018-04-18 DIAGNOSIS — Z6837 Body mass index (BMI) 37.0-37.9, adult: Secondary | ICD-10-CM

## 2018-04-18 NOTE — Progress Notes (Signed)
Patient: Sharon Small Female    DOB: [birth year]   68 y.o.   MRN: [id number] Visit Date: 04/18/2018  Today's Provider: Wilhemena Durie, MD   Chief Complaint  Patient presents with  . Hypertension   Subjective:     HPI   Hypertension, follow-up:  BP Readings from Last 3 Encounters:  04/18/18 132/80  03/13/18 122/84  02/06/18 112/70    She was last seen for hypertension 4 months ago.  BP at that visit was 142/80. Management since that visit includes no changes. She reports good compliance with treatment. She is not having side effects.  She is not exercising. She is adherent to low salt diet.   Outside blood pressures are not being checked. She is experiencing none.  Patient denies exertional chest pressure/discomfort, lower extremity edema and palpitations.   Cardiovascular risk factors include dyslipidemia.   Weight trend: stable Wt Readings from Last 3 Encounters:  04/18/18 223 lb (101.2 kg)  03/13/18 218 lb 3.2 oz (99 kg)  02/06/18 214 lb 12.8 oz (97.4 kg)    Current diet: well balanced  No Known Allergies   Current Outpatient Medications:  .  amLODipine (NORVASC) 10 MG tablet, TAKE 1 TABLET(10 MG) BY MOUTH DAILY, Disp: 90 tablet, Rfl: 3 .  amLODipine-benazepril (LOTREL) 10-40 MG capsule, Take 1 capsule by mouth daily. (Patient not taking: Reported on 02/06/2018), Disp: 90 capsule, Rfl: 3 .  amoxicillin-clavulanate (AUGMENTIN) 875-125 MG tablet, Take 1 tablet by mouth 2 (two) times daily. (Patient not taking: Reported on 03/13/2018), Disp: 20 tablet, Rfl: 0 .  aspirin 81 MG tablet, Take 1 tablet by mouth daily., Disp: , Rfl:  .  benazepril (LOTENSIN) 40 MG tablet, TAKE 1 TABLET(40 MG) BY MOUTH DAILY, Disp: 90 tablet, Rfl: 0 .  Biotin 10 MG CAPS, Take by mouth., Disp: , Rfl:  .  bisoprolol-hydrochlorothiazide (ZIAC) 10-6.25 MG tablet, TAKE 1 TABLET BY MOUTH  DAILY, Disp: 90 tablet, Rfl: 3 .  chlorpheniramine-HYDROcodone (TUSSIONEX PENNKINETIC ER)  10-8 MG/5ML SUER, Take 5 mLs by mouth every 12 (twelve) hours as needed for cough., Disp: 70 mL, Rfl: 0 .  Cholecalciferol (VITAMIN D) 2000 units CAPS, Take by mouth daily., Disp: , Rfl:  .  FINACEA 15 % FOAM, APPLY TO SKIN BID, Disp: , Rfl: 11 .  levofloxacin (LEVAQUIN) 500 MG tablet, Take 1 tablet (500 mg total) by mouth daily., Disp: 7 tablet, Rfl: 0 .  Magnesium 65 MG TABS, Take 1 tablet by mouth daily., Disp: , Rfl:  .  Misc Natural Products (GLUCOSAMINE CHONDROITIN COMPLX) TABS, Take 1 tablet by mouth daily., Disp: , Rfl:  .  Multiple Vitamin (MULTIVITAMIN) tablet, Take 1 tablet by mouth daily., Disp: , Rfl:  .  naproxen (NAPROSYN) 500 MG tablet, Take 1 tablet (500 mg total) by mouth 2 (two) times daily with a meal., Disp: 60 tablet, Rfl: 1 .  omeprazole (PRILOSEC) 20 MG capsule, Take 20 mg by mouth daily., Disp: , Rfl:  .  simvastatin (ZOCOR) 20 MG tablet, TAKE 1 TABLET BY MOUTH AT  BEDTIME, Disp: 90 tablet, Rfl: 3  Review of Systems  Constitutional: Negative for activity change and fatigue.  HENT: Negative.   Eyes: Negative.   Respiratory: Negative for cough and shortness of breath.   Cardiovascular: Negative for chest pain, palpitations and leg swelling.  Endocrine: Negative.   Musculoskeletal: Negative for arthralgias.  Allergic/Immunologic: Negative.   Neurological: Negative for dizziness, light-headedness and headaches.  Hematological: Negative.  Psychiatric/Behavioral: Negative.     Social History   Tobacco Use  . Smoking status: Never Smoker  . Smokeless tobacco: Never Used  Substance Use Topics  . Alcohol use: No      Objective:   BP 132/80 (BP Location: Left Arm, Patient Position: Sitting, Cuff Size: Large)   Pulse 68   Temp 98.3 F (36.8 C)   Resp 16   Ht 5\' 6"  (1.676 m)   Wt 223 lb (101.2 kg)   BMI 35.99 kg/m  Vitals:   04/18/18 1556  BP: 132/80  Pulse: 68  Resp: 16  Temp: 98.3 F (36.8 C)  Weight: 223 lb (101.2 kg)  Height: 5\' 6"  (1.676 m)      Physical Exam Vitals signs reviewed.  HENT:     Head: Normocephalic and atraumatic.     Right Ear: External ear normal.     Left Ear: External ear normal.     Nose: Nose normal.     Mouth/Throat:     Pharynx: Oropharynx is clear.  Eyes:     General: No scleral icterus.    Conjunctiva/sclera: Conjunctivae normal.  Cardiovascular:     Rate and Rhythm: Normal rate and regular rhythm.     Pulses: Normal pulses.     Heart sounds: Normal heart sounds.  Pulmonary:     Effort: Pulmonary effort is normal.     Breath sounds: Normal breath sounds.  Abdominal:     Palpations: Abdomen is soft.  Skin:    General: Skin is warm and dry.  Neurological:     General: No focal deficit present.     Mental Status: She is alert and oriented to person, place, and time. Mental status is at baseline.  Psychiatric:        Mood and Affect: Mood normal.        Behavior: Behavior normal.        Thought Content: Thought content normal.        Judgment: Judgment normal.         Assessment & Plan    1. Essential (primary) hypertension Controlled.  2. Gastroesophageal reflux disease, esophagitis presence not specified On long-term PPI  3. Pure hypercholesterolemia Treated with Zocor.  4. Class 2 severe obesity with serious comorbidity and body mass index (BMI) of 37.0 to 37.9 in adult, unspecified obesity type ([place]) With hypertension, GERD, hyperlipidemia    I have done the exam and reviewed the above chart and it is accurate to the best of my knowledge. Development worker, community has been used in this note in any air is in the dictation or transcription are unintentional.  Wilhemena Durie, MD  [place]

## 2018-04-23 ENCOUNTER — Other Ambulatory Visit: Payer: Self-pay | Admitting: Family Medicine

## 2018-05-08 ENCOUNTER — Other Ambulatory Visit: Payer: Self-pay | Admitting: Family Medicine

## 2018-06-02 ENCOUNTER — Telehealth: Payer: Self-pay

## 2018-06-02 NOTE — Telephone Encounter (Signed)
Called patient from Recall.  No answer. LMOV.  Need to schedule an Evisit.

## 2018-06-09 NOTE — Telephone Encounter (Signed)
Called patient from recall.  Patient refuses an Evisit.  Old recall will be deleted.  New recall placed for Aug.

## 2018-08-15 ENCOUNTER — Other Ambulatory Visit: Payer: Self-pay

## 2018-08-15 ENCOUNTER — Ambulatory Visit: Payer: 59 | Admitting: Family Medicine

## 2018-08-15 ENCOUNTER — Encounter: Payer: Self-pay | Admitting: Family Medicine

## 2018-08-15 VITALS — BP 126/82 | HR 62 | Temp 97.9°F | Resp 18 | Wt 224.2 lb

## 2018-08-15 DIAGNOSIS — M79672 Pain in left foot: Secondary | ICD-10-CM

## 2018-08-15 DIAGNOSIS — S93492A Sprain of other ligament of left ankle, initial encounter: Secondary | ICD-10-CM

## 2018-08-15 MED ORDER — PREDNISONE 10 MG PO TABS: ORAL_TABLET | ORAL | 0 refills | Status: AC

## 2018-08-15 NOTE — Progress Notes (Signed)
Patient: Sharon Small Female    DOB: March 31, 1950   68 y.o.   MRN: [id number] Visit Date: 08/15/2018  Today's Provider: Wilhemena Durie, MD   Chief Complaint  Patient presents with  . Foot Swelling   Subjective:     HPI  Swollen left foot for 1-2 month.  Patient states she has pain on the outside of the foot toward the ankle. She is currently taking Naproxen.   No known injury. No Known Allergies   Current Outpatient Medications:  .  amLODipine (NORVASC) 10 MG tablet, TAKE 1 TABLET(10 MG) BY MOUTH DAILY, Disp: 90 tablet, Rfl: 3 .  amLODipine-benazepril (LOTREL) 10-40 MG capsule, Take 1 capsule by mouth daily., Disp: 90 capsule, Rfl: 3 .  aspirin 81 MG tablet, Take 1 tablet by mouth daily., Disp: , Rfl:  .  benazepril (LOTENSIN) 40 MG tablet, TAKE 1 TABLET BY MOUTH EVERY DAY, Disp: 90 tablet, Rfl: 3 .  Biotin 10 MG CAPS, Take by mouth., Disp: , Rfl:  .  bisoprolol-hydrochlorothiazide (ZIAC) 10-6.25 MG tablet, TAKE 1 TABLET BY MOUTH  DAILY, Disp: 90 tablet, Rfl: 3 .  Cholecalciferol (VITAMIN D) 2000 units CAPS, Take by mouth daily., Disp: , Rfl:  .  FINACEA 15 % FOAM, APPLY TO SKIN BID, Disp: , Rfl: 11 .  Misc Natural Products (GLUCOSAMINE CHONDROITIN COMPLX) TABS, Take 1 tablet by mouth daily., Disp: , Rfl:  .  Multiple Vitamin (MULTIVITAMIN) tablet, Take 1 tablet by mouth daily., Disp: , Rfl:  .  naproxen (NAPROSYN) 500 MG tablet, TAKE 1 TABLET(500 MG) BY MOUTH TWICE DAILY WITH A MEAL, Disp: 60 tablet, Rfl: 1 .  omeprazole (PRILOSEC) 20 MG capsule, Take 20 mg by mouth daily., Disp: , Rfl:  .  simvastatin (ZOCOR) 20 MG tablet, TAKE 1 TABLET BY MOUTH AT  BEDTIME, Disp: 90 tablet, Rfl: 3 .  amoxicillin-clavulanate (AUGMENTIN) 875-125 MG tablet, Take 1 tablet by mouth 2 (two) times daily. (Patient not taking: Reported on 03/13/2018), Disp: 20 tablet, Rfl: 0 .  chlorpheniramine-HYDROcodone (TUSSIONEX PENNKINETIC ER) 10-8 MG/5ML SUER, Take 5 mLs by mouth every 12 (twelve)  hours as needed for cough. (Patient not taking: Reported on 08/15/2018), Disp: 70 mL, Rfl: 0 .  levofloxacin (LEVAQUIN) 500 MG tablet, Take 1 tablet (500 mg total) by mouth daily. (Patient not taking: Reported on 08/15/2018), Disp: 7 tablet, Rfl: 0 .  Magnesium 65 MG TABS, Take 1 tablet by mouth daily., Disp: , Rfl:   Review of Systems  Constitutional: Negative for activity change and fatigue.  HENT: Negative.   Eyes: Negative.   Respiratory: Negative for cough and shortness of breath.   Cardiovascular: Negative for chest pain, palpitations and leg swelling.  Endocrine: Negative.   Musculoskeletal: Negative for arthralgias.  Allergic/Immunologic: Negative.   Neurological: Negative for dizziness, light-headedness and headaches.  Hematological: Negative.   Psychiatric/Behavioral: Negative.   All other systems reviewed and are negative.   Social History   Tobacco Use  . Smoking status: Never Smoker  . Smokeless tobacco: Never Used  Substance Use Topics  . Alcohol use: No      Objective:   BP 126/82 (BP Location: Right Arm, Patient Position: Sitting, Cuff Size: Normal)   Pulse 62   Temp 97.9 F (36.6 C) (Oral)   Resp 18   Wt 224 lb 3.2 oz (101.7 kg)   SpO2 97%   BMI 36.19 kg/m  Vitals:   08/15/18 1608  BP: 126/82  Pulse: 62  Resp: 18  Temp: 97.9 F (36.6 C)  TempSrc: Oral  SpO2: 97%  Weight: 224 lb 3.2 oz (101.7 kg)     Physical Exam Vitals signs reviewed.  Constitutional:      Appearance: She is well-developed.  HENT:     Head: Normocephalic and atraumatic.  Eyes:     General: No scleral icterus.    Conjunctiva/sclera: Conjunctivae normal.  Neck:     Thyroid: No thyromegaly.  Cardiovascular:     Rate and Rhythm: Normal rate and regular rhythm.     Heart sounds: Normal heart sounds.  Pulmonary:     Effort: Pulmonary effort is normal.     Breath sounds: Normal breath sounds.  Abdominal:     Palpations: Abdomen is soft.  Musculoskeletal:     Left lower  leg: No edema.     Comments: No calf swelling but there is swelling of left foot and ankle with 1+ edema--she has a stable ankle and is mildly tender just anterior t and inferior to lateral malleolus.  Skin:    General: Skin is warm and dry.  Neurological:     General: No focal deficit present.     Mental Status: She is alert and oriented to person, place, and time.  Psychiatric:        Mood and Affect: Mood normal.        Behavior: Behavior normal.        Thought Content: Thought content normal.        Judgment: Judgment normal.      No results found for any visits on 08/15/18.     Assessment & Plan    1. Left foot pain  - predniSONE (DELTASONE) 10 MG tablet; Take 1 tablet (10 mg total) by mouth daily with breakfast for 6 days, THEN 1 tablet (10 mg total) daily with breakfast for 6 days. Taper as directed 6-5-4-3-2-1.  Dispense: 21 tablet; Refill: 0 - DG Foot Complete Left; Future  2. Sprain of anterior talofibular ligament of left ankle, initial encounter RTC 2-3 weeks to reassess. She has no pain with weight bearing--a little discomfort when she pivots.     Alexsus Papadopoulos Cranford Mon, MD  [facility]

## 2018-08-15 NOTE — Patient Instructions (Addendum)
Elevate and use compression

## 2018-08-16 ENCOUNTER — Ambulatory Visit
Admission: RE | Admit: 2018-08-16 | Discharge: 2018-08-16 | Disposition: A | Discharge status: Home / Self Care | Payer: 59 | Source: Ambulatory Visit | Attending: Family Medicine | Admitting: Family Medicine

## 2018-08-16 ENCOUNTER — Other Ambulatory Visit: Payer: Self-pay

## 2018-08-16 DIAGNOSIS — M79672 Pain in left foot: Secondary | ICD-10-CM | POA: Insufficient documentation

## 2018-08-17 ENCOUNTER — Telehealth: Payer: Self-pay

## 2018-08-17 NOTE — Telephone Encounter (Signed)
Patient is calling that this is the best number where she can be reach at for the imaging results.  CB# [phone]

## 2018-09-05 ENCOUNTER — Ambulatory Visit: Payer: Self-pay | Admitting: Family Medicine

## 2018-10-08 ENCOUNTER — Other Ambulatory Visit: Payer: Self-pay | Admitting: Family Medicine

## 2018-10-19 ENCOUNTER — Ambulatory Visit: Payer: Self-pay | Admitting: Family Medicine

## 2018-10-25 ENCOUNTER — Ambulatory Visit: Payer: 59 | Admitting: Family Medicine

## 2018-10-25 ENCOUNTER — Other Ambulatory Visit: Payer: Self-pay

## 2018-10-25 ENCOUNTER — Encounter: Payer: Self-pay | Admitting: Family Medicine

## 2018-10-25 VITALS — BP 118/68 | HR 65 | Temp 97.9°F | Resp 18 | Wt 227.6 lb

## 2018-10-25 DIAGNOSIS — R609 Edema, unspecified: Secondary | ICD-10-CM

## 2018-10-25 DIAGNOSIS — Z6837 Body mass index (BMI) 37.0-37.9, adult: Secondary | ICD-10-CM

## 2018-10-25 DIAGNOSIS — I10 Essential (primary) hypertension: Secondary | ICD-10-CM

## 2018-10-25 DIAGNOSIS — Z23 Encounter for immunization: Secondary | ICD-10-CM

## 2018-10-25 DIAGNOSIS — H6121 Impacted cerumen, right ear: Secondary | ICD-10-CM

## 2018-10-25 NOTE — Progress Notes (Signed)
Patient: Sharon Small Female    DOB: [birth year]   68 y.o.   MRN: [id number] Visit Date: 10/25/2018  Today's Provider: Wilhemena Durie, MD   Chief Complaint  Patient presents with  . Follow-up   Subjective:     HPI  6 month follow up. Right ear has been closed from altitude change for about a week. Bilateral ankle swelling, becomes very tight and uncomfortable.    . No pain or fever. She has chronic upper back pain and dents from her bra straps from her large breasts.  No Known Allergies   Current Outpatient Medications:  .  amLODipine (NORVASC) 10 MG tablet, TAKE 1 TABLET(10 MG) BY MOUTH DAILY, Disp: 90 tablet, Rfl: 3 .  aspirin 81 MG tablet, Take 1 tablet by mouth daily., Disp: , Rfl:  .  benazepril (LOTENSIN) 40 MG tablet, TAKE 1 TABLET BY MOUTH EVERY DAY, Disp: 90 tablet, Rfl: 3 .  Biotin 10 MG CAPS, Take by mouth., Disp: , Rfl:  .  bisoprolol-hydrochlorothiazide (ZIAC) 10-6.25 MG tablet, TAKE 1 TABLET BY MOUTH  DAILY, Disp: 90 tablet, Rfl: 3 .  Cholecalciferol (VITAMIN D) 2000 units CAPS, Take by mouth daily., Disp: , Rfl:  .  Magnesium 65 MG TABS, Take 1 tablet by mouth daily., Disp: , Rfl:  .  Misc Natural Products (GLUCOSAMINE CHONDROITIN COMPLX) TABS, Take 1 tablet by mouth daily., Disp: , Rfl:  .  Multiple Vitamin (MULTIVITAMIN) tablet, Take 1 tablet by mouth daily., Disp: , Rfl:  .  omeprazole (PRILOSEC) 20 MG capsule, Take 20 mg by mouth daily., Disp: , Rfl:  .  simvastatin (ZOCOR) 20 MG tablet, TAKE 1 TABLET BY MOUTH AT  BEDTIME, Disp: 90 tablet, Rfl: 3 .  amLODipine-benazepril (LOTREL) 10-40 MG capsule, Take 1 capsule by mouth daily. (Patient not taking: Reported on 10/25/2018), Disp: 90 capsule, Rfl: 3 .  amoxicillin-clavulanate (AUGMENTIN) 875-125 MG tablet, Take 1 tablet by mouth 2 (two) times daily. (Patient not taking: Reported on 03/13/2018), Disp: 20 tablet, Rfl: 0 .  chlorpheniramine-HYDROcodone (TUSSIONEX PENNKINETIC ER) 10-8 MG/5ML SUER, Take 5 mLs  by mouth every 12 (twelve) hours as needed for cough. (Patient not taking: Reported on 08/15/2018), Disp: 70 mL, Rfl: 0 .  FINACEA 15 % FOAM, APPLY TO SKIN BID, Disp: , Rfl: 11 .  levofloxacin (LEVAQUIN) 500 MG tablet, Take 1 tablet (500 mg total) by mouth daily. (Patient not taking: Reported on 08/15/2018), Disp: 7 tablet, Rfl: 0 .  naproxen (NAPROSYN) 500 MG tablet, TAKE 1 TABLET(500 MG) BY MOUTH TWICE DAILY WITH A MEAL (Patient not taking: Reported on 10/25/2018), Disp: 60 tablet, Rfl: 1  Review of Systems  Constitutional: Negative for activity change and fatigue.  HENT: Negative.   Eyes: Negative.   Respiratory: Negative for cough and shortness of breath.   Cardiovascular: Negative for chest pain, palpitations and leg swelling.  Endocrine: Negative.   Musculoskeletal: Positive for back pain and joint swelling. Negative for arthralgias.       Chronic upper back pain at end of day for years.  Allergic/Immunologic: Negative.   Neurological: Negative for dizziness, light-headedness and headaches.  Hematological: Negative.   Psychiatric/Behavioral: Negative.     Social History   Tobacco Use  . Smoking status: Never Smoker  . Smokeless tobacco: Never Used  Substance Use Topics  . Alcohol use: No      Objective:   BP 118/68 (BP Location: Right Arm, Patient Position: Sitting, Cuff Size: Large)  Pulse 65   Temp 97.9 F (36.6 C) (Temporal)   Resp 18   Wt 227 lb 9.6 oz (103.2 kg)   SpO2 98%   BMI 36.74 kg/m  Vitals:   10/25/18 1550  BP: 118/68  Pulse: 65  Resp: 18  Temp: 97.9 F (36.6 C)  TempSrc: Temporal  SpO2: 98%  Weight: 227 lb 9.6 oz (103.2 kg)  Body mass index is 36.74 kg/m.   Physical Exam Vitals signs reviewed.  HENT:     Head: Normocephalic and atraumatic.     Right Ear: External ear normal.     Left Ear: Tympanic membrane, ear canal and external ear normal.     Ears:     Comments: Right TM blocked by cerumen.    Nose: Nose normal.     Mouth/Throat:      Pharynx: Oropharynx is clear.  Eyes:     General: No scleral icterus.    Conjunctiva/sclera: Conjunctivae normal.  Cardiovascular:     Rate and Rhythm: Normal rate and regular rhythm.     Pulses: Normal pulses.     Heart sounds: Normal heart sounds.  Pulmonary:     Effort: Pulmonary effort is normal.     Breath sounds: Normal breath sounds.  Abdominal:     Palpations: Abdomen is soft.  Musculoskeletal:     Right lower leg: Edema present.     Left lower leg: Edema present.     Comments: 1+ ankle/pedal edema.  Skin:    General: Skin is warm and dry.  Neurological:     General: No focal deficit present.     Mental Status: She is alert and oriented to person, place, and time. Mental status is at baseline.  Psychiatric:        Mood and Affect: Mood normal.        Behavior: Behavior normal.        Thought Content: Thought content normal.        Judgment: Judgment normal.      No results found for any visits on 10/25/18.     Assessment & Plan    1. Need for immunization against influenza  - Flu vaccine HIGH DOSE PF  2. Essential (primary) hypertension   3. Class 2 severe obesity with serious comorbidity and body mass index (BMI) of 37.0 to 37.9 in adult, unspecified obesity type ([place])   4. Excessive cerumen in ear canal, right Removed and hearing better. 5.Dependent Edema Stop amlodipine and RTC 1 month. Workup as indicated.    Richard Cranford Mon, MD  [facility]

## 2018-10-25 NOTE — Patient Instructions (Signed)
Stop taking Amlodipine.

## 2018-11-28 ENCOUNTER — Other Ambulatory Visit: Payer: Self-pay

## 2018-11-28 ENCOUNTER — Ambulatory Visit: Payer: 59 | Admitting: Family Medicine

## 2018-11-28 ENCOUNTER — Encounter: Payer: Self-pay | Admitting: Family Medicine

## 2018-11-28 VITALS — BP 133/75 | HR 62 | Temp 96.8°F | Resp 18 | Wt 228.0 lb

## 2018-11-28 DIAGNOSIS — R600 Localized edema: Secondary | ICD-10-CM

## 2018-11-28 DIAGNOSIS — I10 Essential (primary) hypertension: Secondary | ICD-10-CM | POA: Diagnosis not present

## 2018-11-28 DIAGNOSIS — E7800 Pure hypercholesterolemia, unspecified: Secondary | ICD-10-CM | POA: Diagnosis not present

## 2018-11-28 NOTE — Progress Notes (Signed)
Patient: Sharon Small Female    DOB: 13-Dec-1950   68 y.o.   MRN: [id number] Visit Date: 11/28/2018  Today's Provider: Wilhemena Durie, MD   Chief Complaint  Patient presents with  . Follow-up   Subjective:     HPI 1 month follow up for discontinuing hypertension medication.  Her ankle and pedal edema has resolved off of amlodipine.  She feels well.  No Known Allergies   Current Outpatient Medications:  .  amLODipine (NORVASC) 10 MG tablet, TAKE 1 TABLET(10 MG) BY MOUTH DAILY, Disp: 90 tablet, Rfl: 3 .  amLODipine-benazepril (LOTREL) 10-40 MG capsule, Take 1 capsule by mouth daily. (Patient not taking: Reported on 10/25/2018), Disp: 90 capsule, Rfl: 3 .  amoxicillin-clavulanate (AUGMENTIN) 875-125 MG tablet, Take 1 tablet by mouth 2 (two) times daily. (Patient not taking: Reported on 03/13/2018), Disp: 20 tablet, Rfl: 0 .  aspirin 81 MG tablet, Take 1 tablet by mouth daily., Disp: , Rfl:  .  benazepril (LOTENSIN) 40 MG tablet, TAKE 1 TABLET BY MOUTH EVERY DAY, Disp: 90 tablet, Rfl: 3 .  Biotin 10 MG CAPS, Take by mouth., Disp: , Rfl:  .  bisoprolol-hydrochlorothiazide (ZIAC) 10-6.25 MG tablet, TAKE 1 TABLET BY MOUTH  DAILY, Disp: 90 tablet, Rfl: 3 .  chlorpheniramine-HYDROcodone (TUSSIONEX PENNKINETIC ER) 10-8 MG/5ML SUER, Take 5 mLs by mouth every 12 (twelve) hours as needed for cough. (Patient not taking: Reported on 08/15/2018), Disp: 70 mL, Rfl: 0 .  Cholecalciferol (VITAMIN D) 2000 units CAPS, Take by mouth daily., Disp: , Rfl:  .  FINACEA 15 % FOAM, APPLY TO SKIN BID, Disp: , Rfl: 11 .  levofloxacin (LEVAQUIN) 500 MG tablet, Take 1 tablet (500 mg total) by mouth daily. (Patient not taking: Reported on 08/15/2018), Disp: 7 tablet, Rfl: 0 .  Magnesium 65 MG TABS, Take 1 tablet by mouth daily., Disp: , Rfl:  .  Misc Natural Products (GLUCOSAMINE CHONDROITIN COMPLX) TABS, Take 1 tablet by mouth daily., Disp: , Rfl:  .  Multiple Vitamin (MULTIVITAMIN) tablet, Take 1 tablet  by mouth daily., Disp: , Rfl:  .  naproxen (NAPROSYN) 500 MG tablet, TAKE 1 TABLET(500 MG) BY MOUTH TWICE DAILY WITH A MEAL (Patient not taking: Reported on 10/25/2018), Disp: 60 tablet, Rfl: 1 .  omeprazole (PRILOSEC) 20 MG capsule, Take 20 mg by mouth daily., Disp: , Rfl:  .  simvastatin (ZOCOR) 20 MG tablet, TAKE 1 TABLET BY MOUTH AT  BEDTIME, Disp: 90 tablet, Rfl: 3  Review of Systems  Constitutional: Negative for activity change and fatigue.  HENT: Negative.   Eyes: Negative.   Respiratory: Negative for cough and shortness of breath.   Cardiovascular: Negative for chest pain, palpitations and leg swelling.  Endocrine: Negative.   Musculoskeletal: Negative for arthralgias.  Allergic/Immunologic: Negative.   Neurological: Negative for dizziness, light-headedness and headaches.  Hematological: Negative.   Psychiatric/Behavioral: Negative.     Social History   Tobacco Use  . Smoking status: Never Smoker  . Smokeless tobacco: Never Used  Substance Use Topics  . Alcohol use: No      Objective:   BP 133/75 (BP Location: Right Arm, Patient Position: Sitting, Cuff Size: Large)   Pulse 62   Temp (!) 96.8 F (36 C) (Temporal)   Resp 18   Wt 228 lb (103.4 kg)   SpO2 97%   BMI 36.80 kg/m  Vitals:   11/28/18 1449  BP: 133/75  Pulse: 62  Resp: 18  Temp: (!)  96.8 F (36 C)  TempSrc: Temporal  SpO2: 97%  Weight: 228 lb (103.4 kg)  Body mass index is 36.8 kg/m.   Physical Exam Vitals signs reviewed.  HENT:     Head: Normocephalic and atraumatic.     Right Ear: External ear normal.     Left Ear: External ear normal.     Nose: Nose normal.     Mouth/Throat:     Pharynx: Oropharynx is clear.  Eyes:     General: No scleral icterus.    Conjunctiva/sclera: Conjunctivae normal.  Cardiovascular:     Rate and Rhythm: Normal rate and regular rhythm.     Pulses: Normal pulses.     Heart sounds: Normal heart sounds.  Pulmonary:     Effort: Pulmonary effort is normal.      Breath sounds: Normal breath sounds.  Abdominal:     Palpations: Abdomen is soft.  Musculoskeletal:     Right lower leg: No edema.     Left lower leg: No edema.  Skin:    General: Skin is warm and dry.  Neurological:     General: No focal deficit present.     Mental Status: She is alert and oriented to person, place, and time. Mental status is at baseline.  Psychiatric:        Mood and Affect: Mood normal.        Behavior: Behavior normal.        Thought Content: Thought content normal.        Judgment: Judgment normal.      No results found for any visits on 11/28/18.     Assessment & Plan    1. Essential (primary) hypertension Still controlled on benazepril  2. Pure hypercholesterolemia On simvastatin.  3. Localized edema Pedal edema resolved off of amlodipine.  Return to clinic 6 months     Wilhemena Durie, MD  [facility]

## 2018-11-30 ENCOUNTER — Other Ambulatory Visit: Payer: Self-pay | Admitting: Family Medicine

## 2018-11-30 MED ORDER — BISOPROLOL-HYDROCHLOROTHIAZIDE 10-6.25 MG PO TABS: 1.0000 | ORAL_TABLET | Freq: Every day | ORAL | 3 refills | Status: DC

## 2018-11-30 NOTE — Telephone Encounter (Signed)
Pt asking for all future refills of herbisoprolol-hydrochlorothiazide (ZIAC) 10-6.25 MG tablet   to be filled with [facility].  [facility] [id number], [place] [phone] (Phone) [phone] (Fax)   Pt is no longer w/  [facility]  Thanks, [employer]

## 2018-11-30 NOTE — Telephone Encounter (Signed)
Pt is needing   bisoprolol-hydrochlorothiazide (ZIAC) 10-6.25 MG tablet  Back order through [facility]  [place] [id number], [place] [phone] (Phone) [phone] (Fax)

## 2018-12-22 ENCOUNTER — Telehealth: Payer: Self-pay | Admitting: *Deleted

## 2018-12-22 ENCOUNTER — Other Ambulatory Visit: Payer: Self-pay

## 2018-12-22 DIAGNOSIS — Z20822 Contact with and (suspected) exposure to covid-19: Secondary | ICD-10-CM

## 2018-12-22 NOTE — Telephone Encounter (Signed)
Patient states she has had diarrhea and nausea for 3 days. Patient states she has had no appetite. Patient wanted to know if someone could send in a medication to help with diarrhea or advise what she should do? Patient is aware Dr. Rosanna Randy is out today. Please advise?

## 2018-12-22 NOTE — Telephone Encounter (Signed)
Tried calling patient on work and home number. Left message to call back.

## 2018-12-22 NOTE — Telephone Encounter (Signed)
Patient was advised. Patient also stated she went to a covid test today.

## 2018-12-22 NOTE — Telephone Encounter (Signed)
She can take OTC imodium. Avoid all dairy products and fruit juices. Go to ER if any fever over 101, severe abdominal pain or blood in stool.

## 2018-12-23 ENCOUNTER — Telehealth: Payer: Self-pay

## 2018-12-23 NOTE — Telephone Encounter (Signed)
Pt called for covid test results advised results are not back

## 2018-12-24 LAB — NOVEL CORONAVIRUS, NAA: SARS-CoV-2, NAA: NOT DETECTED

## 2018-12-25 ENCOUNTER — Telehealth: Payer: Self-pay | Admitting: Family Medicine

## 2018-12-25 NOTE — Telephone Encounter (Signed)
Please advise? There is more concerning this in phone message from 12/22/2018.

## 2018-12-25 NOTE — Telephone Encounter (Signed)
Add probiotic twice a day for now if no other symptoms

## 2018-12-25 NOTE — Telephone Encounter (Signed)
Pt needing to discuss having another bout with diarrhea this morning. Was doing better and started to add things in her diet.  She is now back to having diarrhea.  Please call pt back to advise at [phone].  Thanks, [employer]

## 2018-12-25 NOTE — Telephone Encounter (Signed)
Advised patient as below.  

## 2019-01-16 ENCOUNTER — Other Ambulatory Visit: Payer: Self-pay | Admitting: Family Medicine

## 2019-01-16 DIAGNOSIS — Z1231 Encounter for screening mammogram for malignant neoplasm of breast: Secondary | ICD-10-CM

## 2019-01-20 ENCOUNTER — Other Ambulatory Visit: Payer: Self-pay | Admitting: Family Medicine

## 2019-01-29 ENCOUNTER — Ambulatory Visit
Admission: RE | Admit: 2019-01-29 | Discharge: 2019-01-29 | Disposition: A | Discharge status: Home / Self Care | Payer: 59 | Source: Ambulatory Visit | Attending: Family Medicine | Admitting: Family Medicine

## 2019-01-29 DIAGNOSIS — Z1231 Encounter for screening mammogram for malignant neoplasm of breast: Secondary | ICD-10-CM | POA: Insufficient documentation

## 2019-02-01 ENCOUNTER — Other Ambulatory Visit: Payer: Self-pay | Admitting: *Deleted

## 2019-02-05 ENCOUNTER — Other Ambulatory Visit: Payer: Self-pay | Admitting: Family Medicine

## 2019-02-06 NOTE — Telephone Encounter (Signed)
Requested Prescriptions  Pending Prescriptions Disp Refills  . bisoprolol-hydrochlorothiazide (ZIAC) 10-6.25 MG tablet [Pharmacy Med Name: BISO FUM/HCT 10-6.25MG  TABLET] 90 tablet 3    Sig: TAKE 1 TABLET BY MOUTH  DAILY     Cardiovascular: Beta Blocker + Diuretic Combos Failed - 02/05/2019  9:17 PM      Failed - K in normal range and within 180 days    Potassium  Date Value Ref Range Status  03/03/2018 4.5 3.5 - 5.2 mmol/L Final         Failed - Na in normal range and within 180 days    Sodium  Date Value Ref Range Status  03/03/2018 143 134 - 144 mmol/L Final         Failed - Cr in normal range and within 180 days    Creatinine, Ser  Date Value Ref Range Status  03/03/2018 0.75 0.57 - 1.00 mg/dL Final         Failed - Ca in normal range and within 180 days    Calcium  Date Value Ref Range Status  03/03/2018 10.1 8.7 - 10.3 mg/dL Final         Passed - Patient is not pregnant      Passed - Last BP in normal range    BP Readings from Last 1 Encounters:  11/28/18 133/75         Passed - Last Heart Rate in normal range    Pulse Readings from Last 1 Encounters:  11/28/18 62         Passed - Valid encounter within last 6 months    Recent Outpatient Visits          2 months ago Essential (primary) hypertension   [facility] Jerrol Banana., MD   3 months ago Need for immunization against influenza   [facility] Jerrol Banana., MD   5 months ago Left foot pain   [facility] Jerrol Banana., MD   9 months ago Essential (primary) hypertension   [facility] Jerrol Banana., MD   11 months ago [place], [place], [place]      Future Appointments            In 3 months Rosanna Randy, Retia Passe., MD [facility], [place]

## 2019-04-19 ENCOUNTER — Other Ambulatory Visit: Payer: Self-pay | Admitting: Family Medicine

## 2019-04-19 NOTE — Telephone Encounter (Signed)
Requested Prescriptions  Pending Prescriptions Disp Refills  . benazepril (LOTENSIN) 40 MG tablet [Pharmacy Med Name: BENAZEPRIL 40MG  TABLETS] 30 tablet 0    Sig: TAKE 1 TABLET BY MOUTH EVERY DAY     Cardiovascular:  ACE Inhibitors Failed - 04/19/2019  3:39 AM      Failed - Cr in normal range and within 180 days    Creatinine, Ser  Date Value Ref Range Status  03/03/2018 0.75 0.57 - 1.00 mg/dL Final         Failed - K in normal range and within 180 days    Potassium  Date Value Ref Range Status  03/03/2018 4.5 3.5 - 5.2 mmol/L Final         Passed - Patient is not pregnant      Passed - Last BP in normal range    BP Readings from Last 1 Encounters:  11/28/18 133/75         Passed - Valid encounter within last 6 months    Recent Outpatient Visits          4 months ago Essential (primary) hypertension   [facility] Jerrol Banana., MD   5 months ago Need for immunization against influenza   [facility] Jerrol Banana., MD   8 months ago Left foot pain   [facility] Jerrol Banana., MD   1 year ago Essential (primary) hypertension   [facility] Jerrol Banana., MD   1 year ago [place], [place]      Future Appointments            In 1 month Jerrol Banana., MD [facility], [place]

## 2019-04-30 ENCOUNTER — Other Ambulatory Visit: Payer: Self-pay | Admitting: Family Medicine

## 2019-05-24 NOTE — Progress Notes (Deleted)
Established patient visit   {  This note template is in development as part of the [facility].   This template contains optional SmartLists. If no selections are made from an optional SmartList, it will be automatically removed when the note is signed.  Left clicking SmartLists that are in blue, underlined text will show additional information regarding the patient's history unless you are already editing the note in a pop-up window.  This text will be automatically removed from this note when it is signed.:1}   Patient: Sharon Small   DOB: 19-May-1950   69 y.o. Female  MRN: [id number] Visit Date: 05/24/2019  Today's healthcare provider: Wilhemena Durie, MD  Subjective:   No chief complaint on file.  HPI   Hypertension, follow-up:  BP Readings from Last 3 Encounters:  11/28/18 133/75  10/25/18 118/68  08/15/18 126/82    She was last seen for hypertension 6 months ago.  BP at that visit was 133/78. Management since that visit includes; Still controlled on benazepril .She reports {excellent/good/fair/poor:19665} compliance with treatment. She {ACTION; IS/IS GI:087931 having side effects. *** She {is/is not:9024} exercising. She {is/is not:9024} adherent to low salt diet.   Outside blood pressures are ***. She is experiencing {Symptoms; cardiac:12860}.  Patient denies {Symptoms; cardiac:12860}.   Cardiovascular risk factors include {cv risk factors:510}.  Use of agents associated with hypertension: {bp agents assoc with hypertension:511::"none"}.   ------------------------------------------------------------------------    Lipid/Cholesterol, Follow-up:   Last seen for this 6 months ago.  Management since that visit includes; on simvastatin.  Last Lipid Panel:    Component Value Date/Time   CHOL 175 03/03/2018 0708   TRIG 145 03/03/2018 0708   HDL 52 03/03/2018 0708   LDLCALC 94 03/03/2018 0708    She reports  {excellent/good/fair/poor:19665} compliance with treatment. She {ACTION; IS/IS GI:087931 having side effects. ***  Wt Readings from Last 3 Encounters:  11/28/18 228 lb (103.4 kg)  10/25/18 227 lb 9.6 oz (103.2 kg)  08/15/18 224 lb 3.2 oz (101.7 kg)    ------------------------------------------------------------------------  Localized edema From 11/28/2018-Pedal edema resolved off of amlodipine.    {Show patient history (optional):23778::" "}   Medications: Outpatient Medications Prior to Visit  Medication Sig  . amLODipine (NORVASC) 10 MG tablet TAKE 1 TABLET(10 MG) BY MOUTH DAILY  . amLODipine-benazepril (LOTREL) 10-40 MG capsule Take 1 capsule by mouth daily. (Patient not taking: Reported on 10/25/2018)  . amoxicillin-clavulanate (AUGMENTIN) 875-125 MG tablet Take 1 tablet by mouth 2 (two) times daily. (Patient not taking: Reported on 03/13/2018)  . aspirin 81 MG tablet Take 1 tablet by mouth daily.  . benazepril (LOTENSIN) 40 MG tablet TAKE 1 TABLET BY MOUTH EVERY DAY  . Biotin 10 MG CAPS Take by mouth.  . bisoprolol-hydrochlorothiazide (ZIAC) 10-6.25 MG tablet TAKE 1 TABLET BY MOUTH  DAILY  . chlorpheniramine-HYDROcodone (TUSSIONEX PENNKINETIC ER) 10-8 MG/5ML SUER Take 5 mLs by mouth every 12 (twelve) hours as needed for cough. (Patient not taking: Reported on 08/15/2018)  . Cholecalciferol (VITAMIN D) 2000 units CAPS Take by mouth daily.  Marland Kitchen FINACEA 15 % FOAM APPLY TO SKIN BID  . levofloxacin (LEVAQUIN) 500 MG tablet Take 1 tablet (500 mg total) by mouth daily. (Patient not taking: Reported on 08/15/2018)  . Magnesium 65 MG TABS Take 1 tablet by mouth daily.  . Misc Natural Products (GLUCOSAMINE CHONDROITIN COMPLX) TABS Take 1 tablet by mouth daily.  . Multiple Vitamin (MULTIVITAMIN) tablet Take 1 tablet by mouth daily.  Marland Kitchen  naproxen (NAPROSYN) 500 MG tablet TAKE 1 TABLET(500 MG) BY MOUTH TWICE DAILY WITH A MEAL (Patient not taking: Reported on 10/25/2018)  . omeprazole (PRILOSEC)  20 MG capsule Take 20 mg by mouth daily.  . simvastatin (ZOCOR) 20 MG tablet TAKE 1 TABLET BY MOUTH AT  BEDTIME   No facility-administered medications prior to visit.    Review of Systems  Constitutional: Negative for appetite change, chills, fatigue and fever.  Respiratory: Negative for chest tightness and shortness of breath.   Cardiovascular: Negative for chest pain and palpitations.  Gastrointestinal: Negative for abdominal pain, nausea and vomiting.  Neurological: Negative for dizziness and weakness.    {Show previous labs (optional):23779::" "}    Objective:    There were no vitals taken for this visit. {Show previous vital signs (optional):23777::" "}  Physical Exam  ***  No results found for any visits on 05/29/19.    Assessment & Plan:    ***     Wilhemena Durie, MD  [facility] [phone] (phone) [phone] (fax)  [place]

## 2019-05-28 ENCOUNTER — Other Ambulatory Visit: Payer: Self-pay | Admitting: Family Medicine

## 2019-05-28 MED ORDER — BENAZEPRIL HCL 40 MG PO TABS: 40.0000 mg | ORAL_TABLET | Freq: Every day | ORAL | 0 refills | Status: DC

## 2019-05-28 NOTE — Telephone Encounter (Signed)
benazepril (LOTENSIN) 40 MG tablet     Patient is requesting 30 day supply.     Pharmacy:  [facility] [id number], [place] Phone:  [phone]  Fax:  [phone]

## 2019-05-28 NOTE — Telephone Encounter (Signed)
Pt < 3 months overdue for appt and pt has upcoming appt. 90 day courtesy refill given. Requested Prescriptions  Pending Prescriptions Disp Refills  . benazepril (LOTENSIN) 40 MG tablet 90 tablet 0    Sig: Take 1 tablet (40 mg total) by mouth daily.     Cardiovascular:  ACE Inhibitors Failed - 05/28/2019  9:51 AM      Failed - Cr in normal range and within 180 days    Creatinine, Ser  Date Value Ref Range Status  03/03/2018 0.75 0.57 - 1.00 mg/dL Final         Failed - K in normal range and within 180 days    Potassium  Date Value Ref Range Status  03/03/2018 4.5 3.5 - 5.2 mmol/L Final         Failed - Valid encounter within last 6 months    Recent Outpatient Visits          6 months ago Essential (primary) hypertension   [facility] Jerrol Banana., MD   7 months ago Need for immunization against influenza   [facility] Jerrol Banana., MD   9 months ago Left foot pain   [facility] Jerrol Banana., MD   1 year ago Essential (primary) hypertension   [facility] Jerrol Banana., MD   1 year ago [place], [place]      Future Appointments            In 1 month Jerrol Banana., MD [facility], [place] - Patient is not pregnant      Passed - Last BP in normal range    BP Readings from Last 1 Encounters:  11/28/18 133/75

## 2019-05-29 ENCOUNTER — Ambulatory Visit: Payer: Self-pay | Admitting: Family Medicine

## 2019-06-29 NOTE — Progress Notes (Signed)
Established patient visit   Patient: Sharon Small   DOB: 11/25/1950   69 y.o. Female  MRN: [id number] Visit Date: 07/04/2019  Today's healthcare provider: Wilhemena Durie, MD   Chief Complaint  Patient presents with  . Hypertension  . Hyperlipidemia  . Edema   I,Latasha Walston,acting as a scribe for Wilhemena Durie, MD.,have documented all relevant documentation on the behalf of Wilhemena Durie, MD,as directed by  Wilhemena Durie, MD while in the presence of Wilhemena Durie, MD.  Subjective    HPI Patient retired earlier this month after 69 years old [employer].  She has had both Covid vaccines.  She fell this spring and injured her left elbow.  This injury has resolved. Hypertension, follow-up  BP Readings from Last 3 Encounters:  07/04/19 126/81  11/28/18 133/75  10/25/18 118/68   Wt Readings from Last 3 Encounters:  07/04/19 220 lb 9.6 oz (100.1 kg)  11/28/18 228 lb (103.4 kg)  10/25/18 227 lb 9.6 oz (103.2 kg)     She was last seen for hypertension 7 months ago.  BP at that visit was 133/75. Management since that visit includes; Still controlled on benazepril. She reports good compliance with treatment. She is not having side effects.  She is not exercising. She is not adherent to low salt diet.   Outside blood pressures are not being checked at home.  She does not smoke.  Use of agents associated with hypertension: none.   --------------------------------------------------------------------------------------------------- Lipid/Cholesterol, follow-up  Last Lipid Panel: Lab Results  Component Value Date   CHOL 175 03/03/2018   LDLCALC 94 03/03/2018   HDL 52 03/03/2018   TRIG 145 03/03/2018    She was last seen for this 03/03/2018 months ago.  Management since that visit includes; labs checked showing-stable. She reports good compliance with treatment. She is not having side effects.  She is following a Regular diet. Current  exercise: none  Last metabolic panel Lab Results  Component Value Date   GLUCOSE 94 03/03/2018   NA 143 03/03/2018   K 4.5 03/03/2018   BUN 12 03/03/2018   CREATININE 0.75 03/03/2018   GFRNONAA 82 03/03/2018   GFRAA 95 03/03/2018   CALCIUM 10.1 03/03/2018   AST 20 03/03/2018   ALT 23 03/03/2018   The 10-year ASCVD risk score Mikey Bussing DC Jr., et al., 2013) is: 10.7%  ---------------------------------------------------------------------------------------------------  Localized edema: From 11/28/2018-Pedal edema resolved off of amlodipine     Medications: Outpatient Medications Prior to Visit  Medication Sig  . aspirin 81 MG tablet Take 1 tablet by mouth daily.  . benazepril (LOTENSIN) 40 MG tablet Take 1 tablet (40 mg total) by mouth daily.  . Biotin 10 MG CAPS Take by mouth.  . bisoprolol-hydrochlorothiazide (ZIAC) 10-6.25 MG tablet TAKE 1 TABLET BY MOUTH  DAILY  . Cholecalciferol (VITAMIN D) 2000 units CAPS Take by mouth daily.  Marland Kitchen FINACEA 15 % FOAM APPLY TO SKIN BID  . Magnesium 65 MG TABS Take 1 tablet by mouth daily.  . Misc Natural Products (GLUCOSAMINE CHONDROITIN COMPLX) TABS Take 1 tablet by mouth daily.  . Multiple Vitamin (MULTIVITAMIN) tablet Take 1 tablet by mouth daily.  . naproxen (NAPROSYN) 500 MG tablet TAKE 1 TABLET(500 MG) BY MOUTH TWICE DAILY WITH A MEAL  . omeprazole (PRILOSEC) 20 MG capsule Take 20 mg by mouth daily.  . simvastatin (ZOCOR) 20 MG tablet TAKE 1 TABLET BY MOUTH AT  BEDTIME  . [DISCONTINUED] amoxicillin-clavulanate (AUGMENTIN) 875-125 MG  tablet Take 1 tablet by mouth 2 (two) times daily.  . [DISCONTINUED] amLODipine (NORVASC) 10 MG tablet TAKE 1 TABLET(10 MG) BY MOUTH DAILY (Patient not taking: Reported on 07/04/2019)  . [DISCONTINUED] amLODipine-benazepril (LOTREL) 10-40 MG capsule Take 1 capsule by mouth daily. (Patient not taking: Reported on 10/25/2018)  . [DISCONTINUED] chlorpheniramine-HYDROcodone (TUSSIONEX PENNKINETIC ER) 10-8 MG/5ML SUER  Take 5 mLs by mouth every 12 (twelve) hours as needed for cough. (Patient not taking: Reported on 08/15/2018)  . [DISCONTINUED] levofloxacin (LEVAQUIN) 500 MG tablet Take 1 tablet (500 mg total) by mouth daily. (Patient not taking: Reported on 08/15/2018)   No facility-administered medications prior to visit.    Review of Systems  Constitutional: Negative for appetite change, chills, fatigue and fever.  Eyes: Negative.   Respiratory: Negative for chest tightness and shortness of breath.   Cardiovascular: Negative for chest pain and palpitations.  Gastrointestinal: Negative for abdominal pain, nausea and vomiting.  Endocrine: Negative.   Allergic/Immunologic: Negative.   Neurological: Negative for dizziness and weakness.  Hematological: Negative.   Psychiatric/Behavioral: Negative.       Objective    BP 126/81 (BP Location: Right Arm, Patient Position: Sitting, Cuff Size: Large)   Pulse 61   Temp (!) 97.1 F (36.2 C) (Temporal)   Wt 220 lb 9.6 oz (100.1 kg)   BMI 35.61 kg/m    Physical Exam Vitals reviewed.  HENT:     Head: Normocephalic and atraumatic.     Right Ear: External ear normal.     Left Ear: External ear normal.     Nose: Nose normal.     Mouth/Throat:     Pharynx: Oropharynx is clear.  Eyes:     General: No scleral icterus.    Conjunctiva/sclera: Conjunctivae normal.  Cardiovascular:     Rate and Rhythm: Normal rate and regular rhythm.     Pulses: Normal pulses.     Heart sounds: Normal heart sounds.  Pulmonary:     Effort: Pulmonary effort is normal.     Breath sounds: Normal breath sounds.  Abdominal:     Palpations: Abdomen is soft.  Musculoskeletal:     Right lower leg: No edema.     Left lower leg: No edema.  Skin:    General: Skin is warm and dry.  Neurological:     General: No focal deficit present.     Mental Status: She is alert and oriented to person, place, and time. Mental status is at baseline.  Psychiatric:        Mood and Affect:  Mood normal.        Behavior: Behavior normal.        Thought Content: Thought content normal.        Judgment: Judgment normal.      General: Appearance:    Obese female in no acute distress  Eyes:    PERRL, conjunctiva/corneas clear, EOM's intact       Lungs:     Clear to auscultation bilaterally, respirations unlabored  Heart:    Normal heart rate. Normal rhythm. No murmurs, rubs, or gallops.   MS:   All extremities are intact.   Neurologic:   Awake, alert, oriented x 3. No apparent focal neurological           defect.       No results found for any visits on 07/04/19.  Assessment & Plan    1. Essential (primary) hypertension Well controlled Continue current medications Recheck metabolic panel F/u in 6  months  - CBC with Differential - TSH - Comprehensive Metabolic Panel (CMET)  2. Localized edema   3. Pure hypercholesterolemia Previously well controlled Continue statin Repeat FLP and CMP   - Lipid Profile   No follow-ups on file.      I, Wilhemena Durie, MD, have reviewed all documentation for this visit. The documentation on 07/07/19 for the exam, diagnosis, procedures, and orders are all accurate and complete.    Jalynn Waddell Cranford Mon, MD  [facility] [phone] (phone) [phone] (fax)  [place]

## 2019-07-04 ENCOUNTER — Ambulatory Visit (INDEPENDENT_AMBULATORY_CARE_PROVIDER_SITE_OTHER): Payer: 59 | Admitting: Family Medicine

## 2019-07-04 ENCOUNTER — Other Ambulatory Visit: Payer: Self-pay

## 2019-07-04 ENCOUNTER — Encounter: Payer: Self-pay | Admitting: Family Medicine

## 2019-07-04 VITALS — BP 126/81 | HR 61 | Temp 97.1°F | Wt 220.6 lb

## 2019-07-04 DIAGNOSIS — I10 Essential (primary) hypertension: Secondary | ICD-10-CM

## 2019-07-04 DIAGNOSIS — E7800 Pure hypercholesterolemia, unspecified: Secondary | ICD-10-CM | POA: Diagnosis not present

## 2019-07-04 DIAGNOSIS — R600 Localized edema: Secondary | ICD-10-CM

## 2019-07-11 LAB — COMPREHENSIVE METABOLIC PANEL
ALT: 19 IU/L (ref 0–32)
AST: 16 IU/L (ref 0–40)
Albumin/Globulin Ratio: 1.5 (ref 1.2–2.2)
Albumin: 4.1 g/dL (ref 3.8–4.8)
Alkaline Phosphatase: 61 IU/L (ref 48–121)
BUN/Creatinine Ratio: 19 (ref 12–28)
BUN: 15 mg/dL (ref 8–27)
Bilirubin Total: 0.3 mg/dL (ref 0.0–1.2)
CO2: 21 mmol/L (ref 20–29)
Calcium: 9.4 mg/dL (ref 8.7–10.3)
Chloride: 104 mmol/L (ref 96–106)
Creatinine, Ser: 0.77 mg/dL (ref 0.57–1.00)
GFR calc Af Amer: 91 mL/min/{1.73_m2} (ref >59)
GFR calc non Af Amer: 79 mL/min/{1.73_m2} (ref >59)
Globulin, Total: 2.7 g/dL (ref 1.5–4.5)
Glucose: 102 mg/dL — ABNORMAL HIGH (ref 65–99)
Potassium: 4.5 mmol/L (ref 3.5–5.2)
Sodium: 143 mmol/L (ref 134–144)
Total Protein: 6.8 g/dL (ref 6.0–8.5)

## 2019-07-11 LAB — LIPID PANEL
Chol/HDL Ratio: 3.4 ratio (ref 0.0–4.4)
Cholesterol, Total: 164 mg/dL (ref 100–199)
HDL: 48 mg/dL (ref >39)
LDL Chol Calc (NIH): 87 mg/dL (ref 0–99)
Triglycerides: 169 mg/dL — ABNORMAL HIGH (ref 0–149)
VLDL Cholesterol Cal: 29 mg/dL (ref 5–40)

## 2019-07-11 LAB — CBC WITH DIFFERENTIAL/PLATELET
Basophils Absolute: 0 10*3/uL (ref 0.0–0.2)
Basos: 1 %
EOS (ABSOLUTE): 0.1 10*3/uL (ref 0.0–0.4)
Eos: 2 %
Hematocrit: 37 % (ref 34.0–46.6)
Hemoglobin: 12.3 g/dL (ref 11.1–15.9)
Immature Grans (Abs): 0 10*3/uL (ref 0.0–0.1)
Immature Granulocytes: 0 %
Lymphocytes Absolute: 1.8 10*3/uL (ref 0.7–3.1)
Lymphs: 36 %
MCH: 29.8 pg (ref 26.6–33.0)
MCHC: 33.2 g/dL (ref 31.5–35.7)
MCV: 90 fL (ref 79–97)
Monocytes Absolute: 0.4 10*3/uL (ref 0.1–0.9)
Monocytes: 9 %
Neutrophils Absolute: 2.6 10*3/uL (ref 1.4–7.0)
Neutrophils: 52 %
Platelets: 280 10*3/uL (ref 150–450)
RBC: 4.13 x10E6/uL (ref 3.77–5.28)
RDW: 12.5 % (ref 11.7–15.4)
WBC: 5 10*3/uL (ref 3.4–10.8)

## 2019-07-11 LAB — TSH: TSH: 2.34 u[IU]/mL (ref 0.450–4.500)

## 2019-07-12 ENCOUNTER — Telehealth: Payer: Self-pay | Admitting: *Deleted

## 2019-07-12 NOTE — Telephone Encounter (Signed)
-----   Message from Jerrol Banana., MD sent at 07/11/2019  3:42 PM EDT ----- Labs all stable.  Continue to work on lifestyle habits of good diet and regular exercise

## 2019-07-12 NOTE — Telephone Encounter (Signed)
LMOVM on vm for pt to return call.

## 2019-07-17 NOTE — Telephone Encounter (Signed)
Called to advise patient, LVMTCB.  

## 2019-07-19 NOTE — Telephone Encounter (Signed)
Comments to Patient Seen by patient Sharon Small on 07/17/2019 12:10 PM EDT

## 2019-08-09 ENCOUNTER — Telehealth: Payer: Self-pay

## 2019-08-09 DIAGNOSIS — N390 Urinary tract infection, site not specified: Secondary | ICD-10-CM

## 2019-08-09 MED ORDER — CIPROFLOXACIN HCL 500 MG PO TABS: 500.0000 mg | ORAL_TABLET | Freq: Two times a day (BID) | ORAL | 0 refills | Status: DC

## 2019-08-09 NOTE — Telephone Encounter (Signed)
Refill for 5 days.

## 2019-08-09 NOTE — Telephone Encounter (Signed)
Medication sent to pharmacy for patient.  

## 2019-08-09 NOTE — Telephone Encounter (Signed)
Copied from [place] [id number]. Topic: General - Other >> Aug 08, 2019  5:11 PM Rainey Pines A wrote: Patient stated that she called earlier today but couldn't remember who she talked with. Patient is slightly upset because she called at 8:34 this morning and didn't get a callback  today from Dr. Marlan Palau nurse in regards to some flare ups that she has been having.

## 2019-08-09 NOTE — Telephone Encounter (Signed)
Copied from [place] [id number]. Topic: General - Other >> Aug 09, 2019 12:20 PM Hinda Lenis D wrote: Reason for CRM: PT has a UTI she wants to speak with a nurse ASP / please advise

## 2019-08-09 NOTE — Addendum Note (Signed)
Addended by: Gerald Stabs on: 08/09/2019 04:32 PM   Modules accepted: Orders

## 2019-08-09 NOTE — Telephone Encounter (Signed)
Spoke to patient about maybe going to the urgent care for her symptoms because all of our providers are booked up. She said that her symptoms have been going on for two weeks and that she called on a  Saturday on which we were closed to speak to a provider and was transferred to [facility] to speak to a provider about her symptoms and was given Cipro. She said she took the medication until it was completely gone. She said she called and spoke to someone yesterday at 8 am about everything that was going on and never got a call back, which I do not see any of the prior telephone encounters in her chart. She wants to know if the prescription that she took for 3 days was enough to cure her UTI. Please advise?

## 2019-08-09 NOTE — Telephone Encounter (Signed)
If symptoms are now resolved 3 days is my standard treatment for uncomplicated UTIs

## 2019-08-09 NOTE — Telephone Encounter (Signed)
Patient says that she is still feeling some pressure that she was feeling when her UTI first started and she is afraid that it will flare up again.

## 2019-08-10 NOTE — Telephone Encounter (Signed)
Spoke to patient via another phone call.

## 2019-08-27 ENCOUNTER — Other Ambulatory Visit: Payer: Self-pay | Admitting: Family Medicine

## 2019-08-27 DIAGNOSIS — H40003 Preglaucoma, unspecified, bilateral: Secondary | ICD-10-CM | POA: Diagnosis not present

## 2019-10-04 ENCOUNTER — Encounter: Payer: Self-pay | Admitting: Family Medicine

## 2019-11-22 ENCOUNTER — Other Ambulatory Visit: Payer: Self-pay | Admitting: Family Medicine

## 2019-12-20 ENCOUNTER — Encounter: Payer: Self-pay | Admitting: Family Medicine

## 2019-12-21 ENCOUNTER — Other Ambulatory Visit: Payer: Self-pay | Admitting: *Deleted

## 2019-12-21 MED ORDER — SIMVASTATIN 20 MG PO TABS: 20.0000 mg | ORAL_TABLET | Freq: Every day | ORAL | 3 refills | Status: DC

## 2019-12-28 ENCOUNTER — Other Ambulatory Visit: Payer: Self-pay | Admitting: Family Medicine

## 2019-12-28 DIAGNOSIS — Z1231 Encounter for screening mammogram for malignant neoplasm of breast: Secondary | ICD-10-CM

## 2020-01-03 ENCOUNTER — Encounter: Payer: Self-pay | Admitting: Family Medicine

## 2020-01-03 ENCOUNTER — Ambulatory Visit (INDEPENDENT_AMBULATORY_CARE_PROVIDER_SITE_OTHER): Payer: Medicare Other | Admitting: Family Medicine

## 2020-01-03 ENCOUNTER — Other Ambulatory Visit: Payer: Self-pay

## 2020-01-03 VITALS — BP 119/82 | HR 56 | Temp 98.2°F | Resp 16 | Ht 66.0 in | Wt 219.0 lb

## 2020-01-03 DIAGNOSIS — R5383 Other fatigue: Secondary | ICD-10-CM

## 2020-01-03 DIAGNOSIS — Z0000 Encounter for general adult medical examination without abnormal findings: Secondary | ICD-10-CM

## 2020-01-03 DIAGNOSIS — E66812 Obesity, class 2: Secondary | ICD-10-CM

## 2020-01-03 DIAGNOSIS — E7800 Pure hypercholesterolemia, unspecified: Secondary | ICD-10-CM

## 2020-01-03 DIAGNOSIS — R5381 Other malaise: Secondary | ICD-10-CM

## 2020-01-03 DIAGNOSIS — I10 Essential (primary) hypertension: Secondary | ICD-10-CM

## 2020-01-03 DIAGNOSIS — R7303 Prediabetes: Secondary | ICD-10-CM

## 2020-01-03 DIAGNOSIS — E538 Deficiency of other specified B group vitamins: Secondary | ICD-10-CM

## 2020-01-03 DIAGNOSIS — L80 Vitiligo: Secondary | ICD-10-CM

## 2020-01-03 DIAGNOSIS — Z6837 Body mass index (BMI) 37.0-37.9, adult: Secondary | ICD-10-CM

## 2020-01-03 NOTE — Progress Notes (Signed)
I,April Miller,acting as a scribe for Wilhemena Durie, MD.,have documented all relevant documentation on the behalf of Wilhemena Durie, MD,as directed by  Wilhemena Durie, MD while in the presence of Wilhemena Durie, MD.   Complete physical exam   Patient: Sharon Small   DOB: 01-08-1951   69 y.o. Female  MRN: [id number] Visit Date: 01/03/2020  Today's healthcare provider: Wilhemena Durie, MD   Chief Complaint  Patient presents with  . Annual Exam   Subjective    Sharon Small is a 69 y.o. female who presents today for a complete physical exam.  She reports consuming a general diet. The patient does not participate in regular exercise at present. She generally feels well. She reports sleeping fairly well. She does not have additional problems to discuss today.  HPI     Past Medical History:  Diagnosis Date  . GERD (gastroesophageal reflux disease)   . Hypertension   . Snores    Past Surgical History:  Procedure Laterality Date  . CATARACT EXTRACTION Bilateral 2003 & 2005  . COLONOSCOPY    . COLONOSCOPY WITH PROPOFOL N/A 07/04/2015   Procedure: COLONOSCOPY WITH PROPOFOL;  Surgeon: Lucilla Lame, MD;  Location: [facility];  Service: Endoscopy;  Laterality: N/A;  prefers early  . VAGINAL HYSTERECTOMY  2005   Bilateral oophorectomy   Social History   Socioeconomic History  . Marital status: Married    Spouse name: Not on file  . Number of children: Not on file  . Years of education: Not on file  . Highest education level: Not on file  Occupational History  . Not on file  Tobacco Use  . Smoking status: Never Smoker  . Smokeless tobacco: Never Used  Vaping Use  . Vaping Use: Never used  Substance and Sexual Activity  . Alcohol use: No  . Drug use: No  . Sexual activity: Yes  Other Topics Concern  . Not on file  Social History Narrative  . Not on file   Social Determinants of Health   Financial Resource Strain:   . Difficulty of  Paying Living Expenses: Not on file  Food Insecurity:   . Worried About Charity fundraiser in the Last Year: Not on file  . Ran Out of Food in the Last Year: Not on file  Transportation Needs:   . Lack of Transportation (Medical): Not on file  . Lack of Transportation (Non-Medical): Not on file  Physical Activity:   . Days of Exercise per Week: Not on file  . Minutes of Exercise per Session: Not on file  Stress:   . Feeling of Stress : Not on file  Social Connections:   . Frequency of Communication with Friends and Family: Not on file  . Frequency of Social Gatherings with Friends and Family: Not on file  . Attends Religious Services: Not on file  . Active Member of Clubs or Organizations: Not on file  . Attends Archivist Meetings: Not on file  . Marital Status: Not on file  Intimate Partner Violence:   . Fear of Current or Ex-Partner: Not on file  . Emotionally Abused: Not on file  . Physically Abused: Not on file  . Sexually Abused: Not on file   Family Status  Relation Name Status  . Mother  Deceased at age 49       due to COPD  . Father  Deceased at age 70  cause of death MI  . Sister 1 Alive  . Sister 2 Alive  . Daughter  Alive  . Son  Alive  . Daughter  Alive  . Mat Aunt  (Not Specified)   Family History  Problem Relation Age of Onset  . COPD Mother   . Peripheral Artery Disease Mother   . Osteoporosis Mother   . Heart disease Father        fatal MI age 58  . Hypertension Sister   . Glaucoma Sister   . Breast cancer Maternal Aunt 70   No Known Allergies  Patient Care Team: Jerrol Banana., MD as PCP - General (Family Medicine) Rockey Situ Kathlene November, MD as Consulting Physician (Cardiology)   Medications: Outpatient Medications Prior to Visit  Medication Sig  . aspirin 81 MG tablet Take 1 tablet by mouth daily.  . benazepril (LOTENSIN) 40 MG tablet TAKE 1 TABLET(40 MG) BY MOUTH DAILY. OVERDUE FOR APPT.  Marland Kitchen Biotin 10 MG CAPS Take by  mouth.  . bisoprolol-hydrochlorothiazide (ZIAC) 10-6.25 MG tablet TAKE 1 TABLET BY MOUTH DAILY  . Cholecalciferol (VITAMIN D) 2000 units CAPS Take by mouth daily.  Marland Kitchen FINACEA 15 % FOAM APPLY TO SKIN BID  . Magnesium 250 MG TABS Take by mouth.  . Misc Natural Products (GLUCOSAMINE CHONDROITIN COMPLX) TABS Take 1 tablet by mouth daily.  . Multiple Vitamin (MULTIVITAMIN) tablet Take 1 tablet by mouth daily.  Marland Kitchen omeprazole (PRILOSEC) 20 MG capsule Take 20 mg by mouth daily.  . simvastatin (ZOCOR) 20 MG tablet Take 1 tablet (20 mg total) by mouth at bedtime.  . ciprofloxacin (CIPRO) 500 MG tablet Take 1 tablet (500 mg total) by mouth 2 (two) times daily. (Patient not taking: Reported on 01/03/2020)  . Magnesium 65 MG TABS Take 1 tablet by mouth daily. (Patient not taking: Reported on 01/03/2020)  . naproxen (NAPROSYN) 500 MG tablet TAKE 1 TABLET(500 MG) BY MOUTH TWICE DAILY WITH A MEAL (Patient not taking: Reported on 01/03/2020)   No facility-administered medications prior to visit.    Review of Systems  Respiratory: Positive for shortness of breath.   Cardiovascular: Positive for palpitations.  Musculoskeletal: Positive for arthralgias.  All other systems reviewed and are negative.      Objective    BP (!) 103/102 (BP Location: Left Arm, Patient Position: Sitting, Cuff Size: Large)   Pulse (!) 56   Temp 98.2 F (36.8 C) (Oral)   Resp 16   Ht 5\' 6"  (1.676 m)   Wt 219 lb (99.3 kg)   SpO2 98%   BMI 35.35 kg/m  BP Readings from Last 3 Encounters:  01/03/20 119/82  07/04/19 126/81  11/28/18 133/75   Wt Readings from Last 3 Encounters:  01/03/20 219 lb (99.3 kg)  07/04/19 220 lb 9.6 oz (100.1 kg)  11/28/18 228 lb (103.4 kg)      Physical Exam Vitals reviewed.  Constitutional:      Appearance: She is well-developed.  HENT:     Head: Normocephalic and atraumatic.     Right Ear: External ear normal.     Left Ear: External ear normal.     Nose: Nose normal.  Eyes:      General: No scleral icterus.    Conjunctiva/sclera: Conjunctivae normal.  Neck:     Thyroid: No thyromegaly.  Cardiovascular:     Rate and Rhythm: Normal rate and regular rhythm.     Heart sounds: Normal heart sounds.  Pulmonary:     Effort: Pulmonary  effort is normal.     Breath sounds: Normal breath sounds.  Abdominal:     Palpations: Abdomen is soft.  Lymphadenopathy:     Cervical: No cervical adenopathy.  Skin:    General: Skin is warm and dry.  Neurological:     Mental Status: She is alert and oriented to person, place, and time.  Psychiatric:        Mood and Affect: Mood normal.        Behavior: Behavior normal.        Thought Content: Thought content normal.        Judgment: Judgment normal.     Declines breast exam, pelvic exam, rectal exam.  Last depression screening scores PHQ 2/9 Scores 01/03/2020 07/04/2019 03/13/2018  PHQ - 2 Score 0 0 0  PHQ- 9 Score 1 - -   Last fall risk screening Fall Risk  01/03/2020  Falls in the past year? 1  Number falls in past yr: 0  Injury with Fall? 0  Follow up Falls evaluation completed   Last Audit-C alcohol use screening Alcohol Use Disorder Test (AUDIT) 01/03/2020  1. How often do you have a drink containing alcohol? 0  2. How many drinks containing alcohol do you have on a typical day when you are drinking? 0  3. How often do you have six or more drinks on one occasion? 0  AUDIT-C Score 0  Alcohol Brief Interventions/Follow-up AUDIT Score <7 follow-up not indicated   A score of 3 or more in women, and 4 or more in men indicates increased risk for alcohol abuse, EXCEPT if all of the points are from question 1   No results found for any visits on 01/03/20.  Assessment & Plan    Routine Health Maintenance and Physical Exam  Exercise Activities and Dietary recommendations Goals   None     Immunization History  Administered Date(s) Administered  . Influenza, High Dose Seasonal PF 11/16/2016, 11/03/2017, 10/25/2018    . Influenza,inj,Quad PF,6+ Mos 11/17/2015  . PFIZER SARS-COV-2 Vaccination 04/09/2019, 04/30/2019  . Pneumococcal Conjugate-13 11/16/2016  . Pneumococcal Polysaccharide-23 01/05/2018  . Tdap 11/23/2006  . Zoster 07/28/2011  . Zoster Recombinat (Shingrix) 11/03/2017, 01/05/2018    Health Maintenance  Topic Date Due  . TETANUS/TDAP  11/22/2016  . INFLUENZA VACCINE  09/16/2019  . MAMMOGRAM  01/28/2021  . COLONOSCOPY  07/03/2025  . DEXA SCAN  Completed  . COVID-19 Vaccine  Completed  . Hepatitis C Screening  Completed  . PNA vac Low Risk Adult  Completed    Discussed health benefits of physical activity, and encouraged her to engage in regular exercise appropriate for her age and condition.  1. Annual physical exam   2. Essential (primary) hypertension  - CBC w/Diff/Platelet - Comprehensive Metabolic Panel (CMET)  3. Pure hypercholesterolemia  - Lipid panel - TSH - Comprehensive Metabolic Panel (CMET)  4. Borderline diabetes  - Hemoglobin A1c  5. Class 2 severe obesity with serious comorbidity and body mass index (BMI) of 37.0 to 37.9 in adult, unspecified obesity type ([place]) Diet exercise discussed. - CBC w/Diff/Platelet  6. Malaise and fatigue  - Vitamin B12 - VITAMIN D 25 Hydroxy (Vit-D Deficiency, Fractures) - Methylmalonic Acid  7. B12 deficiency  - Vitamin B12 - VITAMIN D 25 Hydroxy (Vit-D Deficiency, Fractures) - Methylmalonic Acid   No follow-ups on file.        Janielle Mittelstadt Cranford Mon, MD  [facility] [phone] (phone) [phone] (fax)  [place]

## 2020-01-08 DIAGNOSIS — E538 Deficiency of other specified B group vitamins: Secondary | ICD-10-CM | POA: Diagnosis not present

## 2020-01-08 DIAGNOSIS — R5383 Other fatigue: Secondary | ICD-10-CM | POA: Diagnosis not present

## 2020-01-08 DIAGNOSIS — R5381 Other malaise: Secondary | ICD-10-CM | POA: Diagnosis not present

## 2020-01-08 DIAGNOSIS — E7800 Pure hypercholesterolemia, unspecified: Secondary | ICD-10-CM | POA: Diagnosis not present

## 2020-01-08 DIAGNOSIS — I10 Essential (primary) hypertension: Secondary | ICD-10-CM | POA: Diagnosis not present

## 2020-01-08 DIAGNOSIS — R7303 Prediabetes: Secondary | ICD-10-CM | POA: Diagnosis not present

## 2020-01-09 LAB — LIPID PANEL
Chol/HDL Ratio: 3.7 ratio (ref 0.0–4.4)
Cholesterol, Total: 172 mg/dL (ref 100–199)
HDL: 47 mg/dL (ref >39)
LDL Chol Calc (NIH): 97 mg/dL (ref 0–99)
Triglycerides: 160 mg/dL — ABNORMAL HIGH (ref 0–149)
VLDL Cholesterol Cal: 28 mg/dL (ref 5–40)

## 2020-01-09 LAB — COMPREHENSIVE METABOLIC PANEL
ALT: 21 IU/L (ref 0–32)
AST: 19 IU/L (ref 0–40)
Albumin/Globulin Ratio: 1.9 (ref 1.2–2.2)
Albumin: 4.5 g/dL (ref 3.8–4.8)
Alkaline Phosphatase: 64 IU/L (ref 44–121)
BUN/Creatinine Ratio: 23 (ref 12–28)
BUN: 16 mg/dL (ref 8–27)
Bilirubin Total: 0.2 mg/dL (ref 0.0–1.2)
CO2: 24 mmol/L (ref 20–29)
Calcium: 9.5 mg/dL (ref 8.7–10.3)
Chloride: 102 mmol/L (ref 96–106)
Creatinine, Ser: 0.7 mg/dL (ref 0.57–1.00)
GFR calc Af Amer: 102 mL/min/{1.73_m2} (ref >59)
GFR calc non Af Amer: 88 mL/min/{1.73_m2} (ref >59)
Globulin, Total: 2.4 g/dL (ref 1.5–4.5)
Glucose: 102 mg/dL — ABNORMAL HIGH (ref 65–99)
Potassium: 4.3 mmol/L (ref 3.5–5.2)
Sodium: 142 mmol/L (ref 134–144)
Total Protein: 6.9 g/dL (ref 6.0–8.5)

## 2020-01-09 LAB — CBC WITH DIFFERENTIAL/PLATELET
Basophils Absolute: 0 10*3/uL (ref 0.0–0.2)
Basos: 1 %
EOS (ABSOLUTE): 0.1 10*3/uL (ref 0.0–0.4)
Eos: 2 %
Hematocrit: 38.1 % (ref 34.0–46.6)
Hemoglobin: 12.8 g/dL (ref 11.1–15.9)
Immature Grans (Abs): 0 10*3/uL (ref 0.0–0.1)
Immature Granulocytes: 0 %
Lymphocytes Absolute: 1.9 10*3/uL (ref 0.7–3.1)
Lymphs: 34 %
MCH: 30.3 pg (ref 26.6–33.0)
MCHC: 33.6 g/dL (ref 31.5–35.7)
MCV: 90 fL (ref 79–97)
Monocytes Absolute: 0.4 10*3/uL (ref 0.1–0.9)
Monocytes: 7 %
Neutrophils Absolute: 3.2 10*3/uL (ref 1.4–7.0)
Neutrophils: 56 %
Platelets: 279 10*3/uL (ref 150–450)
RBC: 4.23 x10E6/uL (ref 3.77–5.28)
RDW: 12.4 % (ref 11.7–15.4)
WBC: 5.6 10*3/uL (ref 3.4–10.8)

## 2020-01-09 LAB — TSH: TSH: 2.58 u[IU]/mL (ref 0.450–4.500)

## 2020-01-09 LAB — HEMOGLOBIN A1C
Est. average glucose Bld gHb Est-mCnc: 123 mg/dL
Hgb A1c MFr Bld: 5.9 % — ABNORMAL HIGH (ref 4.8–5.6)

## 2020-01-13 LAB — METHYLMALONIC ACID, SERUM: Methylmalonic Acid: 141 nmol/L (ref 0–378)

## 2020-01-13 LAB — VITAMIN B12: Vitamin B-12: 520 pg/mL (ref 232–1245)

## 2020-01-13 LAB — VITAMIN D 25 HYDROXY (VIT D DEFICIENCY, FRACTURES): Vit D, 25-Hydroxy: 40.8 ng/mL (ref 30.0–100.0)

## 2020-01-14 ENCOUNTER — Telehealth: Payer: Self-pay

## 2020-01-14 NOTE — Telephone Encounter (Signed)
LMOVM for pt to return call 

## 2020-01-14 NOTE — Telephone Encounter (Signed)
Copied from [place] [id number]. Topic: Appointment Scheduling - Scheduling Inquiry for Clinic >> Jan 14, 2020  8:52 AM Sharon Small wrote: Reason for CRM: Pt is calling and would like to schedule for covid booster. Pt also would like someone to know she does not like the new system she is use to calling directly to office

## 2020-01-16 ENCOUNTER — Telehealth: Payer: Self-pay

## 2020-01-16 NOTE — Telephone Encounter (Signed)
Left message advising pt.  (Per DPR)  Thanks,   -Babara Buffalo  

## 2020-01-16 NOTE — Telephone Encounter (Signed)
-----   Message from Jerrol Banana., MD sent at 01/14/2020  8:21 AM EST ----- Labs in normal range.

## 2020-01-18 NOTE — Telephone Encounter (Signed)
Patient has appt scheduled for 01/23/2020.

## 2020-01-23 ENCOUNTER — Ambulatory Visit: Payer: Medicare Other

## 2020-01-31 ENCOUNTER — Telehealth: Payer: Self-pay | Admitting: Cardiovascular Disease

## 2020-01-31 NOTE — Telephone Encounter (Signed)
3 attempts to schedule fu appt from recall list.   Deleting recall.   

## 2020-02-06 ENCOUNTER — Other Ambulatory Visit: Payer: Self-pay

## 2020-02-06 ENCOUNTER — Encounter: Payer: Self-pay | Admitting: Adult Health

## 2020-02-06 ENCOUNTER — Telehealth (INDEPENDENT_AMBULATORY_CARE_PROVIDER_SITE_OTHER): Payer: Medicare Other | Admitting: Adult Health

## 2020-02-06 DIAGNOSIS — J069 Acute upper respiratory infection, unspecified: Secondary | ICD-10-CM | POA: Insufficient documentation

## 2020-02-06 DIAGNOSIS — R059 Cough, unspecified: Secondary | ICD-10-CM | POA: Diagnosis not present

## 2020-02-06 DIAGNOSIS — J0140 Acute pansinusitis, unspecified: Secondary | ICD-10-CM | POA: Insufficient documentation

## 2020-02-06 DIAGNOSIS — J329 Chronic sinusitis, unspecified: Secondary | ICD-10-CM | POA: Diagnosis not present

## 2020-02-06 DIAGNOSIS — M79672 Pain in left foot: Secondary | ICD-10-CM

## 2020-02-06 MED ORDER — AMOXICILLIN-POT CLAVULANATE 875-125 MG PO TABS: 1.0000 | ORAL_TABLET | Freq: Two times a day (BID) | ORAL | 0 refills | Status: DC

## 2020-02-06 MED ORDER — PREDNISONE 10 MG (21) PO TBPK: ORAL_TABLET | ORAL | 0 refills | Status: DC

## 2020-02-06 MED ORDER — HYDROCOD POLST-CPM POLST ER 10-8 MG/5ML PO SUER: 5.0000 mL | Freq: Two times a day (BID) | ORAL | 0 refills | Status: DC | PRN

## 2020-02-06 NOTE — Progress Notes (Signed)
Virtual telephone visit    Virtual Visit via Telephone Note   This visit type was conducted due to national recommendations for restrictions regarding the COVID-19 Pandemic (e.g. social distancing) in an effort to limit this patient's exposure and mitigate transmission in our community. Due to her co-morbid illnesses, this patient is at least at moderate risk for complications without adequate follow up. This format is felt to be most appropriate for this patient at this time. The patient did not have access to video technology or had technical difficulties with video requiring transitioning to audio format only (telephone). Physical exam was limited to content and character of the telephone converstion.   Parties involved in visit as below:    Patient location: at home  Provider location: Provider: Provider's office at  [facility], [place].     I discussed the limitations of evaluation and management by telemedicine and the availability of in person appointments. The patient expressed understanding and agreed to proceed.   Visit Date: 02/06/2020  Today's healthcare provider: Marcille Buffy, FNP   Chief Complaint  Patient presents with  . Cough   Subjective    Cough This is a new problem. The current episode started 1 to 4 weeks ago (2 weeks. ). The problem has been unchanged. The cough is productive of sputum. Associated symptoms include myalgias and postnasal drip. Pertinent negatives include no chest pain, chills, ear congestion, ear pain, fever, headaches, heartburn, hemoptysis, nasal congestion, rash, rhinorrhea, sore throat (patient states that she has a fullness feeling but states that it is not sore), shortness of breath, sweats, weight loss or wheezing. Associated symptoms comments: Right ear itching . The symptoms are aggravated by lying down. Treatments tried: otc advil. The treatment provided mild relief.    She had a negative covid test.    Patient  denies any fever, body aches,chills, rash, chest pain, , nausea, vomiting, or diarrhea.   Denies dizziness, lightheadedness, pre syncopal or syncopal episodes.        Medications: Outpatient Medications Prior to Visit  Medication Sig  . aspirin 81 MG tablet Take 1 tablet by mouth daily.  . benazepril (LOTENSIN) 40 MG tablet TAKE 1 TABLET(40 MG) BY MOUTH DAILY. OVERDUE FOR APPT.  Marland Kitchen Biotin 10 MG CAPS Take by mouth.  . bisoprolol-hydrochlorothiazide (ZIAC) 10-6.25 MG tablet TAKE 1 TABLET BY MOUTH DAILY  . Cholecalciferol (VITAMIN D) 2000 units CAPS Take by mouth daily.  Marland Kitchen FINACEA 15 % FOAM APPLY TO SKIN BID  . Magnesium 250 MG TABS Take by mouth.  . Magnesium 65 MG TABS Take 1 tablet by mouth daily.  . Misc Natural Products (GLUCOSAMINE CHONDROITIN COMPLX) TABS Take 1 tablet by mouth daily.  . Multiple Vitamin (MULTIVITAMIN) tablet Take 1 tablet by mouth daily.  . naproxen (NAPROSYN) 500 MG tablet TAKE 1 TABLET(500 MG) BY MOUTH TWICE DAILY WITH A MEAL  . omeprazole (PRILOSEC) 20 MG capsule Take 20 mg by mouth daily.  . simvastatin (ZOCOR) 20 MG tablet Take 1 tablet (20 mg total) by mouth at bedtime.  . ciprofloxacin (CIPRO) 500 MG tablet Take 1 tablet (500 mg total) by mouth 2 (two) times daily. (Patient not taking: No sig reported)   No facility-administered medications prior to visit.    Review of Systems  Constitutional: Negative for chills, fever and weight loss.  HENT: Positive for postnasal drip. Negative for ear pain, rhinorrhea and sore throat (patient states that she has a fullness feeling but states that it  is not sore).   Respiratory: Positive for cough. Negative for hemoptysis, shortness of breath and wheezing.   Cardiovascular: Negative for chest pain.  Gastrointestinal: Negative for heartburn.  Musculoskeletal: Positive for myalgias.  Skin: Negative for rash.  Neurological: Negative for headaches.      Objective    There were no vitals taken for this  visit.    Patient is alert and oriented and responsive to questions Engages in conversation with provider. Speaks in full sentences without any pauses without any shortness of breath or distress.  No vital signs available.   Assessment & Plan     Acute non-recurrent pansinusitis  Upper respiratory tract infection, unspecified type  Sinusitis, unspecified chronicity, unspecified location - Plan: amoxicillin-clavulanate (AUGMENTIN) 875-125 MG tablet  Left foot pain  Cough   Meds ordered this encounter  Medications  . amoxicillin-clavulanate (AUGMENTIN) 875-125 MG tablet    Sig: Take 1 tablet by mouth 2 (two) times daily.    Dispense:  20 tablet    Refill:  0  . predniSONE (STERAPRED UNI-PAK 21 TAB) 10 MG (21) TBPK tablet    Sig: PO: Take 6 tablets on day 1:Take 5 tablets day 2:Take 4 tablets day 3: Take 3 tablets day 4:Take 2 tablets day five: 5 Take 1 tablet day 6    Dispense:  21 tablet    Refill:  0  . chlorpheniramine-HYDROcodone (TUSSIONEX PENNKINETIC ER) 10-8 MG/5ML SUER    Sig: Take 5 mLs by mouth every 12 (twelve) hours as needed for cough.    Dispense:  140 mL    Refill:  0    Return in about 3 days (around 02/09/2020), or if symptoms worsen or fail to improve, for at any time for any worsening symptoms, Go to Emergency room/ urgent care if worse.    I discussed the assessment and treatment plan with the patient. The patient was provided an opportunity to ask questions and all were answered. The patient agreed with the plan and demonstrated an understanding of the instructions.   The patient was advised to call back or seek an in-person evaluation if the symptoms worsen or if the condition fails to improve as anticipated.  I provided 30 minutes of non-face-to-face time during this encounter.  The entirety of the information documented in the History of Present Illness, Review of Systems and Physical Exam were personally obtained by me. Portions of this information  were initially documented by the CMA and reviewed by me for thoroughness and accuracy.     Marcille Buffy, [place] [phone] (phone) [phone] (fax)  [place]

## 2020-02-12 NOTE — Patient Instructions (Signed)
Sinusitis, Adult Sinusitis is soreness and swelling (inflammation) of your sinuses. Sinuses are hollow spaces in the bones around your face. They are located:  Around your eyes.  In the middle of your forehead.  Behind your nose.  In your cheekbones. Your sinuses and nasal passages are lined with a fluid called mucus. Mucus drains out of your sinuses. Swelling can trap mucus in your sinuses. This lets germs (bacteria, virus, or fungus) grow, which leads to infection. Most of the time, this condition is caused by a virus. What are the causes? This condition is caused by:  Allergies.  Asthma.  Germs.  Things that block your nose or sinuses.  Growths in the nose (nasal polyps).  Chemicals or irritants in the air.  Fungus (rare). What increases the risk? You are more likely to develop this condition if:  You have a weak body defense system (immune system).  You do a lot of swimming or diving.  You use nasal sprays too much.  You smoke. What are the signs or symptoms? The main symptoms of this condition are pain and a feeling of pressure around the sinuses. Other symptoms include:  Stuffy nose (congestion).  Runny nose (drainage).  Swelling and warmth in the sinuses.  Headache.  Toothache.  A cough that may get worse at night.  Mucus that collects in the throat or the back of the nose (postnasal drip).  Being unable to smell and taste.  Being very tired (fatigue).  A fever.  Sore throat.  Bad breath. How is this diagnosed? This condition is diagnosed based on:  Your symptoms.  Your medical history.  A physical exam.  Tests to find out if your condition is short-term (acute) or long-term (chronic). Your doctor may: ? Check your nose for growths (polyps). ? Check your sinuses using a tool that has a light (endoscope). ? Check for allergies or germs. ? Do imaging tests, such as an MRI or CT scan. How is this treated? Treatment for this condition  depends on the cause and whether it is short-term or long-term.  If caused by a virus, your symptoms should go away on their own within 10 days. You may be given medicines to relieve symptoms. They include: ? Medicines that shrink swollen tissue in the nose. ? Medicines that treat allergies (antihistamines). ? A spray that treats swelling of the nostrils. ? Rinses that help get rid of thick mucus in your nose (nasal saline washes).  If caused by bacteria, your doctor may wait to see if you will get better without treatment. You may be given antibiotic medicine if you have: ? A very bad infection. ? A weak body defense system.  If caused by growths in the nose, you may need to have surgery. Follow these instructions at home: Medicines  Take, use, or apply over-the-counter and prescription medicines only as told by your doctor. These may include nasal sprays.  If you were prescribed an antibiotic medicine, take it as told by your doctor. Do not stop taking the antibiotic even if you start to feel better. Hydrate and humidify   Drink enough water to keep your pee (urine) pale yellow.  Use a cool mist humidifier to keep the humidity level in your home above 50%.  Breathe in steam for 10-15 minutes, 3-4 times a day, or as told by your doctor. You can do this in the bathroom while a hot shower is running.  Try not to spend time in cool or dry air.   Rest  Rest as much as you can.  Sleep with your head raised (elevated).  Make sure you get enough sleep each night. General instructions   Put a warm, moist washcloth on your face 3-4 times a day, or as often as told by your doctor. This will help with discomfort.  Wash your hands often with soap and water. If there is no soap and water, use hand sanitizer.  Do not smoke. Avoid being around people who are smoking (secondhand smoke).  Keep all follow-up visits as told by your doctor. This is important. Contact a doctor if:  You  have a fever.  Your symptoms get worse.  Your symptoms do not get better within 10 days. Get help right away if:  You have a very bad headache.  You cannot stop throwing up (vomiting).  You have very bad pain or swelling around your face or eyes.  You have trouble seeing.  You feel confused.  Your neck is stiff.  You have trouble breathing. Summary  Sinusitis is swelling of your sinuses. Sinuses are hollow spaces in the bones around your face.  This condition is caused by tissues in your nose that become inflamed or swollen. This traps germs. These can lead to infection.  If you were prescribed an antibiotic medicine, take it as told by your doctor. Do not stop taking it even if you start to feel better.  Keep all follow-up visits as told by your doctor. This is important. This information is not intended to replace advice given to you by your health care provider. Make sure you discuss any questions you have with your health care provider. Document Revised: 07/04/2017 Document Reviewed: 07/04/2017 Elsevier Patient Education  2020 [employer] Inc[employer] Cough, Adult A cough helps to clear your throat and lungs. A cough may be a sign of an illness or another medical condition. An acute cough may only last 2-3 weeks, while a chronic cough may last 8 or more weeks. Many things can cause a cough. They include:  Germs (viruses or bacteria) that attack the airway.  Breathing in things that bother (irritate) your lungs.  Allergies.  Asthma.  Mucus that runs down the back of your throat (postnasal drip).  Smoking.  Acid backing up from the stomach into the tube that moves food from the mouth to the stomach (gastroesophageal reflux).  Some medicines.  Lung problems.  Other medical conditions, such as heart failure or a blood clot in the lung (pulmonary embolism). Follow these instructions at home: Medicines  Take over-the-counter and prescription medicines only as told by  your doctor.  Talk with your doctor before you take medicines that stop a cough (coughsuppressants). Lifestyle   Do not smoke, and try not to be around smoke. Do not use any products that contain nicotine or tobacco, such as cigarettes, e-cigarettes, and chewing tobacco. If you need help quitting, ask your doctor.  Drink enough fluid to keep your pee (urine) pale yellow.  Avoid caffeine.  Do not drink alcohol if your doctor tells you not to drink. General instructions   Watch for any changes in your cough. Tell your doctor about them.  Always cover your mouth when you cough.  Stay away from things that make you cough, such as perfume, candles, campfire smoke, or cleaning products.  If the air is dry, use a cool mist vaporizer or humidifier in your home.  If your cough is worse at night, try using extra pillows to raise your head up  higher while you sleep.  Rest as needed.  Keep all follow-up visits as told by your doctor. This is important. Contact a doctor if:  You have new symptoms.  You cough up pus.  Your cough does not get better after 2-3 weeks, or your cough gets worse.  Cough medicine does not help your cough and you are not sleeping well.  You have pain that gets worse or pain that is not helped with medicine.  You have a fever.  You are losing weight and you do not know why.  You have night sweats. Get help right away if:  You cough up blood.  You have trouble breathing.  Your heartbeat is very fast. These symptoms may be an emergency. Do not wait to see if the symptoms will go away. Get medical help right away. Call your local emergency services (911 in the [place] Do not drive yourself to the hospital. Summary  A cough helps to clear your throat and lungs. Many things can cause a cough.  Take over-the-counter and prescription medicines only as told by your doctor.  Always cover your mouth when you cough.  Contact a doctor if you have new  symptoms or you have a cough that does not get better or gets worse. This information is not intended to replace advice given to you by your health care provider. Make sure you discuss any questions you have with your health care provider. Document Revised: 02/20/2018 Document Reviewed: 02/20/2018 Elsevier Patient Education  2020 [employer].

## 2020-02-20 ENCOUNTER — Other Ambulatory Visit: Payer: Self-pay | Admitting: Family Medicine

## 2020-02-25 DIAGNOSIS — H40003 Preglaucoma, unspecified, bilateral: Secondary | ICD-10-CM | POA: Diagnosis not present

## 2020-03-05 ENCOUNTER — Other Ambulatory Visit: Payer: Self-pay

## 2020-03-05 ENCOUNTER — Ambulatory Visit
Admission: RE | Admit: 2020-03-05 | Discharge: 2020-03-05 | Disposition: A | Discharge status: Home / Self Care | Payer: Medicare Other | Source: Ambulatory Visit | Attending: Family Medicine | Admitting: Family Medicine

## 2020-03-05 DIAGNOSIS — Z1231 Encounter for screening mammogram for malignant neoplasm of breast: Secondary | ICD-10-CM | POA: Insufficient documentation

## 2020-03-17 ENCOUNTER — Ambulatory Visit: Payer: Medicare Other | Admitting: Dermatology

## 2020-03-17 ENCOUNTER — Other Ambulatory Visit: Payer: Self-pay

## 2020-03-17 DIAGNOSIS — H01134 Eczematous dermatitis of left upper eyelid: Secondary | ICD-10-CM | POA: Diagnosis not present

## 2020-03-17 DIAGNOSIS — L814 Other melanin hyperpigmentation: Secondary | ICD-10-CM | POA: Diagnosis not present

## 2020-03-17 DIAGNOSIS — L820 Inflamed seborrheic keratosis: Secondary | ICD-10-CM | POA: Diagnosis not present

## 2020-03-17 DIAGNOSIS — L578 Other skin changes due to chronic exposure to nonionizing radiation: Secondary | ICD-10-CM

## 2020-03-17 DIAGNOSIS — L918 Other hypertrophic disorders of the skin: Secondary | ICD-10-CM

## 2020-03-17 DIAGNOSIS — H01131 Eczematous dermatitis of right upper eyelid: Secondary | ICD-10-CM

## 2020-03-17 DIAGNOSIS — I781 Nevus, non-neoplastic: Secondary | ICD-10-CM | POA: Diagnosis not present

## 2020-03-17 DIAGNOSIS — L719 Rosacea, unspecified: Secondary | ICD-10-CM

## 2020-03-17 DIAGNOSIS — Z1283 Encounter for screening for malignant neoplasm of skin: Secondary | ICD-10-CM | POA: Diagnosis not present

## 2020-03-17 DIAGNOSIS — L853 Xerosis cutis: Secondary | ICD-10-CM

## 2020-03-17 DIAGNOSIS — D2372 Other benign neoplasm of skin of left lower limb, including hip: Secondary | ICD-10-CM | POA: Diagnosis not present

## 2020-03-17 DIAGNOSIS — L821 Other seborrheic keratosis: Secondary | ICD-10-CM

## 2020-03-17 DIAGNOSIS — D1800 Hemangioma unspecified site: Secondary | ICD-10-CM

## 2020-03-17 DIAGNOSIS — L738 Other specified follicular disorders: Secondary | ICD-10-CM

## 2020-03-17 DIAGNOSIS — D2361 Other benign neoplasm of skin of right upper limb, including shoulder: Secondary | ICD-10-CM | POA: Diagnosis not present

## 2020-03-17 DIAGNOSIS — D229 Melanocytic nevi, unspecified: Secondary | ICD-10-CM

## 2020-03-17 NOTE — Patient Instructions (Addendum)
Prior to procedure, discussed risks of blister formation, small wound, skin dyspigmentation, or rare scar following cryotherapy.     Start Eucrissa ointment use small amount on eyelids daily as needed for up to 2 weeks or until clear. Samples given in clinic today

## 2020-03-17 NOTE — Progress Notes (Signed)
Follow-Up Visit   Subjective  Sharon Small is a 70 y.o. female who presents for the following: tbse (Patient here today for tbse. She has history of rosacea. ).  Which is controlled with Finacea gel  Patient would like eyelids checked. She states for the past 10 days they have been red, itchy and dry. This has happened in the past. She would like to know if there is some cream she can use on area. She would also like place checked between the breasts that bothers her, she had it removed several years ago but it has come back. Patient here for full body skin exam and skin cancer screening.  She has h/o Eczema as a child.   The following portions of the chart were reviewed this encounter and updated as appropriate:        Objective  Well appearing patient in no apparent distress; mood and affect are within normal limits.  A full examination was performed including scalp, head, eyes, ears, nose, lips, neck, chest, axillae, abdomen, back, buttocks, bilateral upper extremities, bilateral lower extremities, hands, feet, fingers, toes, fingernails, and toenails. All findings within normal limits unless otherwise noted below.  Objective  midface: Clear today, make-up on  Objective  left medial breast x 3 (3): Erythematous keratotic or waxy stuck-on papule or plaque.   Objective  Head - Anterior (Face): Small yellow papules with a central dell.   Objective  neck and bilateral inframammary: Brown pedunculated papules - neck  Objective  bilateral legs: Small blue veins  Objective  BL Upper Eyelids: Edematous erythematous scaly patches bilateral upper eyelids   Assessment & Plan  Rosacea midface  Controlled Azelaic acid 15 % gel qd/bid prn  Rosacea is a chronic progressive skin condition usually affecting the face of adults, causing redness and/or acne bumps. It is treatable but not curable. It sometimes affects the eyes (ocular rosacea) as well. It may respond to  topical and/or systemic medication and can flare with stress, sun exposure, alcohol, exercise and some foods.  Daily application of broad spectrum spf 30+ sunscreen to face is recommended to reduce flares.      Inflamed seborrheic keratosis (3) left medial breast x 3  Recurrent   Prior to procedure, discussed risks of blister formation, small wound, skin dyspigmentation, or rare scar following cryotherapy.    Destruction of lesion - left medial breast x 3  Destruction method: cryotherapy   Informed consent: discussed and consent obtained   Lesion destroyed using liquid nitrogen: Yes   Region frozen until ice ball extended beyond lesion: Yes   Outcome: patient tolerated procedure well with no complications   Post-procedure details: wound care instructions given    Sebaceous hyperplasia of face Head - Anterior (Face)  Acrochordon neck and bilateral inframammary  Benign, observe.  Discussed snip excision if bothersome   Telangiectasia bilateral legs  Benign-appearing.  Observation.   Eczematous dermatitis of upper eyelids of both eyes BL Upper Eyelids  Atopic vrs contact- currently flared Start Eucrisa ointment use small amount on eyelids daily as needed for up to 2 weeks or until clear- Samples given in clinic today   Avoid using eye makeup until rash cleared. Gradually add makeup back 1 product at a time.   Also can use Vaseline on area prn   Lentigines - Scattered tan macules - Discussed due to sun exposure - Benign, observe - Call for any changes  Seborrheic Keratoses Left posterior shoulder, back, left antecubitum, left temporal scalp  -  Stuck-on, waxy, tan-brown papules and plaques  - Discussed benign etiology and prognosis. - Observe - Call for any changes  Melanocytic Nevi - Tan-brown and/or pink-flesh-colored symmetric macules and papules - Benign appearing on exam today - Observation - Call clinic for new or changing moles - Recommend daily use of  broad spectrum spf 30+ sunscreen to sun-exposed areas.   Hemangiomas - Red papules - Discussed benign nature - Observe - Call for any changes  Dermatofibroma Right upper arm, left lateral ankle  0.28mm  - Firm pink/brown papulenodule with dimple sign - Benign appearing - Call for any changes  Xerosis Arms and legs  - diffuse xerotic patches - recommend gentle, hydrating skin care - gentle skin care handout given  Actinic Damage - Chronic, secondary to cumulative UV/sun exposure - diffuse scaly erythematous macules with underlying dyspigmentation - Recommend daily broad spectrum sunscreen SPF 30+ to sun-exposed areas, reapply every 2 hours as needed.  - Call for new or changing lesions.  Skin cancer screening performed today.  Return in about 1 year (around 03/17/2021) for tbse, RTC sooner if eyelid dermatitis doesn't improve .   I, Ruthell Rummage, CMA, am acting as scribe for Brendolyn Patty, MD.  Documentation: I have reviewed the above documentation for accuracy and completeness, and I agree with the above.  Brendolyn Patty MD

## 2020-04-09 DIAGNOSIS — H40003 Preglaucoma, unspecified, bilateral: Secondary | ICD-10-CM | POA: Diagnosis not present

## 2020-05-20 ENCOUNTER — Other Ambulatory Visit: Payer: Self-pay | Admitting: Family Medicine

## 2020-07-02 ENCOUNTER — Ambulatory Visit: Payer: Self-pay | Admitting: Family Medicine

## 2020-08-13 ENCOUNTER — Ambulatory Visit: Payer: Self-pay | Admitting: Family Medicine

## 2020-08-21 ENCOUNTER — Ambulatory Visit (INDEPENDENT_AMBULATORY_CARE_PROVIDER_SITE_OTHER): Payer: Medicare Other | Admitting: Family Medicine

## 2020-08-21 ENCOUNTER — Encounter: Payer: Self-pay | Admitting: Family Medicine

## 2020-08-21 ENCOUNTER — Other Ambulatory Visit: Payer: Self-pay

## 2020-08-21 VITALS — BP 145/79 | HR 71 | Temp 98.3°F | Resp 16 | Wt 217.0 lb

## 2020-08-21 DIAGNOSIS — E7800 Pure hypercholesterolemia, unspecified: Secondary | ICD-10-CM

## 2020-08-21 DIAGNOSIS — K219 Gastro-esophageal reflux disease without esophagitis: Secondary | ICD-10-CM | POA: Diagnosis not present

## 2020-08-21 DIAGNOSIS — M1990 Unspecified osteoarthritis, unspecified site: Secondary | ICD-10-CM

## 2020-08-21 DIAGNOSIS — L80 Vitiligo: Secondary | ICD-10-CM

## 2020-08-21 DIAGNOSIS — R682 Dry mouth, unspecified: Secondary | ICD-10-CM | POA: Diagnosis not present

## 2020-08-21 DIAGNOSIS — R7303 Prediabetes: Secondary | ICD-10-CM

## 2020-08-21 DIAGNOSIS — I10 Essential (primary) hypertension: Secondary | ICD-10-CM

## 2020-08-21 NOTE — Patient Instructions (Signed)
Continue to check BP readings at home.

## 2020-08-21 NOTE — Progress Notes (Signed)
Established patient visit   Patient: Sharon Small   DOB: 1950-07-17   70 y.o. Female  MRN: [id number] Visit Date: 08/21/2020  Today's healthcare provider: Wilhemena Durie, MD   Chief Complaint  Patient presents with   Hypertension    Subjective    HPI  Patient is now retired from [employer].  In the past year she has developed some vitiligo and has some mild arthralgias and a dry mouth.  She is not checking her blood pressures at home. She wishes to get labs done as she gets some free for the next few months through [facility], follow-up  BP Readings from Last 3 Encounters:  08/21/20 (!) 145/79  01/03/20 119/82  07/04/19 126/81   Wt Readings from Last 3 Encounters:  08/21/20 217 lb (98.4 kg)  01/03/20 219 lb (99.3 kg)  07/04/19 220 lb 9.6 oz (100.1 kg)     She was last seen for hypertension 8 months ago.  BP at that visit was 119/82. Management since that visit includes no medication changes.  She reports good compliance with treatment. She is not having side effects.  She is following a Regular diet. She is not exercising. She does not smoke.  Use of agents associated with hypertension: .   Outside blood pressures are checked occasionally. Symptoms: No chest pain No chest pressure  No palpitations No syncope  No dyspnea No orthopnea  No paroxysmal nocturnal dyspnea No lower extremity edema   Pertinent labs: Lab Results  Component Value Date   CHOL 172 01/08/2020   HDL 47 01/08/2020   LDLCALC 97 01/08/2020   TRIG 160 (H) 01/08/2020   CHOLHDL 3.7 01/08/2020   Lab Results  Component Value Date   NA 142 01/08/2020   K 4.3 01/08/2020   CREATININE 0.70 01/08/2020   GFRNONAA 88 01/08/2020   GFRAA 102 01/08/2020   GLUCOSE 102 (H) 01/08/2020     The 10-year ASCVD risk score Mikey Bussing DC Jr., et al., 2013) is: 15.7%        Medications: Outpatient Medications Prior to Visit  Medication Sig   aspirin 81 MG tablet Take 1 tablet by mouth  daily.   Azelaic Acid 15 % cream Apply 1 application topically in the morning and at bedtime.   benazepril (LOTENSIN) 40 MG tablet TAKE 1 TABLET(40 MG) BY MOUTH DAILY   Biotin 10 MG CAPS Take by mouth.   bisoprolol-hydrochlorothiazide (ZIAC) 10-6.25 MG tablet TAKE 1 TABLET BY MOUTH DAILY   chlorpheniramine-HYDROcodone (TUSSIONEX PENNKINETIC ER) 10-8 MG/5ML SUER Take 5 mLs by mouth every 12 (twelve) hours as needed for cough.   Cholecalciferol (VITAMIN D) 2000 units CAPS Take by mouth daily.   Magnesium 250 MG TABS Take by mouth.   Misc Natural Products (GLUCOSAMINE CHONDROITIN COMPLX) TABS Take 1 tablet by mouth daily.   Multiple Vitamin (MULTIVITAMIN) tablet Take 1 tablet by mouth daily.   naproxen (NAPROSYN) 500 MG tablet TAKE 1 TABLET(500 MG) BY MOUTH TWICE DAILY WITH A MEAL   omeprazole (PRILOSEC) 20 MG capsule Take 20 mg by mouth daily.   simvastatin (ZOCOR) 20 MG tablet Take 1 tablet (20 mg total) by mouth at bedtime.   Magnesium 65 MG TABS Take 1 tablet by mouth daily.   No facility-administered medications prior to visit.    Review of Systems      Objective    BP (!) 145/79   Pulse 71   Temp 98.3 F (36.8 C)   Resp 16  Wt 217 lb (98.4 kg)   BMI 35.02 kg/m  BP Readings from Last 3 Encounters:  08/21/20 (!) 145/79  01/03/20 119/82  07/04/19 126/81   Wt Readings from Last 3 Encounters:  08/21/20 217 lb (98.4 kg)  01/03/20 219 lb (99.3 kg)  07/04/19 220 lb 9.6 oz (100.1 kg)       Physical Exam Vitals reviewed.  HENT:     Head: Normocephalic and atraumatic.     Right Ear: External ear normal.     Left Ear: External ear normal.     Nose: Nose normal.     Mouth/Throat:     Pharynx: Oropharynx is clear.  Eyes:     General: No scleral icterus.    Conjunctiva/sclera: Conjunctivae normal.  Cardiovascular:     Rate and Rhythm: Normal rate and regular rhythm.     Pulses: Normal pulses.     Heart sounds: Normal heart sounds.  Pulmonary:     Effort: Pulmonary  effort is normal.     Breath sounds: Normal breath sounds.  Abdominal:     Palpations: Abdomen is soft.  Musculoskeletal:     Right lower leg: No edema.     Left lower leg: No edema.  Skin:    General: Skin is warm and dry.     Comments: Mild vitiligo noted  Neurological:     General: No focal deficit present.     Mental Status: She is alert and oriented to person, place, and time. Mental status is at baseline.  Psychiatric:        Mood and Affect: Mood normal.        Behavior: Behavior normal.        Thought Content: Thought content normal.        Judgment: Judgment normal.      No results found for any visits on 08/21/20.  Assessment & Plan     1. Essential (primary) hypertension Fair control.  Check blood pressures at home and bring in readings along with cuff.  Patient on Lotensin and Ziac - Comprehensive metabolic panel  2. Pure hypercholesterolemia Follow-up lipid on simvastatin - TSH - Lipid panel  3. Borderline diabetes Follow-up A1c and treat up - Hemoglobin A1c  4. Vitiligo Per - B12 and Folate Panel  5. Gastroesophageal reflux disease without esophagitis Patient states she needs daily omeprazole - CBC with Differential/Platelet - Sedimentation rate  6. Dry mouth   7. Arthritis  - Sedimentation rate - C-reactive protein   No follow-ups on file.      I, Wilhemena Durie, MD, have reviewed all documentation for this visit. The documentation on 08/27/20 for the exam, diagnosis, procedures, and orders are all accurate and complete.    Peirce Deveney Cranford Mon, MD  [facility] [phone] (phone) [phone] (fax)  [place]

## 2020-08-29 ENCOUNTER — Observation Stay
Admission: EM | Admit: 2020-08-29 | Discharge: 2020-08-30 | Disposition: A | Discharge status: Home / Self Care | Payer: Medicare Other | Attending: Internal Medicine | Admitting: Internal Medicine

## 2020-08-29 ENCOUNTER — Other Ambulatory Visit: Payer: Self-pay

## 2020-08-29 ENCOUNTER — Encounter: Payer: Self-pay | Admitting: Emergency Medicine

## 2020-08-29 ENCOUNTER — Emergency Department: Payer: Medicare Other

## 2020-08-29 DIAGNOSIS — R29898 Other symptoms and signs involving the musculoskeletal system: Secondary | ICD-10-CM

## 2020-08-29 DIAGNOSIS — R5381 Other malaise: Secondary | ICD-10-CM | POA: Diagnosis present

## 2020-08-29 DIAGNOSIS — I10 Essential (primary) hypertension: Secondary | ICD-10-CM

## 2020-08-29 DIAGNOSIS — J341 Cyst and mucocele of nose and nasal sinus: Secondary | ICD-10-CM | POA: Diagnosis not present

## 2020-08-29 DIAGNOSIS — K219 Gastro-esophageal reflux disease without esophagitis: Secondary | ICD-10-CM | POA: Diagnosis present

## 2020-08-29 DIAGNOSIS — M1990 Unspecified osteoarthritis, unspecified site: Secondary | ICD-10-CM | POA: Diagnosis present

## 2020-08-29 DIAGNOSIS — E785 Hyperlipidemia, unspecified: Secondary | ICD-10-CM | POA: Diagnosis present

## 2020-08-29 DIAGNOSIS — M79604 Pain in right leg: Secondary | ICD-10-CM | POA: Insufficient documentation

## 2020-08-29 DIAGNOSIS — G459 Transient cerebral ischemic attack, unspecified: Secondary | ICD-10-CM | POA: Diagnosis not present

## 2020-08-29 DIAGNOSIS — Z20822 Contact with and (suspected) exposure to covid-19: Secondary | ICD-10-CM | POA: Diagnosis not present

## 2020-08-29 DIAGNOSIS — Z7982 Long term (current) use of aspirin: Secondary | ICD-10-CM | POA: Diagnosis not present

## 2020-08-29 DIAGNOSIS — R531 Weakness: Secondary | ICD-10-CM | POA: Diagnosis not present

## 2020-08-29 DIAGNOSIS — R7303 Prediabetes: Secondary | ICD-10-CM | POA: Insufficient documentation

## 2020-08-29 DIAGNOSIS — G936 Cerebral edema: Secondary | ICD-10-CM | POA: Diagnosis not present

## 2020-08-29 DIAGNOSIS — I63322 Cerebral infarction due to thrombosis of left anterior cerebral artery: Principal | ICD-10-CM | POA: Insufficient documentation

## 2020-08-29 DIAGNOSIS — Z79899 Other long term (current) drug therapy: Secondary | ICD-10-CM | POA: Insufficient documentation

## 2020-08-29 DIAGNOSIS — I63522 Cerebral infarction due to unspecified occlusion or stenosis of left anterior cerebral artery: Secondary | ICD-10-CM | POA: Diagnosis present

## 2020-08-29 DIAGNOSIS — R001 Bradycardia, unspecified: Secondary | ICD-10-CM | POA: Diagnosis not present

## 2020-08-29 DIAGNOSIS — I6522 Occlusion and stenosis of left carotid artery: Secondary | ICD-10-CM | POA: Diagnosis not present

## 2020-08-29 DIAGNOSIS — I639 Cerebral infarction, unspecified: Secondary | ICD-10-CM | POA: Diagnosis present

## 2020-08-29 LAB — BASIC METABOLIC PANEL
Anion gap: 6 (ref 5–15)
BUN: 20 mg/dL (ref 8–23)
CO2: 30 mmol/L (ref 22–32)
Calcium: 9.3 mg/dL (ref 8.9–10.3)
Chloride: 104 mmol/L (ref 98–111)
Creatinine, Ser: 0.74 mg/dL (ref 0.44–1.00)
GFR, Estimated: >60 mL/min (ref >60)
Glucose, Bld: 100 mg/dL — ABNORMAL HIGH (ref 70–99)
Potassium: 4.1 mmol/L (ref 3.5–5.1)
Sodium: 140 mmol/L (ref 135–145)

## 2020-08-29 LAB — CBC WITH DIFFERENTIAL/PLATELET
Abs Immature Granulocytes: 0.03 10*3/uL (ref 0.00–0.07)
Basophils Absolute: 0 10*3/uL (ref 0.0–0.1)
Basophils Relative: 0 %
Eosinophils Absolute: 0.1 10*3/uL (ref 0.0–0.5)
Eosinophils Relative: 1 %
HCT: 38.9 % (ref 36.0–46.0)
Hemoglobin: 13.1 g/dL (ref 12.0–15.0)
Immature Granulocytes: 0 %
Lymphocytes Relative: 18 %
Lymphs Abs: 1.4 10*3/uL (ref 0.7–4.0)
MCH: 30.6 pg (ref 26.0–34.0)
MCHC: 33.7 g/dL (ref 30.0–36.0)
MCV: 90.9 fL (ref 80.0–100.0)
Monocytes Absolute: 0.5 10*3/uL (ref 0.1–1.0)
Monocytes Relative: 7 %
Neutro Abs: 5.5 10*3/uL (ref 1.7–7.7)
Neutrophils Relative %: 74 %
Platelets: 280 10*3/uL (ref 150–400)
RBC: 4.28 MIL/uL (ref 3.87–5.11)
RDW: 12.8 % (ref 11.5–15.5)
WBC: 7.5 10*3/uL (ref 4.0–10.5)
nRBC: 0 % (ref 0.0–0.2)

## 2020-08-29 LAB — TROPONIN I (HIGH SENSITIVITY)
Troponin I (High Sensitivity): 3 ng/L (ref <18)
Troponin I (High Sensitivity): 5 ng/L (ref <18)

## 2020-08-29 LAB — VITAMIN B12: Vitamin B-12: 406 pg/mL (ref 180–914)

## 2020-08-29 LAB — TSH: TSH: 2.81 u[IU]/mL (ref 0.350–4.500)

## 2020-08-29 MED ORDER — ACETAMINOPHEN 650 MG RE SUPP: 650.0000 mg | RECTAL | Status: DC | PRN

## 2020-08-29 MED ORDER — ACETAMINOPHEN 325 MG PO TABS: 650.0000 mg | ORAL_TABLET | ORAL | Status: DC | PRN

## 2020-08-29 MED ORDER — ATORVASTATIN CALCIUM 20 MG PO TABS
40.0000 mg | ORAL_TABLET | Freq: Every day | ORAL | Status: DC
  Administered 2020-08-29: 40 mg via ORAL
  Filled 2020-08-29: qty 2

## 2020-08-29 MED ORDER — STROKE: EARLY STAGES OF RECOVERY BOOK: Freq: Once | Status: DC

## 2020-08-29 MED ORDER — ASPIRIN EC 81 MG PO TBEC
81.0000 mg | DELAYED_RELEASE_TABLET | Freq: Every day | ORAL | Status: DC
  Administered 2020-08-30: 81 mg via ORAL
  Filled 2020-08-29: qty 1

## 2020-08-29 MED ORDER — PANTOPRAZOLE SODIUM 40 MG PO TBEC
40.0000 mg | DELAYED_RELEASE_TABLET | Freq: Every day | ORAL | Status: DC
  Administered 2020-08-30: 40 mg via ORAL
  Filled 2020-08-29: qty 1

## 2020-08-29 MED ORDER — LORAZEPAM 1 MG PO TABS
1.0000 mg | ORAL_TABLET | Freq: Once | ORAL | Status: AC | PRN
  Administered 2020-08-29: 1 mg via ORAL
  Filled 2020-08-29: qty 1

## 2020-08-29 MED ORDER — ACETAMINOPHEN 160 MG/5ML PO SOLN
650.0000 mg | ORAL | Status: DC | PRN
  Filled 2020-08-29: qty 20.3

## 2020-08-29 MED ORDER — HYDRALAZINE HCL 20 MG/ML IJ SOLN: 5.0000 mg | INTRAMUSCULAR | Status: DC | PRN

## 2020-08-29 MED ORDER — IOHEXOL 350 MG/ML SOLN
100.0000 mL | Freq: Once | INTRAVENOUS | Status: AC | PRN
  Administered 2020-08-29: 100 mL via INTRAVENOUS

## 2020-08-29 NOTE — Plan of Care (Signed)
Called by Dr. Harrell Gave  regarding stroke in this patient. Recommended CTA No emergent LVO. No perfusion deficit. Can be admitted at [facility].  Will be seen in formal consultation tomorrow. In the interim, needs 2D echo, A1c, lipid panel, frequent neurochecks. Aspirin 325 Further recommendations on formal consultation tomorrow.  -- Amie Portland, MD Neurologist [facility] Pager: [phone]

## 2020-08-29 NOTE — ED Provider Notes (Signed)
Sharon Small  ____________________________________________  Time seen: Approximately 2:49 PM  I have reviewed the triage vital signs and the nursing notes.   HISTORY  Chief Complaint Extremity Weakness    HPI KALESHA IRVING is a 70 y.o. female with a history of hypertension and GERD comes ED complaining of right leg weakness and feeling off balance that she first noticed on waking up this morning.  Was normal at bedtime last night.  Denies any head injury or headache or vision changes.  No upper extremity weakness or paresthesia.  She thinks that maybe her knee is "giving out" due to injury.  Symptoms are constant, no aggravating or alleviating factors.  She has fallen on the ground this morning due to balance trouble.  Took 325 of aspirin this morning.  Patient also notes that she does not do much activity normally, in the last 2 days she has been sitting on the couch all day binge watching [employer] TV series.   Past Medical History:  Diagnosis Date   GERD (gastroesophageal reflux disease)    Hypertension    Snores      Patient Active Problem List   Diagnosis Date Noted   Upper respiratory tract infection 02/06/2020   Acute non-recurrent pansinusitis 02/06/2020   Special screening for malignant neoplasms, colon    Borderline diabetes 07/08/2014   Muscle ache 07/08/2014   Snores 07/08/2014   B12 deficiency 07/08/2014   Malaise and fatigue 12/26/2008   Allergic rhinitis 05/29/2008   Acid reflux 05/29/2008   Essential (primary) hypertension 05/29/2008   HLD (hyperlipidemia) 05/29/2008   Adiposity 05/29/2008   Arthritis, degenerative 05/29/2008     Past Surgical History:  Procedure Laterality Date   CATARACT EXTRACTION Bilateral 2003 & 2005   COLONOSCOPY     COLONOSCOPY WITH PROPOFOL N/A 07/04/2015   Procedure: COLONOSCOPY WITH PROPOFOL;  Surgeon: Lucilla Lame, MD;  Location: [place];  Service:  Endoscopy;  Laterality: N/A;  prefers early   VAGINAL HYSTERECTOMY  2005   Bilateral oophorectomy     Prior to Admission medications   Medication Sig Start Date End Date Taking? Authorizing Provider  aspirin 81 MG tablet Take 1 tablet by mouth daily. 05/29/08   [provider]  Azelaic Acid 15 % cream Apply 1 application topically in the morning and at bedtime. 02/05/20   [provider]  benazepril (LOTENSIN) 40 MG tablet TAKE 1 TABLET(40 MG) BY MOUTH DAILY 05/20/20   Jerrol Banana., MD  Biotin 10 MG CAPS Take by mouth.    [provider]  bisoprolol-hydrochlorothiazide [facility]) 10-6.25 MG tablet TAKE 1 TABLET BY MOUTH DAILY 11/22/19   Jerrol Banana., MD  chlorpheniramine-HYDROcodone [facility]) 10-8 MG/5ML SUER Take 5 mLs by mouth every 12 (twelve) hours as needed for cough. 02/06/20   Flinchum, Kelby Aline, FNP  Cholecalciferol (VITAMIN D) 2000 units CAPS Take by mouth daily.    [provider]  Magnesium 250 MG TABS Take by mouth.    [provider]  Magnesium 65 MG TABS Take 1 tablet by mouth daily. 04/08/09   [provider]  Misc Natural Products (GLUCOSAMINE CHONDROITIN COMPLX) TABS Take 1 tablet by mouth daily.    [provider]  Multiple Vitamin (MULTIVITAMIN) tablet Take 1 tablet by mouth daily.    [provider]  naproxen (NAPROSYN) 500 MG tablet TAKE 1 TABLET(500 MG) BY MOUTH TWICE DAILY WITH A MEAL 05/08/18   Miguel Aschoff  Kaylyn Lim., MD  omeprazole (PRILOSEC) 20 MG capsule Take 20 mg by mouth daily.    [provider]  simvastatin (ZOCOR) 20 MG tablet Take 1 tablet (20 mg total) by mouth at bedtime. 12/21/19   Jerrol Banana., MD     Allergies Patient has no known allergies.   Family History  Problem Relation Age of Onset   COPD Mother    Peripheral Artery Disease Mother    Osteoporosis Mother    Heart disease Father        fatal MI age 55   Hypertension  Sister    Glaucoma Sister    Breast cancer Maternal Aunt 70    Social History Social History   Tobacco Use   Smoking status: Never   Smokeless tobacco: Never  Vaping Use   Vaping Use: Never used  Substance Use Topics   Alcohol use: No   Drug use: No    Review of Systems  Constitutional:   No fever or chills.  ENT:   No sore throat. No rhinorrhea. Cardiovascular:   No chest pain or syncope. Respiratory:   No dyspnea or cough. Gastrointestinal:   Negative for abdominal pain, vomiting and diarrhea.  Musculoskeletal:   Pain at the right knee All other systems reviewed and are negative except as documented above in ROS and HPI.  ____________________________________________   PHYSICAL EXAM:  VITAL SIGNS: ED Triage Vitals  Enc Vitals Group     BP 08/29/20 1122 (!) 150/74     Pulse Rate 08/29/20 1122 (!) 59     Resp 08/29/20 1122 17     Temp 08/29/20 1122 98.5 F (36.9 C)     Temp Source 08/29/20 1122 Oral     SpO2 08/29/20 1122 100 %     Weight 08/29/20 1124 217 lb (98.4 kg)     Height 08/29/20 1124 5' 5.5" (1.664 m)     Head Circumference --      Peak Flow --      Pain Score 08/29/20 1124 0     Pain Loc --      Pain Edu? --      Excl. in [place]? --     Vital signs reviewed, nursing assessments reviewed.   Constitutional:   Alert and oriented. Non-toxic appearance. Eyes:   Conjunctivae are normal. EOMI. PERRL.  No nystagmus ENT      Head:   Normocephalic and atraumatic.      Nose:   Normal      Mouth/Throat:   Normal, moist mucosa, no facial droop      Neck:   No meningismus. Full ROM. Hematological/Lymphatic/Immunilogical:   No cervical lymphadenopathy. Cardiovascular:   RRR. Symmetric bilateral radial and DP pulses.  No murmurs. Cap refill less than 2 seconds. Respiratory:   Normal respiratory effort without tachypnea/retractions. Breath sounds are clear and equal bilaterally. No wheezes/rales/rhonchi. Gastrointestinal:   Soft and nontender. Non distended.  There is no CVA tenderness.  No rebound, rigidity, or guarding. Genitourinary:   deferred Musculoskeletal:   Normal range of motion in all extremities. No joint effusions.  No lower extremity tenderness.  No edema.  Right knee nontender, joint stable Neurologic:   Normal speech and language.  Cranial nerves III through XII intact There is drift of the right leg.  Other extremities no drift. Impaired RLE heel-to-shin. Normal finger-nose-finger. NIHSS 2 (drift, ataxia) Skin:    Skin is warm, dry and intact. No rash noted.  No petechiae, purpura, or bullae.  ____________________________________________    LABS (pertinent positives/negatives) (all labs ordered are listed, but only abnormal results are displayed) Labs Reviewed  BASIC METABOLIC PANEL - Abnormal; Notable for the following components:      Result Value   Glucose, Bld 100 (*)    All other components within normal limits  CBC WITH DIFFERENTIAL/PLATELET   ____________________________________________   EKG  Interpreted by me Sinus bradycardia rate of 59, normal axis and intervals.  Normal QRS ST segments and T waves.  ____________________________________________    RADIOLOGY  CT Head Wo Contrast  Result Date: 08/29/2020 CLINICAL DATA:  Transient ischemic attack (TIA). Right leg weakness. Off balance. EXAM: CT HEAD WITHOUT CONTRAST TECHNIQUE: Contiguous axial images were obtained from the base of the skull through the vertex without intravenous contrast. COMPARISON:  No pertinent prior exams available for comparison. FINDINGS: Brain: Mild generalized cerebral and cerebellar atrophy. Prominent perivascular space within the inferior right basal ganglia. There is no acute intracranial hemorrhage. No demarcated cortical infarct. No extra-axial fluid collection. No evidence of an intracranial mass. No midline shift. Vascular: No hyperdense vessel.  Atherosclerotic calcifications. Skull: Normal. Negative for fracture or focal  lesion. Sinuses/Orbits: Visualized orbits show no acute finding. Trace mucosal thickening within the bilateral ethmoid and visualized right maxillary sinuses. IMPRESSION: No evidence of acute intracranial abnormality. Mild generalized parenchymal atrophy. Trace paranasal sinus mucosal thickening at the imaged levels. Electronically Signed   By: Kellie Simmering DO   On: 08/29/2020 11:59    ____________________________________________   PROCEDURES Procedures  ____________________________________________  DIFFERENTIAL DIAGNOSIS   Knee arthritis, DVT, stroke  CLINICAL IMPRESSION / ASSESSMENT AND PLAN / ED COURSE  Medications ordered in the ED: Medications  LORazepam (ATIVAN) tablet 1 mg (1 mg Oral Given 08/29/20 1433)    Pertinent labs & imaging results that were available during my care of the patient were reviewed by me and considered in my medical decision making (see chart for details).  Sharon Small was evaluated in Emergency Department on 08/29/2020 for the symptoms described in the history of present illness. She was evaluated in the context of the global COVID-19 pandemic, which necessitated consideration that the patient might be at risk for infection with the SARS-CoV-2 virus that causes COVID-19. Institutional protocols and algorithms that pertain to the evaluation of patients at risk for COVID-19 are in a state of rapid change based on information released by regulatory bodies including the CDC and federal and state organizations. These policies and algorithms were followed during the patient's care in the ED.   Patient presents with feeling of weakness and giving out in the right leg.  Symptoms suggestive of stroke although very limited and not affecting the rest of her right side.  She believes her symptoms are due to knee injury.  Clinically there is no sign of contusion or fracture.  Will obtain MRI brain to further evaluate.  We will also get ultrasound of the right leg to look  for DVT given her recent sedentary habits.      ____________________________________________   FINAL CLINICAL IMPRESSION(S) / ED DIAGNOSES    Final diagnoses:  Right leg weakness     ED Discharge Orders     None       Portions of this Small were generated with dragon dictation software. Dictation errors may occur despite best attempts at proofreading.    Carrie Mew, MD 08/29/20 [phone]

## 2020-08-29 NOTE — ED Provider Notes (Signed)
-----------------------------------------   3:56 PM on 08/29/2020 -----------------------------------------  I received a call from radiology that the MRI shows acute left ACA territory infarct and findings concerning for a possible thrombus.  I consulted Dr. Rory Percy from neurology.  He requested a CT angio and will review the patient's imaging.  ----------------------------------------- 6:04 PM on 08/29/2020 -----------------------------------------  Perfusion study is negative.  There is no evidence of a thrombus.  I discussed the case again with Dr. Rory Percy.  Plan will be to admit here to [facility].  I consulted Dr. Tobie Poet from the hospitalist service for admission.   Arta Silence, MD 08/29/20 [phone]

## 2020-08-29 NOTE — ED Notes (Signed)
See triage note, pt reports right leg weakness that started this am causing impaired gait, fell in bathroom this am, denies hitting head.  Able to move all extremities. Full sensation. Clear speech. Alert and oriented

## 2020-08-29 NOTE — ED Triage Notes (Signed)
Pt reports that when she woke up this am she was off balance, bumping into the walls, when she told herself to concentrate she no longer was off balance. Below her right knee she does have a drift. No other symptoms. She did take 325 of ASA before coming.

## 2020-08-29 NOTE — ED Notes (Signed)
ECHO not completed

## 2020-08-29 NOTE — H&P (Signed)
History and Physical   Sharon Small DTO:[id number] DOB: January 06, 1951 DOA: 08/29/2020  PCP: Jerrol Banana., MD  Outpatient Specialists: Dr. Rockey Situ Patient coming from: home via EMS  I have personally briefly reviewed patient's old medical records in [place].  Chief Concern: stroke-like symptoms  HPI: Sharon Small is a 70 y.o. female with medical history significant for hypertension, hyperlipidemia, acid reflux, presents to the emergency department for chief concerns of imbalance.  Patient states that she woke up at 6 AM as patient and her husband were planning a trip to [place].  At approximately 6:30A-7 AM, patient felt like she was dragging her feet, and off balance.  Her husband, Sharon Small told her she needs to go to the ED. She she agreed however wanted to take a shower first.  She then ambulated towards the bathroom and fell between the wall and shower. Her husband helped get her straightened out and she finished showering, shaving her legs, doing her hair prior to presenting to the emergency department.  She denies right leg pain, numbness.  She reports the 'lazy' feeling is persistent. Patient woke up in the a.m. feeling off balance and bumping into walls. She endorses right lower extremity weakness and difficulty ambulating.  She states that she fell in the bathroom in the morning however denies hitting her head. She states the sensation never went away. She has never experienced this before.   She states that at baseline she takes daily baby aspirin.  Social history: She lives at home with her husband and is currently retired.  She denies tobacco, EtOH use, recreational drug use.  She formally worked for [employer] for 35 years.  Vaccination history: She is vaccinated for covid 19, 3 doses of Pfizer  ROS: Constitutional: no weight change, no fever ENT/Mouth: no sore throat, no rhinorrhea Eyes: no eye pain, no vision changes Cardiovascular: no chest pain, no  dyspnea,  no edema, no palpitations Respiratory: no cough, no sputum, no wheezing Gastrointestinal: no nausea, no vomiting, no diarrhea, no constipation Genitourinary: no urinary incontinence, no dysuria, no hematuria Musculoskeletal: no arthralgias, no myalgias Skin: no skin lesions, no pruritus, Neuro: + weakness, no loss of consciousness, no syncope Psych: no anxiety, no depression, + decrease appetite Heme/Lymph: no bruising, no bleeding  ED Course: Discussed with emergency medicine provider, patient requiring hospitalization for a stroke.  Vitals in the emergency department was remarkable for temperature of 98.5, respiration rate of 17, heart rate of 59, blood pressure 150/74, SPO2 of 100% on room air.  Labs in the emergency department was remarkable for sodium 140, potassium 4.1, chloride 104, bicarb 30, BUN 20, serum creatinine of 0.74, nonfasting blood glucose 100, EGFR greater than 60, WBC 7.5, hemoglobin 13.1, platelets 280.  EDP gave patient 1 dose of Ativan 1 mg.  Assessment/Plan  Principal Problem:   Acute ischemic left ACA stroke (HCC) Active Problems:   Acid reflux   Essential (primary) hypertension   HLD (hyperlipidemia)   Malaise and fatigue   Arthritis, degenerative   # Stroke-like symptoms - Patient is status post 1 dose of 325 mg of aspirin prior to presentation to the emergency department - CT head without contrast read as: No evidence of acute intracranial abnormality.  Mild generalized parenchymal atrophy.  Trace paranasal sinus mucosal thickening. - MRI of the brain was ordered and was read as acute left ACA territory infarcts in the parasagittal cortical and subcortical left frontal lobe, left eccentric anterior body of the corpus callosum and  high left frontal white matter.  Mild associated edema without mass-effect.   - Neurology has been consulted and we appreciate further recommendations - Complete echo - Fasting lipid and A1c ordered - Check TSH, B12,  HIV  - Permissive hypertension, hydralazine 5 mg IV every 4 hours as needed for SBP greater than 220, 4 doses ordered - Resumed home aspirin 81 mg in the a.m. at this time, a.m. team to follow-up with neurology for further recommendation - Frequent neuro vascular checks - N.p.o. pending swallow study - PT, OT, SLP - Fall precaution and aspiration precaution  # GERD-PPI resumed  # Hypertension-permissive hypertension as above -I have not resumed her home antihypertensive medications  # Hyperlipidemia-atorvastatin 40 mg nightly  # Pharmacologic DVT prophylaxis-a.m. team to initiate pharmacologic DVT prophylaxis when appropriate - I did not order given patient recently had a stroke and I am concerned and want to prevent possible hemorrhagic conversion  Chart reviewed.   DVT prophylaxis: SCDs Code Status: Full code Diet: Heart healthy Family Communication: Updated spouse at bedside Disposition Plan: Pending clinical course Consults called: Neurology was consulted by EDP Admission status: MedSurg, observation, telemetry  Past Medical History:  Diagnosis Date   GERD (gastroesophageal reflux disease)    Hypertension    Snores    Past Surgical History:  Procedure Laterality Date   CATARACT EXTRACTION Bilateral 2003 & 2005   COLONOSCOPY     COLONOSCOPY WITH PROPOFOL N/A 07/04/2015   Procedure: COLONOSCOPY WITH PROPOFOL;  Surgeon: Lucilla Lame, MD;  Location: [facility];  Service: Endoscopy;  Laterality: N/A;  prefers early   VAGINAL HYSTERECTOMY  2005   Bilateral oophorectomy   Social History:  reports that she has never smoked. She has never used smokeless tobacco. She reports that she does not drink alcohol and does not use drugs.  No Known Allergies Family History  Problem Relation Age of Onset   COPD Mother    Peripheral Artery Disease Mother    Osteoporosis Mother    Heart disease Father        fatal MI age 30   Hypertension Sister    Glaucoma Sister     Breast cancer Maternal Aunt 70   Family history: Family history reviewed and pertinent for history of stroke in paternal uncles. Father passed from a heart attack at age 55 year old.   Prior to Admission medications   Medication Sig Start Date End Date Taking? Authorizing Provider  aspirin 81 MG tablet Take 1 tablet by mouth daily. 05/29/08   [provider]  Azelaic Acid 15 % cream Apply 1 application topically in the morning and at bedtime. 02/05/20   [provider]  benazepril (LOTENSIN) 40 MG tablet TAKE 1 TABLET(40 MG) BY MOUTH DAILY 05/20/20   Jerrol Banana., MD  Biotin 10 MG CAPS Take by mouth.    [provider]  bisoprolol-hydrochlorothiazide [facility]) 10-6.25 MG tablet TAKE 1 TABLET BY MOUTH DAILY 11/22/19   Jerrol Banana., MD  chlorpheniramine-HYDROcodone [facility]) 10-8 MG/5ML SUER Take 5 mLs by mouth every 12 (twelve) hours as needed for cough. 02/06/20   Flinchum, Kelby Aline, FNP  Cholecalciferol (VITAMIN D) 2000 units CAPS Take by mouth daily.    [provider]  Magnesium 250 MG TABS Take by mouth.    [provider]  Magnesium 65 MG TABS Take 1 tablet by mouth daily. 04/08/09   [provider]  Misc Natural Products (GLUCOSAMINE CHONDROITIN COMPLX) TABS Take 1  tablet by mouth daily.    [provider]  Multiple Vitamin (MULTIVITAMIN) tablet Take 1 tablet by mouth daily.    [provider]  naproxen (NAPROSYN) 500 MG tablet TAKE 1 TABLET(500 MG) BY MOUTH TWICE DAILY WITH A MEAL 05/08/18   Jerrol Banana., MD  omeprazole (PRILOSEC) 20 MG capsule Take 20 mg by mouth daily.    [provider]  simvastatin (ZOCOR) 20 MG tablet Take 1 tablet (20 mg total) by mouth at bedtime. 12/21/19   Jerrol Banana., MD   Physical Exam: Vitals:   08/29/20 1122 08/29/20 1124 08/29/20 1415 08/29/20 1712  BP: (!) 150/74  (!) 178/76 (!) 117/101  Pulse: (!) 59  65 (!) 58  Resp: 17   (!) 22 18  Temp: 98.5 F (36.9 C)     TempSrc: Oral     SpO2: 100%  100% 100%  Weight:  98.4 kg    Height:  5' 5.5" (1.664 m)     Constitutional: appears younger than chronological age, NAD, calm, comfortable Eyes: PERRL, lids and conjunctivae normal ENMT: Mucous membranes are moist. Posterior pharynx clear of any exudate or lesions. Age-appropriate dentition. Hearing appropriate Neck: normal, supple, no masses, no thyromegaly Respiratory: clear to auscultation bilaterally, no wheezing, no crackles. Normal respiratory effort. No accessory muscle use.  Cardiovascular: Regular rate and rhythm, no murmurs / rubs / gallops. No extremity edema. 2+ pedal pulses. No carotid bruits.  Abdomen: no tenderness, no masses palpated, no hepatosplenomegaly. Bowel sounds positive.  Musculoskeletal: no clubbing / cyanosis. No joint deformity upper and lower extremities. Good ROM, no contractures, no atrophy. Normal muscle tone.  Skin: no rashes, lesions, ulcers. No induration Neurologic: Sensation intact. Strength 5/5 in all 4.  Psychiatric: Normal judgment and insight. Alert and oriented x 3. Normal mood.   EKG: independently reviewed, showing sinus bradycardia with rate of 59, qtc 435  Chest x-ray on Admission: I personally reviewed and I agree with radiologist reading as below.  CT Head Wo Contrast  Result Date: 08/29/2020 CLINICAL DATA:  Transient ischemic attack (TIA). Right leg weakness. Off balance. EXAM: CT HEAD WITHOUT CONTRAST TECHNIQUE: Contiguous axial images were obtained from the base of the skull through the vertex without intravenous contrast. COMPARISON:  No pertinent prior exams available for comparison. FINDINGS: Brain: Mild generalized cerebral and cerebellar atrophy. Prominent perivascular space within the inferior right basal ganglia. There is no acute intracranial hemorrhage. No demarcated cortical infarct. No extra-axial fluid collection. No evidence of an intracranial mass. No  midline shift. Vascular: No hyperdense vessel.  Atherosclerotic calcifications. Skull: Normal. Negative for fracture or focal lesion. Sinuses/Orbits: Visualized orbits show no acute finding. Trace mucosal thickening within the bilateral ethmoid and visualized right maxillary sinuses. IMPRESSION: No evidence of acute intracranial abnormality. Mild generalized parenchymal atrophy. Trace paranasal sinus mucosal thickening at the imaged levels. Electronically Signed   By: Kellie Simmering DO   On: 08/29/2020 11:59   MR BRAIN WO CONTRAST  Result Date: 08/29/2020 CLINICAL DATA:  Neuro deficit, acute stroke suspected. EXAM: MRI HEAD WITHOUT CONTRAST TECHNIQUE: Multiplanar, multiecho pulse sequences of the brain and surrounding structures were obtained without intravenous contrast. COMPARISON:  None. FINDINGS: Brain: Acute left ACA territory infarcts in the parasagittal cortical and subcortical left frontal lobe, left eccentric anterior body of the corpus callosum, and high left frontal white matter. Mild associated edema without mass effect. No acute hemorrhage, hydrocephalus, mass lesion, midline shift, or extra-axial fluid collections. Additional mild for age scattered T2/FLAIR  hyperintensities in the white matter, likely mild chronic microvascular ischemic disease. Vascular: Major arterial flow voids are maintained at the skull base. Paramidline small focus of susceptibility artifact anteriorly (series 13, image 37) which is nonspecific but could represent intraluminal thrombus within the left ACA given the above findings. Skull and upper cervical spine: Normal marrow signal. Sinuses/Orbits: Bilateral maxillary sinus retention cyst. Mild ethmoid air cell mucosal thickening. Other: No sizable mastoid effusions. IMPRESSION: 1. Acute left ACA territory infarcts in the parasagittal cortical and subcortical left frontal lobe, left eccentric anterior body of the corpus callosum, and the high left frontal white matter. Mild  associated edema without mass effect. 2. Paramidline small focus of susceptibility artifact anteriorly (series 13, image 37) which is nonspecific but could represent intraluminal thrombus within the left ACA given the above findings. Recommend CTA to further evaluate. Findings and recommendations discussed with Dr. Cherylann Banas at 3:40 p.m. via telephone. Electronically Signed   By: Margaretha Sheffield MD   On: 08/29/2020 15:43   US Venous Img Lower Unilateral Right  Result Date: 08/29/2020 CLINICAL DATA:  Right leg pain EXAM: RIGHT LOWER EXTREMITY VENOUS DOPPLER ULTRASOUND TECHNIQUE: Gray-scale sonography with graded compression, as well as color Doppler and duplex ultrasound were performed to evaluate the lower extremity deep venous systems from the level of the common femoral vein and including the common femoral, femoral, profunda femoral, popliteal and calf veins including the posterior tibial, peroneal and gastrocnemius veins when visible. The superficial great saphenous vein was also interrogated. Spectral Doppler was utilized to evaluate flow at rest and with distal augmentation maneuvers in the common femoral, femoral and popliteal veins. COMPARISON:  None. FINDINGS: Contralateral Common Femoral Vein: Respiratory phasicity is normal and symmetric with the symptomatic side. No evidence of thrombus. Normal compressibility. Common Femoral Vein: No evidence of thrombus. Normal compressibility, respiratory phasicity and response to augmentation. Saphenofemoral Junction: No evidence of thrombus. Normal compressibility and flow on color Doppler imaging. Profunda Femoral Vein: No evidence of thrombus. Normal compressibility and flow on color Doppler imaging. Femoral Vein: No evidence of thrombus. Normal compressibility, respiratory phasicity and response to augmentation. Popliteal Vein: No evidence of thrombus. Normal compressibility, respiratory phasicity and response to augmentation. Calf Veins: No evidence of  thrombus. Normal compressibility and flow on color Doppler imaging. Superficial Great Saphenous Vein: No evidence of thrombus. Normal compressibility. Venous Reflux:  None. Other Findings:  None. IMPRESSION: No evidence of right lower extremity deep venous thrombosis. Electronically Signed   By: Davina Poke D.O.   On: 08/29/2020 16:01   CT ANGIO HEAD NECK W WO CM W PERF (CODE STROKE)  Result Date: 08/29/2020 CLINICAL DATA:  Focal neurologic deficit. Small area of acute infarct left anterior cerebral artery territory on MRI today. EXAM: CT ANGIOGRAPHY HEAD AND NECK CT PERFUSION BRAIN TECHNIQUE: Multidetector CT imaging of the head and neck was performed using the standard protocol during bolus administration of intravenous contrast. Multiplanar CT image reconstructions and MIPs were obtained to evaluate the vascular anatomy. Carotid stenosis measurements (when applicable) are obtained utilizing NASCET criteria, using the distal internal carotid diameter as the denominator. Multiphase CT imaging of the brain was performed following IV bolus contrast injection. Subsequent parametric perfusion maps were calculated using RAPID software. CONTRAST:  15m OMNIPAQUE IOHEXOL 350 MG/ML SOLN COMPARISON:  MRI head 08/30/2019.  CT head 08/30/2019 FINDINGS: CTA NECK FINDINGS Aortic arch: Standard branching. Imaged portion shows no evidence of aneurysm or dissection. No significant stenosis of the major arch vessel origins. Right carotid system: Normal  right carotid. Negative for atherosclerotic disease or stenosis Left carotid system: Mild atherosclerotic calcification left carotid bifurcation without significant stenosis. Vertebral arteries: Relatively small vertebral arteries bilaterally without stenosis. Skeleton: Cervical spine degenerative changes most notably in the facet joints. Mild endplate depressions T2, T3, T4 which are likely chronic. Correlate with any pain in the region. Other neck: Negative for mass or  adenopathy. Upper chest: Lung apices clear bilaterally. Review of the MIP images confirms the above findings CTA HEAD FINDINGS Anterior circulation: Cavernous carotid widely patent bilaterally without significant atherosclerotic disease. Both anterior cerebral arteries are widely patent without stenosis, occlusion or filling defect. Middle cerebral arteries are widely patent and normal bilaterally. Posterior circulation: Both vertebral arteries patent to the basilar. Small PICA on the left. Small AICA on the right. Small basilar without focal stenosis. Fetal origin of the posterior cerebral arteries bilaterally which are widely patent. Venous sinuses: Normal venous enhancement Anatomic variants: None Review of the MIP images confirms the above findings CT Brain Perfusion Findings: ASPECTS: 10 CBF (<30%) Volume: 1m Perfusion (Tmax>6.0s) volume: 058mMismatch Volume: 71m66mnfarction Location:None IMPRESSION: 1. CT perfusion negative for acute infarct or ischemia 2. Small acute infarct left anterior cerebral artery territory on MRI today. No large vessel occlusion or significant stenosis on CTA. 3. No significant carotid or vertebral artery stenosis in the neck. 4. Mild superior endplate compression fractures T2, T3, T4 which may be chronic. Correlate with symptoms or injury. Electronically Signed   By: ChaFranchot GalloD.   On: 08/29/2020 17:15    Labs on Admission: I have personally reviewed following labs  CBC: Recent Labs  Lab 08/29/20 1141  WBC 7.5  NEUTROABS 5.5  HGB 13.1  HCT 38.9  MCV 90.9  PLT [id number] Metabolic Panel: Recent Labs  Lab 08/29/20 1141  NA 140  K 4.1  CL 104  CO2 30  GLUCOSE 100*  BUN 20  CREATININE 0.74  CALCIUM 9.3   GFR: Estimated Creatinine Clearance: 76.8 mL/min (by C-G formula based on SCr of 0.74 mg/dL).  Urine analysis:    Component Value Date/Time   BILIRUBINUR negative 01/23/2016 1604   PROTEINUR trace 01/23/2016 1604   UROBILINOGEN 0.2 01/23/2016  1604   NITRITE negative 01/23/2016 1604   LEUKOCYTESUR moderate (2+) (A) 01/23/2016 1604   Dr. [facility]  If 7PM-7AM, please contact overnight-coverage provider If 7AM-7PM, please contact day coverage provider [email or web address]  08/29/2020, 6:08 PM

## 2020-08-30 ENCOUNTER — Observation Stay (HOSPITAL_BASED_OUTPATIENT_CLINIC_OR_DEPARTMENT_OTHER)
Admit: 2020-08-30 | Discharge: 2020-08-30 | Disposition: A | Discharge status: Home / Self Care | Payer: Medicare Other | Attending: Internal Medicine | Admitting: Internal Medicine

## 2020-08-30 DIAGNOSIS — I10 Essential (primary) hypertension: Secondary | ICD-10-CM | POA: Diagnosis not present

## 2020-08-30 DIAGNOSIS — E7800 Pure hypercholesterolemia, unspecified: Secondary | ICD-10-CM | POA: Diagnosis not present

## 2020-08-30 DIAGNOSIS — I63522 Cerebral infarction due to unspecified occlusion or stenosis of left anterior cerebral artery: Secondary | ICD-10-CM

## 2020-08-30 LAB — BASIC METABOLIC PANEL
Anion gap: 6 (ref 5–15)
BUN: 18 mg/dL (ref 8–23)
CO2: 31 mmol/L (ref 22–32)
Calcium: 8.8 mg/dL — ABNORMAL LOW (ref 8.9–10.3)
Chloride: 103 mmol/L (ref 98–111)
Creatinine, Ser: 0.73 mg/dL (ref 0.44–1.00)
GFR, Estimated: >60 mL/min (ref >60)
Glucose, Bld: 106 mg/dL — ABNORMAL HIGH (ref 70–99)
Potassium: 4.2 mmol/L (ref 3.5–5.1)
Sodium: 140 mmol/L (ref 135–145)

## 2020-08-30 LAB — CBC
HCT: 36.2 % (ref 36.0–46.0)
Hemoglobin: 12.1 g/dL (ref 12.0–15.0)
MCH: 31.1 pg (ref 26.0–34.0)
MCHC: 33.4 g/dL (ref 30.0–36.0)
MCV: 93.1 fL (ref 80.0–100.0)
Platelets: 235 10*3/uL (ref 150–400)
RBC: 3.89 MIL/uL (ref 3.87–5.11)
RDW: 13.1 % (ref 11.5–15.5)
WBC: 6.2 10*3/uL (ref 4.0–10.5)
nRBC: 0 % (ref 0.0–0.2)

## 2020-08-30 LAB — ECHOCARDIOGRAM COMPLETE
AR max vel: 1.89 cm2
AV Peak grad: 3.3 mmHg
Ao pk vel: 0.91 m/s
Area-P 1/2: 3.2 cm2
Height: 65.5 in
S' Lateral: 2.6 cm
Weight: 3472 oz

## 2020-08-30 LAB — LIPID PANEL
Cholesterol: 151 mg/dL (ref 0–200)
HDL: 43 mg/dL (ref >40)
LDL Cholesterol: 85 mg/dL (ref 0–99)
Total CHOL/HDL Ratio: 3.5 RATIO
Triglycerides: 115 mg/dL (ref <150)
VLDL: 23 mg/dL (ref 0–40)

## 2020-08-30 LAB — HIV ANTIBODY (ROUTINE TESTING W REFLEX): HIV Screen 4th Generation wRfx: REACTIVE — AB

## 2020-08-30 LAB — SARS CORONAVIRUS 2 (TAT 6-24 HRS): SARS Coronavirus 2: NEGATIVE

## 2020-08-30 LAB — MAGNESIUM: Magnesium: 2 mg/dL (ref 1.7–2.4)

## 2020-08-30 MED ORDER — CLOPIDOGREL BISULFATE 75 MG PO TABS
75.0000 mg | ORAL_TABLET | Freq: Every day | ORAL | Status: DC
  Administered 2020-08-30: 75 mg via ORAL
  Filled 2020-08-30: qty 1

## 2020-08-30 MED ORDER — PERFLUTREN LIPID MICROSPHERE
1.0000 mL | INTRAVENOUS | Status: AC | PRN
  Administered 2020-08-30: 3 mL via INTRAVENOUS
  Filled 2020-08-30: qty 10

## 2020-08-30 MED ORDER — CLOPIDOGREL BISULFATE 75 MG PO TABS
75.0000 mg | ORAL_TABLET | Freq: Every day | ORAL | 0 refills | Status: DC
Start: 2020-08-31 — End: 2020-09-15

## 2020-08-30 NOTE — ED Notes (Signed)
Pt. Up indep to bathroom. Linens rearranged. Updated on POC.

## 2020-08-30 NOTE — Progress Notes (Signed)
*  PRELIMINARY RESULTS* Echocardiogram 2D Echocardiogram has been performed. Definity IV Contrast used on this study.  Sharon Small 08/30/2020, 11:44 AM

## 2020-08-30 NOTE — ED Notes (Signed)
Pt ambulating in the hall appropriately, follows commands and is eating and drinking appropriately.

## 2020-08-30 NOTE — Consult Note (Addendum)
Neurology Consultation  Reason for Consult: Stroke in the left ACA territory, right leg weakness Referring Physician: Dr. Jimmye Norman, hospitalist  CC: Right leg weakness  History is obtained from: Patient, chart  HPI: Sharon Small is a 70 y.o. female past medical history of hypertension and possible sleep apnea, presented to the emergency room for sudden onset of right leg weakness that she noted after waking up yesterday morning around 6 AM on 08/29/2020.  She came into the ED with complaints of feeling off balance when she woke up this morning but she felt that her leg strength was probably okay but very soon noted that the right leg was not holding up and was giving out at the knee.  Her symptoms did not improve which made her come to the emergency room.  She had also complained of some imbalance/dizziness at that time but no tingling numbness or weakness.  No headache.  No arm or face weakness.  No slurred speech. Given isolated leg symptoms, code stroke was not activated but she was evaluated with brain imaging in the form of CT of the head and MRI of the head.  The MRI of the head showed a right ACA territory infarction and this was followed up by a CT angiogram and CT perfusion study that did not show any emergent large vessel occlusion or perfusion deficit. She was admitted for further work-up. She denies any history of atrial fibrillation.  Denies any history of prior strokes but does have family history of hypertension and strokes in multiple family members    LKW: Sometime on 08/28/2020 when she went to bed tpa given?: no, outside the window Premorbid modified Rankin scale (mRS): 0  ROS: Full ROS was performed and is negative except as noted in the HPI.   Past Medical History:  Diagnosis Date   GERD (gastroesophageal reflux disease)    Hypertension    Snores         Family History  Problem Relation Age of Onset   COPD Mother    Peripheral Artery Disease Mother     Osteoporosis Mother    Heart disease Father        fatal MI age 62   Hypertension Sister    Glaucoma Sister    Breast cancer Maternal Aunt 70     Social History:   reports that she has never smoked. She has never used smokeless tobacco. She reports that she does not drink alcohol and does not use drugs.  Medications  Current Facility-Administered Medications:     stroke: mapping our early stages of recovery book, , Does not apply, Once, Cox, Amy N, DO   acetaminophen (TYLENOL) tablet 650 mg, 650 mg, Oral, Q4H PRN **OR** acetaminophen (TYLENOL) 160 MG/5ML solution 650 mg, 650 mg, Per Tube, Q4H PRN **OR** acetaminophen (TYLENOL) suppository 650 mg, 650 mg, Rectal, Q4H PRN, Cox, Amy N, DO   aspirin EC tablet 81 mg, 81 mg, Oral, Daily, Cox, Amy N, DO   atorvastatin (LIPITOR) tablet 40 mg, 40 mg, Oral, QHS, Cox, Amy N, DO, 40 mg at 08/29/20 2149   hydrALAZINE (APRESOLINE) injection 5 mg, 5 mg, Intravenous, Q4H PRN, Cox, Amy N, DO   pantoprazole (PROTONIX) EC tablet 40 mg, 40 mg, Oral, Daily, Cox, Amy N, DO  Current Outpatient Medications:    aspirin 81 MG tablet, Take 1 tablet by mouth daily., Disp: , Rfl:    Azelaic Acid 15 % cream, Apply 1 application topically in the morning and at bedtime.,  Disp: , Rfl:    benazepril (LOTENSIN) 40 MG tablet, TAKE 1 TABLET(40 MG) BY MOUTH DAILY, Disp: 90 tablet, Rfl: 0   Biotin 10 MG CAPS, Take by mouth., Disp: , Rfl:    bisoprolol-hydrochlorothiazide (ZIAC) 10-6.25 MG tablet, TAKE 1 TABLET BY MOUTH DAILY, Disp: 90 tablet, Rfl: 3   Cholecalciferol (VITAMIN D) 2000 units CAPS, Take by mouth daily., Disp: , Rfl:    Magnesium 250 MG TABS, Take 250 mg by mouth daily., Disp: , Rfl:    Misc Natural Products (GLUCOSAMINE CHONDROITIN COMPLX) TABS, Take 1 tablet by mouth in the morning and at bedtime., Disp: , Rfl:    Multiple Vitamin (MULTIVITAMIN) tablet, Take 1 tablet by mouth daily., Disp: , Rfl:    omeprazole (PRILOSEC) 20 MG capsule, Take 20 mg by mouth  daily., Disp: , Rfl:    simvastatin (ZOCOR) 20 MG tablet, Take 1 tablet (20 mg total) by mouth at bedtime., Disp: 90 tablet, Rfl: 3   white petrolatum (VASELINE) GEL, Apply 1 application topically as needed for lip care. For dry lips, Disp: , Rfl:    chlorpheniramine-HYDROcodone (TUSSIONEX PENNKINETIC ER) 10-8 MG/5ML SUER, Take 5 mLs by mouth every 12 (twelve) hours as needed for cough. (Patient not taking: Reported on 08/29/2020), Disp: 140 mL, Rfl: 0   Magnesium 65 MG TABS, Take 1 tablet by mouth daily., Disp: , Rfl:    naproxen (NAPROSYN) 500 MG tablet, TAKE 1 TABLET(500 MG) BY MOUTH TWICE DAILY WITH A MEAL (Patient not taking: Reported on 08/29/2020), Disp: 60 tablet, Rfl: 1   Exam: Current vital signs: BP (!) 148/58   Pulse (!) 56   Temp 98.5 F (36.9 C) (Oral)   Resp 16   Ht 5' 5.5" (1.664 m)   Wt 98.4 kg   SpO2 96%   BMI 35.56 kg/m  Vital signs in last 24 hours: Temp:  [98.5 F (36.9 C)] 98.5 F (36.9 C) (07/15 1122) Pulse Rate:  [49-116] 56 (07/16 0557) Resp:  [14-22] 16 (07/16 0557) BP: (103-178)/(58-101) 148/58 (07/16 0557) SpO2:  [91 %-100 %] 96 % (07/16 0738) Weight:  [98.4 kg] 98.4 kg (07/15 1124) General examination: Awake alert in no distress, very pleasant HEENT: Normocephalic/atraumatic CVS: Regular rate rhythm Respiratory: Breathing well saturating normally on room air, chest clear Abdomen nondistended, nontender Extremities warm well perfused Neurological exam Awake alert oriented x3 No dysarthria No aphasia Cranial nerve examination: Pupils equal round react light, extraocular movements intact, visual fields full, facial sensation intact, face symmetric, auditory acuity intact, palate midline, shoulder shrug intact, tongue midline. Motor examination with subtle weakness in the right lower extremity without drift-4+/5.  Rest of the 3 extremities with 5/5 strength. Sensation intact to light touch without extinction Coordination: Question mild dysmetria in the  right heel-knee-shin testing but otherwise unremarkable NIH stroke scale-1 for dysmetria.  Labs I have reviewed labs in epic and the results pertinent to this consultation are:   CBC    Component Value Date/Time   WBC 6.2 08/30/2020 0551   RBC 3.89 08/30/2020 0551   HGB 12.1 08/30/2020 0551   HGB 12.8 01/08/2020 0735   HCT 36.2 08/30/2020 0551   HCT 38.1 01/08/2020 0735   PLT 235 08/30/2020 0551   PLT 279 01/08/2020 0735   MCV 93.1 08/30/2020 0551   MCV 90 01/08/2020 0735   MCH 31.1 08/30/2020 0551   MCHC 33.4 08/30/2020 0551   RDW 13.1 08/30/2020 0551   RDW 12.4 01/08/2020 0735   LYMPHSABS 1.4 08/29/2020 1141  LYMPHSABS 1.9 01/08/2020 0735   MONOABS 0.5 08/29/2020 1141   EOSABS 0.1 08/29/2020 1141   EOSABS 0.1 01/08/2020 0735   BASOSABS 0.0 08/29/2020 1141   BASOSABS 0.0 01/08/2020 0735    CMP     Component Value Date/Time   NA 140 08/30/2020 0551   NA 142 01/08/2020 0735   K 4.2 08/30/2020 0551   CL 103 08/30/2020 0551   CO2 31 08/30/2020 0551   GLUCOSE 106 (H) 08/30/2020 0551   BUN 18 08/30/2020 0551   BUN 16 01/08/2020 0735   CREATININE 0.73 08/30/2020 0551   CALCIUM 8.8 (L) 08/30/2020 0551   PROT 6.9 01/08/2020 0735   ALBUMIN 4.5 01/08/2020 0735   AST 19 01/08/2020 0735   ALT 21 01/08/2020 0735   ALKPHOS 64 01/08/2020 0735   BILITOT 0.2 01/08/2020 0735   GFRNONAA >60 08/30/2020 0551   GFRAA 102 01/08/2020 0735    Lipid Panel     Component Value Date/Time   CHOL 151 08/30/2020 0551   CHOL 172 01/08/2020 0735   TRIG 115 08/30/2020 0551   HDL 43 08/30/2020 0551   HDL 47 01/08/2020 0735   CHOLHDL 3.5 08/30/2020 0551   VLDL 23 08/30/2020 0551   LDLCALC 85 08/30/2020 0551   LDLCALC 97 01/08/2020 0735   LDL 85 A1c-pending  Imaging I have reviewed the images obtained: CT head-no acute changes. MRI brain without contrast-acute left ACA territory infarcts in the parasagittal cortical and subcortical left frontal lobe, left eccentric anterior body  of the corpus callosum and high left frontal white matter.  Mild associated edema without mass-effect. Susceptibility artifact concerning for an ACA thrombus. CT angiography of the head and neck that ensued did not reveal an emergent large vessel occlusion.  CT perfusion negative for mismatch.  Assessment:  70 year old woman past medical history of hypertension and possible sleep apnea presented with sudden onset of right leg weakness-outside the window for tPA and vascular imaging that showed no reachable occlusion or stenosis and no perfusion deficit with MRI of the brain revealing a left ACA stroke. No significant stenosis in the carotid or ACA. Highly suspicious for cardioembolic etiology. Stroke work-up underway.  Impression: Acute ischemic stroke involving the right ACA territory-likely embolic  Recommendations: -Frequent neurochecks -Telemetry -Permissive hypertension-treat only if systolic blood pressure is greater than 220 on a as needed basis. -At home on aspirin 81-I would recommend aspirin 81 and Plavix 75 for 3 weeks.  This should be followed by aspirin only. -High intensity statin for goal LDL less than 70. -The echo pending-was being done today.  Will await results. -If there is no atrial fibrillation caught on telemetry and the echo is unremarkable, she will need an outpatient 30-day cardiac monitoring and if that also remains unremarkable, she may might be a candidate for TEE/loop recorder placement as an outpatient. -PT OT speech therapy   I will follow the results. Plan d/w Dr. Jimmye Norman.   ADDENDUM Echo WNL and no cardac source. Cardiology aware and will send monitor at home. Needs cards and neurology follow up in 4-6 weeks. D/w Dr Jimmye Norman  -- Amie Portland, MD Neurologist [facility] Pager: [phone]

## 2020-08-30 NOTE — Evaluation (Signed)
Occupational Therapy Evaluation Patient Details Name: Sharon Small MRN: [id number] DOB: [birth year] Today's Date: 08/30/2020    History of Present Illness Sharon Small is a 70 y.o. female with medical history significant for hypertension, hyperlipidemia, acid reflux, presents to the emergency department for chief concerns of imbalance. Patient reports waking up in the AM feeling off balance and bumping into walls. She endorses right lower extremity weakness and difficulty ambulating. She states that she fell in the bathroom in the morning, however denies hitting her head.   Clinical Impression   Sharon Small presents today with generalized weakness, mildly impaired balance, and slightly reduced sensation in her R LE. She denies pain. Pt was able to [facility] get into and out of bed; ambulate in room w/o AD; and perform grooming, bathing, and dressing in standing. She displays slight LOB when standing on R LE while donning LB clothing. Pt was IND prior to hospital admission, and reports she feels "almost" back to baseline -- troubled only by a slight loss of sensation and ROM in R LE. Pt has a one-story house, good support at home, is active in community, walks regularly. Both pt and therapist agree that pt does not need any further OT services at this time. Provided educ to pt that, should she experience similar symptoms at a later date, she should seek medical care ASAP.    Follow Up Recommendations  No OT follow up    Equipment Recommendations  None recommended by OT    Recommendations for Other Services       Precautions / Restrictions Precautions Precautions: None Restrictions Weight Bearing Restrictions: No      Mobility Bed Mobility Overal bed mobility: Independent                  Transfers Overall transfer level: Independent                    Balance Overall balance assessment: Modified Independent                                          ADL either performed or assessed with clinical judgement   ADL Overall ADL's : Independent                                             Vision Patient Visual Report: No change from baseline       Perception     Praxis      Pertinent Vitals/Pain       Hand Dominance     Extremity/Trunk Assessment Upper Extremity Assessment Upper Extremity Assessment: Overall WFL for tasks assessed   Lower Extremity Assessment Lower Extremity Assessment: Overall WFL for tasks assessed       Communication Communication Communication: No difficulties   Cognition Arousal/Alertness: Awake/alert Behavior During Therapy: WFL for tasks assessed/performed Overall Cognitive Status: Within Functional Limits for tasks assessed                                     General Comments       Exercises Other Exercises Other Exercises: Bed mobility, transfers, bathing, UB and LB dressing. Provided stroke education.   Shoulder Instructions  Home Living Family/patient expects to be discharged to:: Private residence Living Arrangements: Spouse/significant other Available Help at Discharge: Family;Available 24 hours/day Type of Home: House Home Access: Stairs to enter [employer] of Steps: 4 Entrance Stairs-Rails: Right;Left;Can reach both Home Layout: One level     Bathroom Shower/Tub: Occupational psychologist: Handicapped height     Home Equipment: Grab bars - tub/shower;Grab bars - toilet;Shower seat - built in;Hand held shower head          Prior Functioning/Environment Level of Independence: Independent                 OT Problem List: Impaired balance (sitting and/or standing);Impaired sensation      OT Treatment/Interventions:      OT Goals(Current goals can be found in the care plan section) Acute Rehab OT Goals Patient Stated Goal: to go home ASAP OT Goal Formulation: With patient Time For Goal  Achievement: 09/13/20 Potential to Achieve Goals: Good  OT Frequency:     Barriers to D/C:            Co-evaluation              AM-PAC OT "6 Clicks" Daily Activity     Outcome Measure Help from another person eating meals?: None Help from another person taking care of personal grooming?: None Help from another person toileting, which includes using toliet, bedpan, or urinal?: None Help from another person bathing (including washing, rinsing, drying)?: None Help from another person to put on and taking off regular upper body clothing?: None Help from another person to put on and taking off regular lower body clothing?: None 6 Click Score: 24   End of Session    Activity Tolerance: Patient tolerated treatment well Patient left: in bed;with call bell/phone within reach;with nursing/sitter in room;with family/visitor present  OT Visit Diagnosis: Unsteadiness on feet (R26.81);Muscle weakness (generalized) (M62.81)                Time: 8786-7672 OT Time Calculation (min): 34 min Charges:  OT General Charges $OT Visit: 1 Visit OT Evaluation $OT Eval Low Complexity: 1 Low OT Treatments $Self Care/Home Management : 23-37 mins Sharon Lobo, PhD, MS, OTR/L 08/30/20, 11:09 AM

## 2020-08-30 NOTE — Progress Notes (Signed)
PT Cancellation Note  Patient Details Name: Sharon Small MRN: [id number] DOB: 1950/07/27   Cancelled Treatment:    Reason Eval/Treat Not Completed: Patient at procedure or test/unavailable. PT to attempt therapy evaluation 3x this AM. Pt unavailable with cardiology for ECHO, then OT, then SLP. PT will follow up with therapy intervention as appropriate. RN notified.  Herminio Commons, PT, DPT 9:54 AM,08/30/20

## 2020-08-30 NOTE — Evaluation (Signed)
Physical Therapy Evaluation Patient Details Name: Sharon Small MRN: [id number] DOB: 1950/04/08 Today's Date: 08/30/2020   History of Present Illness  Sharon Small is a 70 y.o. female with medical history significant for hypertension, hyperlipidemia, acid reflux, presents to the emergency department for chief concerns of imbalance. Patient reports waking up in the AM feeling off balance and bumping into walls. She endorses right lower extremity weakness and difficulty ambulating. She states that she fell in the bathroom in the morning, however denies hitting her head.   Clinical Impression  Pt is a 70 year old F admitted to hospital on 08/29/20 for acute ischemic L ACA stroke. At baseline, pt is independent with all ADL's, IADL's, and community ambulation without AD. Pt presents with adequate strength, endurance, safety awareness, and balance required for safe and independent functional mobility; however, pt does demonstrate mild impairments with high level balance tasks. 4-item DGI score of 10/12 indicates pt is at low risk of falls (<10/12 indicating high risk of falls), however, pt does present with x1 LOB with horizontal head turns to the R. R tandem and bil SLS score indicates the pt is at increased risk of falls. Pt was grossly Ind for bed mobility and transfers, and was provided supervision for safety with gait. Due to low level assist and good safety awareness, pt is not an appropriate candidate for skilled acute PT services at this time; please re-consult with any changes in status. Pt appropriate to ambulate with nursing staff while hospitalized (for safety), and for maintenance of independence with functional mobility prior to DC home. At this time, PT recommends DC home with referral to OPPT for balance to decrease risk of falls.     Follow Up Recommendations Outpatient PT    Equipment Recommendations  None recommended by PT    Recommendations for Other Services        Precautions / Restrictions Precautions Precautions: None Restrictions Weight Bearing Restrictions: No      Mobility  Bed Mobility Overal bed mobility: Independent                  Transfers Overall transfer level: Independent                  Ambulation/Gait Ambulation/Gait assistance: Supervision Gait Distance (Feet): 400 Feet Assistive device: None       General Gait Details: Supervision for safety to ambulate without AD. Pt demonstrates reciprocal gait pattern with adequate heel strike, foot clearance, and toe off. Mild gait deviations noted during 4-item DGI score including: horizontal head turns bil and gait speed changes; x1 LOB with head turn to R which pt was able to correct. 4-item DGI score of 10/12 indicates pt is at low risk of falls (<10/12 indicating high risk of falls).     Balance Overall balance assessment: Modified Independent   Sitting balance-Leahy Scale: Normal       Standing balance-Leahy Scale: Good   Single Leg Stance - Right Leg: 4 Single Leg Stance - Left Leg: 3 Tandem Stance - Right Leg: 9 Tandem Stance - Left Leg: 15 Rhomberg - Eyes Opened: 30   High level balance activites: Braiding;Head turns High Level Balance Comments: Mild gait deficits noted during braiding and head turns. Pt able to maintain rhomberg and semi-tandem stance for 30s, but had increased difficulty with R tandem stance (9s hold) and SLS (<5s hold bil). No deficits noted with standing marching.  Pertinent Vitals/Pain Pain Assessment: No/denies pain    Home Living Family/patient expects to be discharged to:: Private residence Living Arrangements: Spouse/significant other Available Help at Discharge: Family;Available 24 hours/day Type of Home: House Home Access: Stairs to enter Entrance Stairs-Rails: Right;Left;Can reach both Entrance Stairs-Number of Steps: 4 Home Layout: One level Home Equipment: Grab bars - tub/shower;Grab bars -  toilet;Shower seat - built in;Hand held shower head      Prior Function Level of Independence: Independent               Extremity/Trunk Assessment   Upper Extremity Assessment Upper Extremity Assessment: Defer to OT evaluation    Lower Extremity Assessment Lower Extremity Assessment: Overall WFL for tasks assessed (grossly 5/5 with 4+/5 bil hip flexion strength; light touch sensation and tactile localization intact. heel to shin and alternating toe tap coordination intact bil.)    Cervical / Trunk Assessment Cervical / Trunk Assessment: Normal  Communication   Communication: No difficulties  Cognition Arousal/Alertness: Awake/alert Behavior During Therapy: WFL for tasks assessed/performed Overall Cognitive Status: Within Functional Limits for tasks assessed              General Comments: A&O x4 and able to follow 100% of 3-step commands         Exercises Other Exercises Other Exercises: Pt able to perform bed mobility, transfers, gait, 4-item DGI testing, and high level balance testing with grossly Ind-supervision. Increased gait deviations with high level balance activities. Other Exercises: Pt educated regarding: PT role/POC, DC recommendations, safety with mobility.   Assessment/Plan    PT Assessment All further PT needs can be met in the next venue of care  PT Problem List Decreased balance       PT Treatment Interventions      PT Goals (Current goals can be found in the Care Plan section)  Acute Rehab PT Goals Patient Stated Goal: to go home ASAP PT Goal Formulation: With patient Time For Goal Achievement: 09/13/20 Potential to Achieve Goals: Good     AM-PAC PT "6 Clicks" Mobility  Outcome Measure Help needed turning from your back to your side while in a flat bed without using bedrails?: None Help needed moving from lying on your back to sitting on the side of a flat bed without using bedrails?: None Help needed moving to and from a bed to a  chair (including a wheelchair)?: None Help needed standing up from a chair using your arms (e.g., wheelchair or bedside chair)?: None Help needed to walk in hospital room?: A Little Help needed climbing 3-5 steps with a railing? : A Little 6 Click Score: 22    End of Session Equipment Utilized During Treatment: Gait belt Activity Tolerance: Patient tolerated treatment well Patient left: in bed Nurse Communication: Mobility status PT Visit Diagnosis: Unsteadiness on feet (R26.81)    Time: 6578-4696 PT Time Calculation (min) (ACUTE ONLY): 19 min   Charges:   PT Evaluation $PT Eval Low Complexity: 1 Low         Herminio Commons, PT, DPT 12:20 PM,08/30/20

## 2020-08-30 NOTE — ED Notes (Signed)
Pt educated on discharge instructions for blood thinners

## 2020-08-30 NOTE — Discharge Summary (Signed)
Physician Discharge Summary  Sharon Small WNI:[id number] DOB: 1951-01-01 DOA: 08/29/2020  PCP: Jerrol Banana., MD  Admit date: 08/29/2020 Discharge date: 08/30/2020  Admitted From: home  Disposition:  home   Recommendations for Outpatient Follow-up:  Follow up with PCP in 1-2 weeks F/u w/ neuro, Dr. Melrose Nakayama or other outpatient neuro, in 1 week 30 day heart monitor will be mailed to you w/ directions & cardio will call you as well regarding this  Will need outpatient PT   Home Health: no  Equipment/Devices:  Discharge Condition: stable  CODE STATUS: full  Diet recommendation: Heart Healthy   Brief/Interim Summary: HPI was taken from Dr. Tobie Poet: Sharon Small is a 70 y.o. female with medical history significant for hypertension, hyperlipidemia, acid reflux, presents to the emergency department for chief concerns of imbalance.   Patient states that she woke up at 6 AM as patient and her husband were planning a trip to [place].  At approximately 6:30A-7 AM, patient felt like she was dragging her feet, and off balance.  Her husband, Richardson Landry told her she needs to go to the ED. She she agreed however wanted to take a shower first.  She then ambulated towards the bathroom and fell between the wall and shower. Her husband helped get her straightened out and she finished showering, shaving her legs, doing her hair prior to presenting to the emergency department.   She denies right leg pain, numbness.  She reports the 'lazy' feeling is persistent. Patient woke up in the a.m. feeling off balance and bumping into walls. She endorses right lower extremity weakness and difficulty ambulating.  She states that she fell in the bathroom in the morning however denies hitting her head. She states the sensation never went away. She has never experienced this before.    She states that at baseline she takes daily baby aspirin   Pt was found to have acute left ACA territory infarcts as per MRI. Pt  was treated w/ aspirin & plavix and will continue both for 21 days then aspirin thereafter as per neuro. Pt was already on statin at home. Of note, pt has hx of pre-DM w/ last HbA1c of 5.9  7 months ago and repeat HbA1c was pending at time of d/c. Echo showed EF 38-18%, normal diastolic function, no regional wall abnormalities and no atrial level shunts noted. Neuro recommended a 30 day heart monitor but unable to get over the weekend but one will be mailed to the pt w/ directions and cardio will call the pt as well as per cardio, Dr. Oval Linsey. No hx of a. fib/flutter. PT/OT evaluated the pt and PT only recommended outpatient PT.   Discharge Diagnoses:  Principal Problem:   Acute ischemic left ACA stroke (HCC) Active Problems:   Acid reflux   Essential (primary) hypertension   HLD (hyperlipidemia)   Malaise and fatigue   Arthritis, degenerative   Acute left ACA territory infracts: w/ mild associated edema w/o mass effect as per MRI brain. CT head shows no evidence of acute intracranial abnormality. Echo pending. Allow permissive HTN. Continue w/ neuro checks. PT/OT consulted. Neuro consulted   HTN: holding home anti-HTN meds to allow permissive HTN   HLD: continue on statin   Discharge Instructions  Discharge Instructions     Diet - low sodium heart healthy   Complete by: As directed    Discharge instructions   Complete by: As directed    F/u w/ neuro in 1 week. Can go  to Dr. Melrose Nakayama or find another neurologist to see outpatient. The 30 day heart monitor will be mailed to you w/ directions and cardiology will call you about this as well, Dr. Oval Linsey. F/u w/ PCP in 1-2 weeks. You need outpatient PT as well   Increase activity slowly   Complete by: As directed       Allergies as of 08/30/2020   No Known Allergies      Medication List     STOP taking these medications    naproxen 500 MG tablet Commonly known as: NAPROSYN       TAKE these medications    aspirin 81 MG  tablet Take 1 tablet by mouth daily.   Azelaic Acid 15 % gel Apply 1 application topically in the morning and at bedtime.   benazepril 40 MG tablet Commonly known as: LOTENSIN TAKE 1 TABLET(40 MG) BY MOUTH DAILY   Biotin 10 MG Caps Take by mouth.   bisoprolol-hydrochlorothiazide 10-6.25 MG tablet Commonly known as: ZIAC TAKE 1 TABLET BY MOUTH DAILY   clopidogrel 75 MG tablet Commonly known as: PLAVIX Take 1 tablet (75 mg total) by mouth daily for 21 days. Start taking on: August 31, 2020   Glucosamine Chondroitin Complx Tabs Take 1 tablet by mouth in the morning and at bedtime.   Magnesium 250 MG Tabs Take 250 mg by mouth daily.   Magnesium 65 MG Tabs Take 1 tablet by mouth daily.   multivitamin tablet Take 1 tablet by mouth daily.   omeprazole 20 MG capsule Commonly known as: PRILOSEC Take 20 mg by mouth daily.   simvastatin 20 MG tablet Commonly known as: ZOCOR Take 1 tablet (20 mg total) by mouth at bedtime.   Vitamin D 50 MCG (2000 UT) Caps Take by mouth daily.   white petrolatum Gel Commonly known as: VASELINE Apply 1 application topically as needed for lip care. For dry lips       ASK your doctor about these medications    chlorpheniramine-HYDROcodone 10-8 MG/5ML Suer Commonly known as: Tussionex Pennkinetic ER Take 5 mLs by mouth every 12 (twelve) hours as needed for cough.        Follow-up Information     Jerrol Banana., MD In 3 days.   Specialty: Family Medicine Contact information: [address] [place] [address] [phone]         [place].   Specialty: Emergency Medicine Why: If symptoms worsen Contact information: [place] [id number] [place] [address] [phone]               No Known Allergies  Consultations: Neuro    Procedures/Studies: CT Head Wo Contrast  Result Date: 08/29/2020 CLINICAL DATA:   Transient ischemic attack (TIA). Right leg weakness. Off balance. EXAM: CT HEAD WITHOUT CONTRAST TECHNIQUE: Contiguous axial images were obtained from the base of the skull through the vertex without intravenous contrast. COMPARISON:  No pertinent prior exams available for comparison. FINDINGS: Brain: Mild generalized cerebral and cerebellar atrophy. Prominent perivascular space within the inferior right basal ganglia. There is no acute intracranial hemorrhage. No demarcated cortical infarct. No extra-axial fluid collection. No evidence of an intracranial mass. No midline shift. Vascular: No hyperdense vessel.  Atherosclerotic calcifications. Skull: Normal. Negative for fracture or focal lesion. Sinuses/Orbits: Visualized orbits show no acute finding. Trace mucosal thickening within the bilateral ethmoid and visualized right maxillary sinuses. IMPRESSION: No evidence of acute intracranial abnormality. Mild generalized parenchymal atrophy.  Trace paranasal sinus mucosal thickening at the imaged levels. Electronically Signed   By: Kellie Simmering DO   On: 08/29/2020 11:59   MR BRAIN WO CONTRAST  Result Date: 08/29/2020 CLINICAL DATA:  Neuro deficit, acute stroke suspected. EXAM: MRI HEAD WITHOUT CONTRAST TECHNIQUE: Multiplanar, multiecho pulse sequences of the brain and surrounding structures were obtained without intravenous contrast. COMPARISON:  None. FINDINGS: Brain: Acute left ACA territory infarcts in the parasagittal cortical and subcortical left frontal lobe, left eccentric anterior body of the corpus callosum, and high left frontal white matter. Mild associated edema without mass effect. No acute hemorrhage, hydrocephalus, mass lesion, midline shift, or extra-axial fluid collections. Additional mild for age scattered T2/FLAIR hyperintensities in the white matter, likely mild chronic microvascular ischemic disease. Vascular: Major arterial flow voids are maintained at the skull base. Paramidline small focus of  susceptibility artifact anteriorly (series 13, image 37) which is nonspecific but could represent intraluminal thrombus within the left ACA given the above findings. Skull and upper cervical spine: Normal marrow signal. Sinuses/Orbits: Bilateral maxillary sinus retention cyst. Mild ethmoid air cell mucosal thickening. Other: No sizable mastoid effusions. IMPRESSION: 1. Acute left ACA territory infarcts in the parasagittal cortical and subcortical left frontal lobe, left eccentric anterior body of the corpus callosum, and the high left frontal white matter. Mild associated edema without mass effect. 2. Paramidline small focus of susceptibility artifact anteriorly (series 13, image 37) which is nonspecific but could represent intraluminal thrombus within the left ACA given the above findings. Recommend CTA to further evaluate. Findings and recommendations discussed with Dr. Cherylann Banas at 3:40 p.m. via telephone. Electronically Signed   By: Margaretha Sheffield MD   On: 08/29/2020 15:43   US Venous Img Lower Unilateral Right  Result Date: 08/29/2020 CLINICAL DATA:  Right leg pain EXAM: RIGHT LOWER EXTREMITY VENOUS DOPPLER ULTRASOUND TECHNIQUE: Gray-scale sonography with graded compression, as well as color Doppler and duplex ultrasound were performed to evaluate the lower extremity deep venous systems from the level of the common femoral vein and including the common femoral, femoral, profunda femoral, popliteal and calf veins including the posterior tibial, peroneal and gastrocnemius veins when visible. The superficial great saphenous vein was also interrogated. Spectral Doppler was utilized to evaluate flow at rest and with distal augmentation maneuvers in the common femoral, femoral and popliteal veins. COMPARISON:  None. FINDINGS: Contralateral Common Femoral Vein: Respiratory phasicity is normal and symmetric with the symptomatic side. No evidence of thrombus. Normal compressibility. Common Femoral Vein: No  evidence of thrombus. Normal compressibility, respiratory phasicity and response to augmentation. Saphenofemoral Junction: No evidence of thrombus. Normal compressibility and flow on color Doppler imaging. Profunda Femoral Vein: No evidence of thrombus. Normal compressibility and flow on color Doppler imaging. Femoral Vein: No evidence of thrombus. Normal compressibility, respiratory phasicity and response to augmentation. Popliteal Vein: No evidence of thrombus. Normal compressibility, respiratory phasicity and response to augmentation. Calf Veins: No evidence of thrombus. Normal compressibility and flow on color Doppler imaging. Superficial Great Saphenous Vein: No evidence of thrombus. Normal compressibility. Venous Reflux:  None. Other Findings:  None. IMPRESSION: No evidence of right lower extremity deep venous thrombosis. Electronically Signed   By: Davina Poke D.O.   On: 08/29/2020 16:01   ECHOCARDIOGRAM COMPLETE  Result Date: 08/30/2020    ECHOCARDIOGRAM REPORT   Patient Name:   TEIGHAN AUBERT Date of Exam: 08/30/2020 Medical Rec #:  [id number]        Height:       65.5 in Accession #:  [id number]       Weight:       217.0 lb Date of Birth:  December 07, 1950        BSA:          2.060 m Patient Age:    70 years         BP:           148/58 mmHg Patient Gender: F                HR:           60 bpm. Exam Location:  [facility] Procedure: 2D Echo and Intracardiac Opacification Agent Indications:     Stroke I63.9  History:         Patient has no prior history of Echocardiogram examinations.  Sonographer:     Kathlen Brunswick RDCS Referring Phys:  [id number] AMY N COX Diagnosing Phys: Skeet Latch MD  Sonographer Comments: Technically difficult study due to poor echo windows. IMPRESSIONS  1. Left ventricular ejection fraction, by estimation, is 55 to 60%. The left ventricle has normal function. The left ventricle has no regional wall motion abnormalities. There is mild concentric left ventricular hypertrophy.  Left ventricular diastolic parameters were normal.  2. Right ventricular systolic function is normal. The right ventricular size is normal.  3. The mitral valve is normal in structure. No evidence of mitral valve regurgitation. No evidence of mitral stenosis.  4. The aortic valve is normal in structure. There is mild calcification of the aortic valve. Aortic valve regurgitation is not visualized. No aortic stenosis is present.  5. The inferior vena cava is normal in size with greater than 50% respiratory variability, suggesting right atrial pressure of 3 mmHg. FINDINGS  Left Ventricle: Left ventricular ejection fraction, by estimation, is 55 to 60%. The left ventricle has normal function. The left ventricle has no regional wall motion abnormalities. Definity contrast agent was given IV to delineate the left ventricular  endocardial borders. The left ventricular internal cavity size was normal in size. There is mild concentric left ventricular hypertrophy. Left ventricular diastolic parameters were normal. Normal left ventricular filling pressure. Right Ventricle: The right ventricular size is normal. No increase in right ventricular wall thickness. Right ventricular systolic function is normal. Left Atrium: Left atrial size was normal in size. Right Atrium: Right atrial size was normal in size. Pericardium: There is no evidence of pericardial effusion. Mitral Valve: The mitral valve is normal in structure. No evidence of mitral valve regurgitation. No evidence of mitral valve stenosis. Tricuspid Valve: The tricuspid valve is normal in structure. Tricuspid valve regurgitation is trivial. No evidence of tricuspid stenosis. Aortic Valve: The aortic valve is normal in structure. There is mild calcification of the aortic valve. Aortic valve regurgitation is not visualized. No aortic stenosis is present. Aortic valve peak gradient measures 3.3 mmHg. Pulmonic Valve: The pulmonic valve was normal in structure. Pulmonic  valve regurgitation is not visualized. No evidence of pulmonic stenosis. Aorta: The aortic root is normal in size and structure. Venous: The inferior vena cava is normal in size with greater than 50% respiratory variability, suggesting right atrial pressure of 3 mmHg. IAS/Shunts: No atrial level shunt detected by color flow Doppler.  LEFT VENTRICLE PLAX 2D LVIDd:         3.70 cm  Diastology LVIDs:         2.60 cm  LV e' medial:    7.51 cm/s LV PW:         1.10 cm  LV E/e' medial:  8.7 LV IVS:        1.20 cm  LV e' lateral:   9.90 cm/s LVOT diam:     1.90 cm  LV E/e' lateral: 6.6 LV SV:         41 LV SV Index:   20 LVOT Area:     2.84 cm  RIGHT VENTRICLE RV Basal diam:  3.20 cm RV S prime:     11.50 cm/s TAPSE (M-mode): 1.9 cm LEFT ATRIUM             Index       RIGHT ATRIUM          Index LA diam:        4.00 cm 1.94 cm/m  RA Area:     6.66 cm LA Vol (A2C):   33.4 ml 16.22 ml/m RA Volume:   11.70 ml 5.68 ml/m LA Vol (A4C):   38.1 ml 18.50 ml/m LA Biplane Vol: 37.2 ml 18.06 ml/m  AORTIC VALVE                PULMONIC VALVE AV Area (Vmax): 1.89 cm    PV Vmax:       0.71 m/s AV Vmax:        90.90 cm/s  PV Peak grad:  2.0 mmHg AV Peak Grad:   3.3 mmHg LVOT Vmax:      60.70 cm/s LVOT Vmean:     38.500 cm/s LVOT VTI:       0.144 m  AORTA Ao Root diam: 2.40 cm Ao Asc diam:  3.30 cm MITRAL VALVE               TRICUSPID VALVE MV Area (PHT): 3.20 cm    TR Peak grad:   19.4 mmHg MV Decel Time: 237 msec    TR Vmax:        220.00 cm/s MV E velocity: 65.10 cm/s MV A velocity: 41.60 cm/s  SHUNTS MV E/A ratio:  1.56        Systemic VTI:  0.14 m                            Systemic Diam: 1.90 cm Skeet Latch MD Electronically signed by Skeet Latch MD Signature Date/Time: 08/30/2020/1:37:06 PM    Final    CT ANGIO HEAD NECK W WO CM W PERF (CODE STROKE)  Result Date: 08/29/2020 CLINICAL DATA:  Focal neurologic deficit. Small area of acute infarct left anterior cerebral artery territory on MRI today. EXAM: CT  ANGIOGRAPHY HEAD AND NECK CT PERFUSION BRAIN TECHNIQUE: Multidetector CT imaging of the head and neck was performed using the standard protocol during bolus administration of intravenous contrast. Multiplanar CT image reconstructions and MIPs were obtained to evaluate the vascular anatomy. Carotid stenosis measurements (when applicable) are obtained utilizing NASCET criteria, using the distal internal carotid diameter as the denominator. Multiphase CT imaging of the brain was performed following IV bolus contrast injection. Subsequent parametric perfusion maps were calculated using RAPID software. CONTRAST:  184mL OMNIPAQUE IOHEXOL 350 MG/ML SOLN COMPARISON:  MRI head 08/30/2019.  CT head 08/30/2019 FINDINGS: CTA NECK FINDINGS Aortic arch: Standard branching. Imaged portion shows no evidence of aneurysm or dissection. No significant stenosis of the major arch vessel origins. Right carotid system: Normal right carotid. Negative for atherosclerotic disease or stenosis Left carotid system: Mild atherosclerotic calcification left carotid bifurcation without significant stenosis. Vertebral arteries: Relatively small vertebral arteries bilaterally  without stenosis. Skeleton: Cervical spine degenerative changes most notably in the facet joints. Mild endplate depressions T2, T3, T4 which are likely chronic. Correlate with any pain in the region. Other neck: Negative for mass or adenopathy. Upper chest: Lung apices clear bilaterally. Review of the MIP images confirms the above findings CTA HEAD FINDINGS Anterior circulation: Cavernous carotid widely patent bilaterally without significant atherosclerotic disease. Both anterior cerebral arteries are widely patent without stenosis, occlusion or filling defect. Middle cerebral arteries are widely patent and normal bilaterally. Posterior circulation: Both vertebral arteries patent to the basilar. Small PICA on the left. Small AICA on the right. Small basilar without focal  stenosis. Fetal origin of the posterior cerebral arteries bilaterally which are widely patent. Venous sinuses: Normal venous enhancement Anatomic variants: None Review of the MIP images confirms the above findings CT Brain Perfusion Findings: ASPECTS: 10 CBF (<30%) Volume: 58mL Perfusion (Tmax>6.0s) volume: 57mL Mismatch Volume: 43mL Infarction Location:None IMPRESSION: 1. CT perfusion negative for acute infarct or ischemia 2. Small acute infarct left anterior cerebral artery territory on MRI today. No large vessel occlusion or significant stenosis on CTA. 3. No significant carotid or vertebral artery stenosis in the neck. 4. Mild superior endplate compression fractures T2, T3, T4 which may be chronic. Correlate with symptoms or injury. Electronically Signed   By: Franchot Gallo M.D.   On: 08/29/2020 17:15   (Echo, Carotid, EGD, Colonoscopy, ERCP)    Subjective:   Discharge Exam: Vitals:   08/30/20 0951 08/30/20 1430  BP: (!) 172/80 (!) 167/89  Pulse: 62 65  Resp: 18 18  Temp:    SpO2: 100% 100%   Vitals:   08/30/20 0738 08/30/20 0933 08/30/20 0951 08/30/20 1430  BP:   (!) 172/80 (!) 167/89  Pulse:  62 62 65  Resp:   18 18  Temp:      TempSrc:      SpO2: 96% 100% 100% 100%  Weight:      Height:        General: Pt is alert, awake, not in acute distress Cardiovascular: RRR, S1/S2 +, no rubs, no gallops Respiratory: CTA bilaterally, no wheezing, no rhonchi Abdominal: Soft, NT, ND, bowel sounds + Extremities: no edema, no cyanosis    The results of significant diagnostics from this hospitalization (including imaging, microbiology, ancillary and laboratory) are listed below for reference.     Microbiology: Recent Results (from the past 240 hour(s))  SARS CORONAVIRUS 2 (TAT 6-24 HRS) Nasopharyngeal Nasopharyngeal Swab     Status: None   Collection Time: 08/29/20  7:00 PM   Specimen: Nasopharyngeal Swab  Result Value Ref Range Status   SARS Coronavirus 2 NEGATIVE NEGATIVE Final     Comment: (NOTE) SARS-CoV-2 target nucleic acids are NOT DETECTED.  The SARS-CoV-2 RNA is generally detectable in upper and lower respiratory specimens during the acute phase of infection. Negative results do not preclude SARS-CoV-2 infection, do not rule out co-infections with other pathogens, and should not be used as the sole basis for treatment or other patient management decisions. Negative results must be combined with clinical observations, patient history, and epidemiological information. The expected result is Negative.  Fact Sheet for Patients: SugarRoll.be  Fact Sheet for Healthcare Providers: https://www.woods-mathews.com/  This test is not yet approved or cleared by the [place] FDA and  has been authorized for detection and/or diagnosis of SARS-CoV-2 by FDA under an Emergency Use Authorization (EUA). This EUA will remain  in effect (meaning this test can be used) for the  duration of the COVID-19 declaration under Se ction 564(b)(1) of the Act, 21 U.S.C. section 360bbb-3(b)(1), unless the authorization is terminated or revoked sooner.  Performed at [facility], [place] [address] [place], [place] [address]      Labs: BNP (last 3 results) No results for input(s): BNP in the last 8760 hours. Basic Metabolic Panel: Recent Labs  Lab 08/29/20 1141 08/30/20 0551  NA 140 140  K 4.1 4.2  CL 104 103  CO2 30 31  GLUCOSE 100* 106*  BUN 20 18  CREATININE 0.74 0.73  CALCIUM 9.3 8.8*  MG  --  2.0   Liver Function Tests: No results for input(s): AST, ALT, ALKPHOS, BILITOT, PROT, ALBUMIN in the last 168 hours. No results for input(s): LIPASE, AMYLASE in the last 168 hours. No results for input(s): AMMONIA in the last 168 hours. CBC: Recent Labs  Lab 08/29/20 1141 08/30/20 0551  WBC 7.5 6.2  NEUTROABS 5.5  --   HGB 13.1 12.1  HCT 38.9 36.2  MCV 90.9 93.1  PLT 280 235   Cardiac Enzymes: No results for  input(s): CKTOTAL, CKMB, CKMBINDEX, TROPONINI in the last 168 hours. BNP: Invalid input(s): POCBNP CBG: No results for input(s): GLUCAP in the last 168 hours. D-Dimer No results for input(s): DDIMER in the last 72 hours. Hgb A1c No results for input(s): HGBA1C in the last 72 hours. Lipid Profile Recent Labs    08/30/20 0551  CHOL 151  HDL 43  LDLCALC 85  TRIG 115  CHOLHDL 3.5   Thyroid function studies Recent Labs    08/29/20 1141  TSH 2.810   Anemia work up Recent Labs    08/29/20 1855  VITAMINB12 406   Urinalysis    Component Value Date/Time   BILIRUBINUR negative 01/23/2016 1604   PROTEINUR trace 01/23/2016 1604   UROBILINOGEN 0.2 01/23/2016 1604   NITRITE negative 01/23/2016 1604   LEUKOCYTESUR moderate (2+) (A) 01/23/2016 1604   Sepsis Labs Invalid input(s): PROCALCITONIN,  WBC,  LACTICIDVEN Microbiology Recent Results (from the past 240 hour(s))  SARS CORONAVIRUS 2 (TAT 6-24 HRS) Nasopharyngeal Nasopharyngeal Swab     Status: None   Collection Time: 08/29/20  7:00 PM   Specimen: Nasopharyngeal Swab  Result Value Ref Range Status   SARS Coronavirus 2 NEGATIVE NEGATIVE Final    Comment: (NOTE) SARS-CoV-2 target nucleic acids are NOT DETECTED.  The SARS-CoV-2 RNA is generally detectable in upper and lower respiratory specimens during the acute phase of infection. Negative results do not preclude SARS-CoV-2 infection, do not rule out co-infections with other pathogens, and should not be used as the sole basis for treatment or other patient management decisions. Negative results must be combined with clinical observations, patient history, and epidemiological information. The expected result is Negative.  Fact Sheet for Patients: SugarRoll.be  Fact Sheet for Healthcare Providers: https://www.woods-mathews.com/  This test is not yet approved or cleared by the [place] FDA and  has been authorized for  detection and/or diagnosis of SARS-CoV-2 by FDA under an Emergency Use Authorization (EUA). This EUA will remain  in effect (meaning this test can be used) for the duration of the COVID-19 declaration under Se ction 564(b)(1) of the Act, 21 U.S.C. section 360bbb-3(b)(1), unless the authorization is terminated or revoked sooner.  Performed at [facility], [place] [address] [place], [place] [address]      Time coordinating discharge: Over 30 minutes  SIGNED:   Wyvonnia Dusky, MD  [facility] 08/30/2020, 4:07  PM Pager   If 7PM-7AM, please contact night-coverage [email or web address]

## 2020-08-30 NOTE — Discharge Instructions (Signed)
You take aspirin and plavix for 21 days and then just aspirin thereafter

## 2020-08-30 NOTE — Progress Notes (Signed)
SLP Cancellation Note  Patient Details Name: Delisha S Koepp MRN: [id number] DOB: 04/28/1950   Cancelled treatment:       Reason Eval/Treat Not Completed: SLP screened, no needs identified, will sign off (chart reviewed; consulted NSG then met w/ pt/family in room). Pt denied any difficulty swallowing and is currently on a regular diet; tolerates swallowing pills w/ water per NSG. Pt conversed in casual conversation re: family, [employer]/colleges, and vacation w/out expressive/receptive deficits noted; pt denied any speech-language deficits. Speech clear. No further skilled ST services indicated as pt appears at her baseline. Pt agreed. NSG to reconsult if any change in status while admitted.       Katherine Watson, MS, CCC-SLP Speech Language Pathologist Re[facility] [phone] Watson,Katherine 08/30/2020, 10:09 AM 

## 2020-08-31 LAB — HEMOGLOBIN A1C
Hgb A1c MFr Bld: 5.9 % — ABNORMAL HIGH (ref 4.8–5.6)
Mean Plasma Glucose: 122.63 mg/dL

## 2020-09-01 ENCOUNTER — Telehealth: Payer: Self-pay | Admitting: Physician Assistant

## 2020-09-01 ENCOUNTER — Ambulatory Visit (INDEPENDENT_AMBULATORY_CARE_PROVIDER_SITE_OTHER): Payer: Medicare Other

## 2020-09-01 DIAGNOSIS — I639 Cerebral infarction, unspecified: Secondary | ICD-10-CM

## 2020-09-01 NOTE — Telephone Encounter (Signed)
Scheduled 6 week fu .  Confirmed patient address on file is correct and is aware monitor will be mailed to her .

## 2020-09-01 NOTE — Telephone Encounter (Signed)
Order for 14 day zio XT placed. Home enrollment completed (To be mailed to the patient).

## 2020-09-01 NOTE — Telephone Encounter (Signed)
Visser, Jacquelyn D, PA-C  P Cv [facility] Triage; P [facility] Zio XT request (per neuro) for Dr. Rockey Situ patient to rule out Afib.   History of recent stroke.   Can we  (1) Mail her a Zio XT x2 weeks  (2) Schedule follow-up with Dr. Rockey Situ or APP after completion to review results?   Thank you!  JV

## 2020-09-01 NOTE — Telephone Encounter (Signed)
-----   Message from Arvil Chaco, PA-C sent at 08/30/2020 11:39 AM EDT ----- Regarding: ZIO XT x2 weeks [facility] Zio XT request (per neuro) for Dr. Rockey Situ patient to rule out Afib.  History of recent stroke.   Can we  (1) Mail her a Zio XT x2 weeks (2) Schedule follow-up with Dr. Rockey Situ or APP after completion to review results?  Thank you! JV

## 2020-09-02 ENCOUNTER — Other Ambulatory Visit: Payer: Self-pay

## 2020-09-02 DIAGNOSIS — L719 Rosacea, unspecified: Secondary | ICD-10-CM

## 2020-09-02 MED ORDER — AZELAIC ACID 15 % EX GEL: CUTANEOUS | 5 refills | Status: DC

## 2020-09-03 ENCOUNTER — Other Ambulatory Visit: Payer: Self-pay | Admitting: Family Medicine

## 2020-09-03 ENCOUNTER — Encounter: Payer: Self-pay | Admitting: Family Medicine

## 2020-09-03 ENCOUNTER — Ambulatory Visit (INDEPENDENT_AMBULATORY_CARE_PROVIDER_SITE_OTHER): Payer: Medicare Other | Admitting: Family Medicine

## 2020-09-03 ENCOUNTER — Other Ambulatory Visit: Payer: Self-pay

## 2020-09-03 VITALS — BP 154/79 | HR 60 | Temp 98.9°F | Resp 16 | Wt 216.0 lb

## 2020-09-03 DIAGNOSIS — I10 Essential (primary) hypertension: Secondary | ICD-10-CM | POA: Diagnosis not present

## 2020-09-03 DIAGNOSIS — I63522 Cerebral infarction due to unspecified occlusion or stenosis of left anterior cerebral artery: Secondary | ICD-10-CM

## 2020-09-03 DIAGNOSIS — R7303 Prediabetes: Secondary | ICD-10-CM | POA: Diagnosis not present

## 2020-09-03 DIAGNOSIS — E781 Pure hyperglyceridemia: Secondary | ICD-10-CM

## 2020-09-03 DIAGNOSIS — E7800 Pure hypercholesterolemia, unspecified: Secondary | ICD-10-CM

## 2020-09-03 LAB — HIV-1/2 AB - DIFFERENTIATION
HIV 1 Ab: NONREACTIVE
HIV 2 Ab: NONREACTIVE
Note: NEGATIVE

## 2020-09-03 LAB — HIV-1/HIV-2 QUALITATIVE RNA
Final Interpretation: NEGATIVE
HIV-1 RNA, Qualitative: NONREACTIVE
HIV-2 RNA, Qualitative: NONREACTIVE

## 2020-09-03 MED ORDER — AMLODIPINE BESYLATE 5 MG PO TABS: 5.0000 mg | ORAL_TABLET | Freq: Every day | ORAL | 3 refills | Status: DC

## 2020-09-03 MED ORDER — ROSUVASTATIN CALCIUM 40 MG PO TABS: 40.0000 mg | ORAL_TABLET | Freq: Every day | ORAL | 3 refills | Status: DC

## 2020-09-03 NOTE — Progress Notes (Signed)
Established patient visit   Patient: Sharon Small   DOB: [birth year]   70 y.o. Female  MRN: [id number] Visit Date: 09/03/2020  Today's healthcare provider: Wilhemena Durie, MD   Chief Complaint  Patient presents with   Hospitalization Follow-up   Subjective    HPI  Patient feels fairly well and is doing well with retirement.  She would like to have blood work done as she gets free blood work through [employer] for another few months. She also had recent small CVA with no sequela but needs all risk factors treated and I think needs appropriate referral to neurology for follow-up.  Hospital notes are reviewed with her and are enclosed . Follow up Hospitalization  Patient was admitted to [facility] on 08/29/2020 and discharged on 09/01/2020. She was treated for DVT and CVA. Treatment for this included starting on plavix 75mg  daily for 21 days.  She reports good compliance with treatment. She reports this condition is improved.  \   Medications: Outpatient Medications Prior to Visit  Medication Sig   aspirin 81 MG tablet Take 1 tablet by mouth daily.   Azelaic Acid 15 % gel Apply a small amount to skin 1-2 times daily as needed.   benazepril (LOTENSIN) 40 MG tablet TAKE 1 TABLET(40 MG) BY MOUTH DAILY   Biotin 10 MG CAPS Take by mouth.   bisoprolol-hydrochlorothiazide (ZIAC) 10-6.25 MG tablet TAKE 1 TABLET BY MOUTH DAILY   Cholecalciferol (VITAMIN D) 2000 units CAPS Take by mouth daily.   clopidogrel (PLAVIX) 75 MG tablet Take 1 tablet (75 mg total) by mouth daily for 21 days.   Magnesium 250 MG TABS Take 250 mg by mouth daily.   Misc Natural Products (GLUCOSAMINE CHONDROITIN COMPLX) TABS Take 1 tablet by mouth in the morning and at bedtime.   Multiple Vitamin (MULTIVITAMIN) tablet Take 1 tablet by mouth daily.   omeprazole (PRILOSEC) 20 MG capsule Take 20 mg by mouth daily.   simvastatin (ZOCOR) 20 MG tablet Take 1 tablet (20 mg total) by mouth at bedtime.   white petrolatum  (VASELINE) GEL Apply 1 application topically as needed for lip care. For dry lips   chlorpheniramine-HYDROcodone (TUSSIONEX PENNKINETIC ER) 10-8 MG/5ML SUER Take 5 mLs by mouth every 12 (twelve) hours as needed for cough. (Patient not taking: No sig reported)   Magnesium 65 MG TABS Take 1 tablet by mouth daily.   No facility-administered medications prior to visit.    Review of Systems  Constitutional:  Negative for activity change and fatigue.  Respiratory:  Negative for cough and shortness of breath.   Cardiovascular:  Negative for chest pain, palpitations and leg swelling.  Neurological:  Negative for dizziness and headaches.       Objective    BP (!) 154/79   Pulse 60   Temp 98.9 F (37.2 C)   Resp 16   Wt 216 lb (98 kg)   BMI 35.40 kg/m  BP Readings from Last 3 Encounters:  09/03/20 (!) 154/79  08/30/20 (!) 167/89  08/21/20 (!) 145/79   Wt Readings from Last 3 Encounters:  09/03/20 216 lb (98 kg)  08/29/20 217 lb (98.4 kg)  08/21/20 217 lb (98.4 kg)       Physical Exam Vitals reviewed.  HENT:     Head: Normocephalic and atraumatic.     Right Ear: External ear normal.     Left Ear: External ear normal.     Nose: Nose normal.  Mouth/Throat:     Pharynx: Oropharynx is clear.  Eyes:     General: No scleral icterus.    Conjunctiva/sclera: Conjunctivae normal.  Cardiovascular:     Rate and Rhythm: Normal rate and regular rhythm.     Pulses: Normal pulses.     Heart sounds: Normal heart sounds.  Pulmonary:     Effort: Pulmonary effort is normal.     Breath sounds: Normal breath sounds.  Abdominal:     Palpations: Abdomen is soft.  Musculoskeletal:     Right lower leg: No edema.     Left lower leg: No edema.  Skin:    General: Skin is warm and dry.     Comments: Mild vitiligo noted  Neurological:     General: No focal deficit present.     Mental Status: She is alert and oriented to person, place, and time. Mental status is at baseline.  Psychiatric:         Mood and Affect: Mood normal.        Behavior: Behavior normal.        Thought Content: Thought content normal.        Judgment: Judgment normal.      No results found for any visits on 09/03/20.  Assessment & Plan     1. Acute ischemic left ACA stroke (HCC) no sequelae.  Work-up is been complete but she has not seen cardiology or neurology in follow-up.  Also think she needs a loop monitor as atrial fibs is a concern that I have for the patient.  She has never had symptoms of been found to be in A. fib. - Ambulatory referral to Neurology  2. Pure hypercholesterolemia Treat more aggressively with recent CVA.  Change Zocor to Crestor 40 - rosuvastatin (CRESTOR) 40 MG tablet; Take 1 tablet (40 mg total) by mouth daily.  Dispense: 90 tablet; Refill: 3  3. Essential (primary) hypertension Blood pressure up so we will start amlodipine 5 mg daily.  Follow-up 1 to 2 months - amLODipine (NORVASC) 5 MG tablet; Take 1 tablet (5 mg total) by mouth daily.  Dispense: 90 tablet; Refill: 3  4. Borderline diabetes Follow-up A1c when appropriate     No follow-ups on file.      I, Wilhemena Durie, MD, have reviewed all documentation for this visit. The documentation on 09/08/20 for the exam, diagnosis, procedures, and orders are all accurate and complete.    Cristyn Crossno Cranford Mon, MD  [facility] [phone] (phone) [phone] (fax)  Zap

## 2020-09-04 ENCOUNTER — Encounter: Payer: Self-pay | Admitting: Family Medicine

## 2020-09-05 DIAGNOSIS — I639 Cerebral infarction, unspecified: Secondary | ICD-10-CM | POA: Diagnosis not present

## 2020-09-05 NOTE — Progress Notes (Signed)
positive HIV 4th gen test-08/30/20 Negative HiV 1 Ab/HIV 2 ab resulted on 09/03/20 This is a false positive test So no further action

## 2020-09-08 ENCOUNTER — Encounter: Payer: Self-pay | Admitting: Family Medicine

## 2020-09-08 ENCOUNTER — Inpatient Hospital Stay: Payer: Medicare Other | Admitting: Family Medicine

## 2020-09-10 ENCOUNTER — Ambulatory Visit: Payer: Medicare Other | Admitting: Neurology

## 2020-09-10 ENCOUNTER — Other Ambulatory Visit: Payer: Self-pay

## 2020-09-10 ENCOUNTER — Encounter: Payer: Self-pay | Admitting: Neurology

## 2020-09-10 VITALS — BP 134/86 | HR 61 | Ht 65.5 in | Wt 218.0 lb

## 2020-09-10 DIAGNOSIS — I639 Cerebral infarction, unspecified: Secondary | ICD-10-CM

## 2020-09-10 DIAGNOSIS — I63522 Cerebral infarction due to unspecified occlusion or stenosis of left anterior cerebral artery: Secondary | ICD-10-CM | POA: Diagnosis not present

## 2020-09-10 NOTE — Patient Instructions (Signed)
I had a long d/w patient about his recent stroke, risk for recurrent stroke/TIAs, personally independently reviewed imaging studies and stroke evaluation results and answered questions.Continue aspirin 81 mg daily and clopidogrel 75 mg daily  for  total of 3 weeks post stroke and then aspirin alone secondary stroke prevention and maintain strict control of hypertension with blood pressure goal below 130/90, diabetes with hemoglobin A1c goal below 6.5% and lipids with LDL cholesterol goal below 70 mg/dL. I also advised the patient to eat a healthy diet with plenty of whole grains, cereals, fruits and vegetables, exercise regularly and maintain ideal body weight .consider possible participation in the [place] trial for stroke prevention for cryptogenic stroke if interested.  Follow results of 14-day external heart monitor and if negative I recommend loop recorder insertion to look for paroxysmal A. fib.  Followup in the future with me in 6 months or call earlier if necessary. Stroke Prevention Some medical conditions and behaviors are associated with a higher chance of having a stroke. You can help prevent a stroke by making nutrition, lifestyle,and other changes, including managing any medical conditions you may have. What nutrition changes can be made?  Eat healthy foods. You can do this by: Choosing foods high in fiber, such as fresh fruits and vegetables and whole grains. Eating at least 5 or more servings of fruits and vegetables a day. Try to fill half of your plate at each meal with fruits and vegetables. Choosing lean protein foods, such as lean cuts of meat, poultry without skin, fish, tofu, beans, and nuts. Eating low-fat dairy products. Avoiding foods that are high in salt (sodium). This can help lower blood pressure. Avoiding foods that have saturated fat, trans fat, and cholesterol. This can help prevent high cholesterol. Avoiding processed and premade foods. Follow your health care  provider's specific guidelines for losing weight, controlling high blood pressure (hypertension), lowering high cholesterol, and managing diabetes. These may include: Reducing your daily calorie intake. Limiting your daily sodium intake to 1,500 milligrams (mg). Using only healthy fats for cooking, such as olive oil, canola oil, or sunflower oil. Counting your daily carbohydrate intake. What lifestyle changes can be made? Maintain a healthy weight. Talk to your health care provider about your ideal weight. Get at least 30 minutes of moderate physical activity at least 5 days a week. Moderate activity includes brisk walking, biking, and swimming. Do not use any products that contain nicotine or tobacco, such as cigarettes and e-cigarettes. If you need help quitting, ask your health care provider. It may also be helpful to avoid exposure to secondhand smoke. Limit alcohol intake to no more than 1 drink a day for nonpregnant women and 2 drinks a day for men. One drink equals 12 oz of beer, 5 oz of wine, or 1 oz of hard liquor. Stop any illegal drug use. Avoid taking birth control pills. Talk to your health care provider about the risks of taking birth control pills if: You are over 26 years old. You smoke. You get migraines. You have ever had a blood clot. What other changes can be made? Manage your cholesterol levels. Eating a healthy diet is important for preventing high cholesterol. If cholesterol cannot be managed through diet alone, you may also need to take medicines. Take any prescribed medicines to control your cholesterol as told by your health care provider. Manage your diabetes. Eating a healthy diet and exercising regularly are important parts of managing your blood sugar. If your blood sugar cannot be  managed through diet and exercise, you may need to take medicines. Take any prescribed medicines to control your diabetes as told by your health care provider. Control your  hypertension. To reduce your risk of stroke, try to keep your blood pressure below 130/80. Eating a healthy diet and exercising regularly are an important part of controlling your blood pressure. If your blood pressure cannot be managed through diet and exercise, you may need to take medicines. Take any prescribed medicines to control hypertension as told by your health care provider. Ask your health care provider if you should monitor your blood pressure at home. Have your blood pressure checked every year, even if your blood pressure is normal. Blood pressure increases with age and some medical conditions. Get evaluated for sleep disorders (sleep apnea). Talk to your health care provider about getting a sleep evaluation if you snore a lot or have excessive sleepiness. Take over-the-counter and prescription medicines only as told by your health care provider. Aspirin or blood thinners (antiplatelets or anticoagulants) may be recommended to reduce your risk of forming blood clots that can lead to stroke. Make sure that any other medical conditions you have, such as atrial fibrillation or atherosclerosis, are managed. What are the warning signs of a stroke? The warning signs of a stroke can be easily remembered as BEFAST. B is for balance. Signs include: Dizziness. Loss of balance or coordination. Sudden trouble walking. E is for eyes. Signs include: A sudden change in vision. Trouble seeing. F is for face. Signs include: Sudden weakness or numbness of the face. The face or eyelid drooping to one side. A is for arms. Signs include: Sudden weakness or numbness of the arm, usually on one side of the body. S is for speech. Signs include: Trouble speaking (aphasia). Trouble understanding. T is for time. These symptoms may represent a serious problem that is an emergency. Do not wait to see if the symptoms will go away. Get medical help right away. Call your local emergency services (911 in the  [place] Do not drive yourself to the hospital. Other signs of stroke may include: A sudden, severe headache with no known cause. Nausea or vomiting. Seizure. Where to find more information For more information, visit: American Stroke Association: www.strokeassociation.org National Stroke Association: www.stroke.org Summary You can prevent a stroke by eating healthy, exercising, not smoking, limiting alcohol intake, and managing any medical conditions you may have. Do not use any products that contain nicotine or tobacco, such as cigarettes and e-cigarettes. If you need help quitting, ask your health care provider. It may also be helpful to avoid exposure to secondhand smoke. Remember BEFAST for warning signs of stroke. Get help right away if you or a loved one has any of these signs. This information is not intended to replace advice given to you by your health care provider. Make sure you discuss any questions you have with your healthcare provider. Document Revised: 01/14/2017 Document Reviewed: 03/09/2016 Elsevier Patient Education  2021 [employer].

## 2020-09-10 NOTE — Progress Notes (Signed)
[facility] [address] [place]. [place] [address] [phone]       OFFICE CONSULT NOTE  Ms. Sharon Small Date of Birth:  04/29/1950 Medical Record Number:  [id number]   Referring MD: Miguel Aschoff  Reason for Referral: Stroke  HPI: Sharon Small is a 70 year old Caucasian lady seen today for initial office consultation visit for stroke.  History is obtained from the patient and her husband, review of electronic medical records and I personally reviewed pertinent imaging films in PACS.  She has past medical history of hypertension and possible sleep apnea who presented to [facility] on [facility]/30/[facility] with complaints of waking up with right leg weakness and difficulty walking and feeling off balance.  Symptoms started improving after she came to the ER on exam she had NIH stroke scale of 1 with mild right leg drift.  CT scan of the head was unremarkable but MRI scan showed patchy left ACA infarct without hemorrhage.  CT angiogram of brain and neck showed no significant large vessel stenosis or occlusion.  2D echo showed ejection fraction of 55 to 60% without cardiac source of embolism.  Telemetry monitoring in the hospital did not show any paroxysmal A. fib.  LDL cholesterol was 85 mg percent and hemoglobin A1c was 5.9.  She was started on dual antiplatelet therapy aspirin and Plavix which she is tolerating well with minor bruising but no bleeding.  She also is wearing currently in external heart monitor for 2 weeks.  Patient states she is made full recovery though she feels her balance is still slightly off and she has to be careful with walking.  She has no prior history of strokes, TIAs or significant neurological problems.  She denies history of palpitations, syncopal episodes or heart problems.  She has no new complaints today.  Her blood pressure is well controlled today it is 134/86.  She is tolerating Crestor well without muscle aches and  pains.  ROS:   14 system review of systems is positive for leg weakness, walking difficulty only all other systems negative  PMH:  Past Medical History:  Diagnosis Date   GERD (gastroesophageal reflux disease)    Hypertension    Snores     Social History:  Social History   Socioeconomic History   Marital status: Married    Spouse name: Not on file   Number of children: Not on file   Years of education: Not on file   Highest education level: Not on file  Occupational History   Not on file  Tobacco Use   Smoking status: Never   Smokeless tobacco: Never  Vaping Use   Vaping Use: Never used  Substance and Sexual Activity   Alcohol use: No   Drug use: No   Sexual activity: Yes  Other Topics Concern   Not on file  Social History Narrative   Not on file   Social Determinants of Health   Financial Resource Strain: Not on file  Food Insecurity: Not on file  Transportation Needs: Not on file  Physical Activity: Not on file  Stress: Not on file  Social Connections: Not on file  Intimate Partner Violence: Not on file    Medications:   Current Outpatient Medications on File Prior to Visit  Medication Sig Dispense Refill   amLODipine (NORVASC) 5 MG tablet Take 1 tablet (5 mg total) by mouth daily. 90 tablet 3   aspirin 81 MG tablet Take 1 tablet by mouth daily.  Azelaic Acid 15 % gel Apply a small amount to skin 1-2 times daily as needed. 30 g 5   benazepril (LOTENSIN) 40 MG tablet TAKE 1 TABLET(40 MG) BY MOUTH DAILY 90 tablet 0   bisoprolol-hydrochlorothiazide (ZIAC) 10-6.25 MG tablet TAKE 1 TABLET BY MOUTH DAILY 90 tablet 3   chlorpheniramine-HYDROcodone (TUSSIONEX PENNKINETIC ER) 10-8 MG/5ML SUER Take 5 mLs by mouth every 12 (twelve) hours as needed for cough. (Patient not taking: No sig reported) 140 mL 0   clopidogrel (PLAVIX) 75 MG tablet Take 1 tablet (75 mg total) by mouth daily for 21 days. 21 tablet 0   omeprazole (PRILOSEC) 20 MG capsule Take 20 mg by mouth  daily.     rosuvastatin (CRESTOR) 40 MG tablet Take 1 tablet (40 mg total) by mouth daily. 90 tablet 3   white petrolatum (VASELINE) GEL Apply 1 application topically as needed for lip care. For dry lips     No current facility-administered medications on file prior to visit.    Allergies:  No Known Allergies  Physical Exam General: Pleasant mildly obese elderly Caucasian lady, seated, in no evident distress Head: head normocephalic and atraumatic.   Neck: supple with no carotid or supraclavicular bruits Cardiovascular: regular rate and rhythm, no murmurs Musculoskeletal: no deformity Skin:  no rash/petichiae Vascular:  Normal pulses all extremities  Neurologic Exam Mental Status: Awake and fully alert. Oriented to place and time. Recent and remote memory intact. Attention span, concentration and fund of knowledge appropriate. Mood and affect appropriate.  Cranial Nerves: Fundoscopic exam reveals sharp disc margins. Pupils equal, briskly reactive to light. Extraocular movements full without nystagmus. Visual fields full to confrontation. Hearing intact. Facial sensation intact. Face, tongue, palate moves normally and symmetrically.  Motor: Normal bulk and tone. Normal strength in all tested extremity muscles. Sensory.: intact to touch , pinprick , position and vibratory sensation.  Coordination: Rapid alternating movements normal in all extremities. Finger-to-nose and heel-to-shin performed accurately bilaterally. Gait and Station: Arises from chair without difficulty. Stance is normal. Gait demonstrates normal stride length and balance . Able to heel, toe and tandem walk with moderate difficulty.  Reflexes: 1+ and symmetric. Toes downgoing.   NIHSS  0 Modified Rankin  1   ASSESSMENT: 70 year old pleasant Caucasian lady with right leg weakness secondary to embolic left anterior cerebral artery infarct in July 2022 of cryptogenic etiology.  Vascular risk factors of hypertension, mild  obesity and mild hyperlipidemia only     PLAN:I had a long d/w patient about his recent stroke, risk for recurrent stroke/TIAs, personally independently reviewed imaging studies and stroke evaluation results and answered questions.Continue aspirin 81 mg daily and clopidogrel 75 mg daily  for  total of 3 weeks post stroke and then aspirin alone secondary stroke prevention and maintain strict control of hypertension with blood pressure goal below 130/90, diabetes with hemoglobin A1c goal below 6.5% and lipids with LDL cholesterol goal below 70 mg/dL. I also advised the patient to eat a healthy diet with plenty of whole grains, cereals, fruits and vegetables, exercise regularly and maintain ideal body weight .consider possible participation in the [place] trial for stroke prevention for cryptogenic stroke if interested.  Follow results of 14-day external heart monitor and if negative I recommend loop recorder insertion to look for paroxysmal A. fib.  Followup in the future with me in 6 months or call earlier if necessary.  Greater than 50% time during this 45-minute consultation visit was spent on counseling and coordination of care about her  cryptogenic stroke and discussion about evaluation and treatment and answering questions. Antony Contras, MD Note: This document was prepared with digital dictation and possible smart phrase technology. Any transcriptional errors that result from this process are unintentional.

## 2020-09-15 ENCOUNTER — Other Ambulatory Visit: Payer: Self-pay | Admitting: Neurology

## 2020-09-15 ENCOUNTER — Telehealth: Payer: Self-pay | Admitting: Cardiovascular Disease

## 2020-09-15 MED ORDER — CLOPIDOGREL BISULFATE 75 MG PO TABS: 75.0000 mg | ORAL_TABLET | Freq: Every day | ORAL | 3 refills | Status: AC

## 2020-09-15 NOTE — Telephone Encounter (Signed)
Patient calling to make office aware that she had a malfunction with heart monitor and had to send in early but [employer] will translate what they received

## 2020-09-17 DIAGNOSIS — I471 Supraventricular tachycardia: Secondary | ICD-10-CM | POA: Diagnosis not present

## 2020-09-17 DIAGNOSIS — I639 Cerebral infarction, unspecified: Secondary | ICD-10-CM | POA: Diagnosis not present

## 2020-09-17 DIAGNOSIS — I472 Ventricular tachycardia: Secondary | ICD-10-CM | POA: Diagnosis not present

## 2020-09-26 ENCOUNTER — Telehealth: Payer: Self-pay | Admitting: *Deleted

## 2020-09-26 NOTE — Telephone Encounter (Signed)
Attempted to call pt. No answer. Lmtcb.  

## 2020-09-26 NOTE — Telephone Encounter (Signed)
-----   Message from Arvil Chaco, PA-C sent at 09/25/2020  3:24 PM EDT ----- Event monitor ordered at time of discharge to rule out atrial fibrillation.  Monitor worn 8d, 13h.   Predominant rhythm sinus rhythm or a normal heart rhythm with average heart rate 58 bpm.  1 brief run of a faster heart rhythm from the bottom chambers of the heart occurred, called nonsustained ventricular tachycardia, and with a maximum rate of 184 bpm. 36 runs of a faster heart rhythm from the top chambers of the heart occurred, called supraventricular tachycardia, and the longest of which lasted 11.3 seconds. Isolated extra beats from the top and bottom chambers occurred and included PACs and PVCs.  Event monitor to be discussed with patient at her follow-up visit with Dr. Rockey Situ.

## 2020-09-29 NOTE — Telephone Encounter (Signed)
Patient made aware of monitor results with verbalized understanding.

## 2020-10-08 DIAGNOSIS — H40003 Preglaucoma, unspecified, bilateral: Secondary | ICD-10-CM | POA: Diagnosis not present

## 2020-10-17 ENCOUNTER — Other Ambulatory Visit: Payer: Self-pay

## 2020-10-17 ENCOUNTER — Ambulatory Visit: Payer: Medicare Other | Admitting: Cardiovascular Disease

## 2020-10-17 ENCOUNTER — Encounter: Payer: Self-pay | Admitting: Cardiovascular Disease

## 2020-10-17 VITALS — BP 126/82 | HR 60 | Ht 65.5 in | Wt 208.1 lb

## 2020-10-17 DIAGNOSIS — I10 Essential (primary) hypertension: Secondary | ICD-10-CM | POA: Diagnosis not present

## 2020-10-17 NOTE — Progress Notes (Signed)
Cardiology Office Note  Date:  10/17/2020   ID:  Sharon Small, DOB 01/25/1951, MRN [id number]  PCP:  Jerrol Banana., MD   Chief Complaint  Patient presents with   New Patient (Initial Visit)    [facility] CVA; August 29, 2020. Patient c/o fluttering/palpiations in chest at times. Medications reviewed by the patient verbally.     HPI:  70 year old female with strong family history of  coronary artery disease,  hypertension,  hyperlipidemia, PAD/mild carotid disease on the left She presents for new patient evaluation after recent CVA  Last seen in clinic by myself 2017  Recent diagnosis of stroke, leg weakness on right admitted to [facility] on 08/29/2020 and discharged on 09/01/2020. She was treated for CVA.  Records reviewed in detail Presenting with right leg weakness and difficulty walking and feeling off balance.    CT scan of the head was unremarkable  MRI scan showed patchy left ACA infarct without hemorrhage.   CT angiogram of brain and neck showed no significant large vessel stenosis or occlusion.   2D echo showed ejection fraction of 55 to 60% without cardiac source of embolism.    Telemetry monitoring in the hospital did not show any paroxysmal A. fib.    LDL cholesterol was 85 mg percent and hemoglobin A1c was 5.9.    She was started on dual antiplatelet therapy aspirin and Plavix  Sx resolved that afternoon She has since stopped the aspirin, maintained on Plavix Has been evaluated by neurology, recommendation for a ILD/loop   Outpatient placement of ZIO monitor Patient had a min HR of 42 bpm, max HR of 184 bpm, and avg HR of 58 bpm.  Predominant underlying rhythm was Sinus Rhythm.    1 run of Ventricular Tachycardia occurred lasting 5 beats with a max rate of 184 bpm (avg 140 bpm).    36 Supraventricular Tachycardia  runs occurred, the run with the fastest interval lasting 4 beats with a max rate of 156 bpm, the longest lasting 11.3 secs with an avg rate of 121  bpm.  Isolated SVEs were rare (<1.0%), SVE Couplets were rare (<1.0%), and SVE Triplets were rare (<1.0%).    Isolated VEs were rare (<1.0%), and no VE Couplets or VE Triplets were present.  No patient triggered events recorded  Echo reviewed  1. Left ventricular ejection fraction, by estimation, is 55 to 60%. The  left ventricle has normal function. The left ventricle has no regional  wall motion abnormalities. There is mild concentric left ventricular  hypertrophy. Left ventricular diastolic  parameters were normal.   2. Right ventricular systolic function is normal. The right ventricular  size is normal.   3. The mitral valve is normal in structure. No evidence of mitral valve  regurgitation. No evidence of mitral stenosis.   4. The aortic valve is normal in structure. There is mild calcification  of the aortic valve. Aortic valve regurgitation is not visualized. No  aortic stenosis is present.   5. The inferior vena cava is normal in size with greater than 50%  respiratory variability, suggesting right atrial pressure of 3 mmHg.    Having low pressures 123456 systolic at home Takes all of her blood pressure medications in the morning Tolerating Crestor 40 daily Recent weight loss 10 pounds  EKG personally reviewed by myself on todays visit Normal sinus rhythm rate 60 bpm no significant ST-T wave changes  Dad was smoker, died 21  PMH:   has a past medical  history of CVA (cerebral vascular accident) ([place]), GERD (gastroesophageal reflux disease), Hypertension, and Snores.  PSH:    Past Surgical History:  Procedure Laterality Date   CATARACT EXTRACTION Bilateral 2003 & 2005   COLONOSCOPY     COLONOSCOPY WITH PROPOFOL N/A 07/04/2015   Procedure: COLONOSCOPY WITH PROPOFOL;  Surgeon: Lucilla Lame, MD;  Location: [place];  Service: Endoscopy;  Laterality: N/A;  prefers early   VAGINAL HYSTERECTOMY  2005   Bilateral oophorectomy    Current Outpatient Medications   Medication Sig Dispense Refill   amLODipine (NORVASC) 5 MG tablet Take 1 tablet (5 mg total) by mouth daily. 90 tablet 3   Azelaic Acid 15 % gel Apply a small amount to skin 1-2 times daily as needed. 30 g 5   benazepril (LOTENSIN) 40 MG tablet TAKE 1 TABLET(40 MG) BY MOUTH DAILY 90 tablet 0   bisoprolol-hydrochlorothiazide (ZIAC) 10-6.25 MG tablet TAKE 1 TABLET BY MOUTH DAILY 90 tablet 3   clopidogrel (PLAVIX) 75 MG tablet Take 75 mg by mouth daily.     omeprazole (PRILOSEC) 20 MG capsule Take 20 mg by mouth daily.     rosuvastatin (CRESTOR) 40 MG tablet Take 1 tablet (40 mg total) by mouth daily. 90 tablet 3   white petrolatum (VASELINE) GEL Apply 1 application topically as needed for lip care. For dry lips     chlorpheniramine-HYDROcodone (TUSSIONEX PENNKINETIC ER) 10-8 MG/5ML SUER Take 5 mLs by mouth every 12 (twelve) hours as needed for cough. (Patient not taking: Reported on 10/17/2020) 140 mL 0   No current facility-administered medications for this visit.    Allergies:   Patient has no known allergies.   Social History:  The patient  reports that she has never smoked. She has never used smokeless tobacco. She reports that she does not drink alcohol and does not use drugs.   Family History:   family history includes Breast cancer (age of onset: 63) in her maternal aunt; COPD in her mother; Glaucoma in her sister; Heart disease in her father; Hypertension in her sister; Osteoporosis in her mother; Peripheral Artery Disease in her mother.    Review of Systems: Review of Systems  Constitutional: Negative.   Respiratory: Negative.    Cardiovascular: Negative.   Gastrointestinal: Negative.   Musculoskeletal: Negative.   Neurological: Negative.   Psychiatric/Behavioral: Negative.    All other systems reviewed and are negative.   PHYSICAL EXAM: VS:  BP 126/82 (BP Location: Right Arm, Patient Position: Sitting, Cuff Size: Normal)   Pulse 60   Ht 5' 5.5" (1.664 m)   Wt 208 lb 2 oz  (94.4 kg)   SpO2 98%   BMI 34.11 kg/m  , BMI Body mass index is 34.11 kg/m. Constitutional:  oriented to person, place, and time. No distress.  HENT:  Head: Grossly normal Eyes:  no discharge. No scleral icterus.  Neck: No JVD, no carotid bruits  Cardiovascular: Regular rate and rhythm, no murmurs appreciated Pulmonary/Chest: Clear to auscultation bilaterally, no wheezes or rails Abdominal: Soft.  no distension.  no tenderness.  Musculoskeletal: Normal range of motion Neurological:  normal muscle tone. Coordination normal. No atrophy Skin: Skin warm and dry Psychiatric: normal affect, pleasant  Recent Labs: 01/08/2020: ALT 21 08/29/2020: TSH 2.810 08/30/2020: BUN 18; Creatinine, Ser 0.73; Hemoglobin 12.1; Magnesium 2.0; Platelets 235; Potassium 4.2; Sodium 140    Lipid Panel Lab Results  Component Value Date   CHOL 151 08/30/2020   HDL 43 08/30/2020   LDLCALC 85 08/30/2020  TRIG 115 08/30/2020      Wt Readings from Last 3 Encounters:  10/17/20 208 lb 2 oz (94.4 kg)  09/10/20 218 lb (98.9 kg)  09/03/20 216 lb (98 kg)     ASSESSMENT AND PLAN:  Essential (primary) hypertension -  Blood pressure is well controlled on today's visit. No changes made to the medications.  History of stroke Hospital records reviewed Scans reviewed Discussed implantable loop monitor, she is not particularly eager to have this placed Is indicated she would prefer to have a Apple watch for heart rate/rhythm tracking Discussed risk and benefit of the procedure/loop, she will call us if she changes her mind Maintained on Plavix, will continue Crestor 40 mg daily  Pure hypercholesterolemia Continue Crestor 40  PAD/Carotid stenosis Mild disease on the left seen on CT scan images pulled up today Images reviewed with her in detail Goal LDL less than 70    Total encounter time more than 60 minutes  Greater than 50% was spent in counseling and coordination of care with the patient   No  orders of the defined types were placed in this encounter.    Signed, Esmond Plants, M.D., Ph.D. 10/17/2020  [facility] [place], [place]

## 2020-10-17 NOTE — Patient Instructions (Addendum)
Medication Instructions:  No changes  If you need a refill on your cardiac medications before your next appointment, please call your pharmacy.   Lab work: No new labs needed  Testing/Procedures: No new testing needed  Follow-Up: At [facility], you and your health needs are our priority.  As part of our continuing mission to provide you with exceptional heart care, we have created designated Provider Care Teams.  These Care Teams include your primary Cardiologist (physician) and Advanced Practice Providers (APPs -  Physician Assistants and Nurse Practitioners) who all work together to provide you with the care you need, when you need it.  You will need a follow up appointment in 12 months  Providers on your designated Care Team:   Christopher Berge, NP Ryan Dunn, PA-C Jacquelyn Visser, PA-C Cadence Furth, PA-C  COVID-19 Vaccine Information can be found at: [email or web address] For questions related to vaccine distribution or appointments, please email [email or web address] or call [phone].    

## 2020-10-24 NOTE — Progress Notes (Signed)
Established patient visit   Patient: Sharon Small   DOB: 1951/01/24   70 y.o. Female  MRN: [id number] Visit Date: 10/27/2020  Today's healthcare provider: Wilhemena Durie, MD   Chief Complaint  Patient presents with   Hypertension   Subjective    HPI  Home blood pressure readings are running 110s to 120s over 60s to 70s. She now is off of aspirin and on Plavix for her cryptogenic stroke.  She has recovered completely. Hypertension, follow-up  BP Readings from Last 3 Encounters:  10/27/20 112/81  10/17/20 126/82  09/10/20 134/86   Wt Readings from Last 3 Encounters:  10/27/20 205 lb (93 kg)  10/17/20 208 lb 2 oz (94.4 kg)  09/10/20 218 lb (98.9 kg)     She was last seen for hypertension 2 months ago.  BP at that visit was 134/86. Management since that visit includes start amlodipine '5mg'$  daily.  She reports good compliance with treatment. She is not having side effects.  She is following a Regular diet. She is exercising. She does not smoke.  Use of agents associated with hypertension: none.   Outside blood pressures are checked daily. Symptoms: No chest pain No chest pressure  No palpitations No syncope  No dyspnea No orthopnea  No paroxysmal nocturnal dyspnea No lower extremity edema   Pertinent labs: Lab Results  Component Value Date   CHOL 151 08/30/2020   HDL 43 08/30/2020   LDLCALC 85 08/30/2020   TRIG 115 08/30/2020   CHOLHDL 3.5 08/30/2020   Lab Results  Component Value Date   NA 140 08/30/2020   K 4.2 08/30/2020   CREATININE 0.73 08/30/2020   GFRNONAA >60 08/30/2020   GFRAA 102 01/08/2020   GLUCOSE 106 (H) 08/30/2020     The ASCVD Risk score (Arnett DK, et al., 2019) failed to calculate for the following reasons:   The patient has a prior MI or stroke diagnosis       Medications: Outpatient Medications Prior to Visit  Medication Sig   amLODipine (NORVASC) 5 MG tablet Take 1 tablet (5 mg total) by mouth daily.   Azelaic  Acid 15 % gel Apply a small amount to skin 1-2 times daily as needed.   benazepril (LOTENSIN) 40 MG tablet TAKE 1 TABLET(40 MG) BY MOUTH DAILY   bisoprolol-hydrochlorothiazide (ZIAC) 10-6.25 MG tablet TAKE 1 TABLET BY MOUTH DAILY   clopidogrel (PLAVIX) 75 MG tablet Take 75 mg by mouth daily.   omeprazole (PRILOSEC) 20 MG capsule Take 20 mg by mouth daily.   rosuvastatin (CRESTOR) 40 MG tablet Take 1 tablet (40 mg total) by mouth daily.   white petrolatum (VASELINE) GEL Apply 1 application topically as needed for lip care. For dry lips   chlorpheniramine-HYDROcodone (TUSSIONEX PENNKINETIC ER) 10-8 MG/5ML SUER Take 5 mLs by mouth every 12 (twelve) hours as needed for cough. (Patient not taking: No sig reported)   No facility-administered medications prior to visit.    Review of Systems  Constitutional:  Negative for activity change and fatigue.  Respiratory:  Negative for cough and shortness of breath.   Cardiovascular:  Negative for chest pain, palpitations and leg swelling.  Musculoskeletal:  Negative for arthralgias and myalgias.  Neurological:  Negative for dizziness, light-headedness and headaches.  Psychiatric/Behavioral:  Negative for agitation, self-injury, sleep disturbance and suicidal ideas. The patient is not nervous/anxious.        Objective    BP 112/81   Pulse 77   Resp 16  Wt 205 lb (93 kg)   BMI 33.59 kg/m  BP Readings from Last 3 Encounters:  10/27/20 112/81  10/17/20 126/82  09/10/20 134/86   Wt Readings from Last 3 Encounters:  10/27/20 205 lb (93 kg)  10/17/20 208 lb 2 oz (94.4 kg)  09/10/20 218 lb (98.9 kg)      Physical Exam Vitals reviewed.  HENT:     Head: Normocephalic and atraumatic.     Right Ear: External ear normal.     Left Ear: External ear normal.     Nose: Nose normal.     Mouth/Throat:     Pharynx: Oropharynx is clear.  Eyes:     General: No scleral icterus.    Conjunctiva/sclera: Conjunctivae normal.  Cardiovascular:     Rate  and Rhythm: Normal rate and regular rhythm.     Pulses: Normal pulses.     Heart sounds: Normal heart sounds.  Pulmonary:     Effort: Pulmonary effort is normal.     Breath sounds: Normal breath sounds.  Abdominal:     Palpations: Abdomen is soft.  Musculoskeletal:     Right lower leg: No edema.     Left lower leg: No edema.  Skin:    General: Skin is warm and dry.     Comments: Mild vitiligo noted  Neurological:     General: No focal deficit present.     Mental Status: She is alert and oriented to person, place, and time. Mental status is at baseline.  Psychiatric:        Mood and Affect: Mood normal.        Behavior: Behavior normal.        Thought Content: Thought content normal.        Judgment: Judgment normal.      No results found for any visits on 10/27/20.  Assessment & Plan     1. Essential (primary) hypertension  Good control on amlodipine and has a Prill and Ziac. - Comprehensive metabolic panel - CBC with Differential/Platelet  2. Borderline diabetes  - Hemoglobin A1c  3. Pure hypertriglyceridemia  - Lipid panel - TSH  4. Pure hypercholesterolemia On Crestor 40 - Lipid panel  5. B12 deficiency  - B12 and Folate Panel  6. Muscle ache  - Sedimentation rate - C-reactive protein  7. Acute ischemic left ACA stroke (HCC) All risk factors treated.  Off of aspirin and now on Plavix  8. Gastroesophageal reflux disease, unspecified whether esophagitis present On omeprazole daily for symptom control   No follow-ups on file.      I, Wilhemena Durie, MD, have reviewed all documentation for this visit. The documentation on 10/30/20 for the exam, diagnosis, procedures, and orders are all accurate and complete.    Lenni Reckner Cranford Mon, MD  [facility] [phone] (phone) [phone] (fax)  [place]

## 2020-10-27 ENCOUNTER — Other Ambulatory Visit: Payer: Self-pay

## 2020-10-27 ENCOUNTER — Encounter: Payer: Self-pay | Admitting: Family Medicine

## 2020-10-27 ENCOUNTER — Ambulatory Visit (INDEPENDENT_AMBULATORY_CARE_PROVIDER_SITE_OTHER): Payer: Medicare Other | Admitting: Family Medicine

## 2020-10-27 VITALS — BP 112/81 | HR 77 | Resp 16 | Wt 205.0 lb

## 2020-10-27 DIAGNOSIS — E538 Deficiency of other specified B group vitamins: Secondary | ICD-10-CM | POA: Diagnosis not present

## 2020-10-27 DIAGNOSIS — I10 Essential (primary) hypertension: Secondary | ICD-10-CM

## 2020-10-27 DIAGNOSIS — I63522 Cerebral infarction due to unspecified occlusion or stenosis of left anterior cerebral artery: Secondary | ICD-10-CM | POA: Diagnosis not present

## 2020-10-27 DIAGNOSIS — E781 Pure hyperglyceridemia: Secondary | ICD-10-CM

## 2020-10-27 DIAGNOSIS — K219 Gastro-esophageal reflux disease without esophagitis: Secondary | ICD-10-CM | POA: Diagnosis not present

## 2020-10-27 DIAGNOSIS — E7800 Pure hypercholesterolemia, unspecified: Secondary | ICD-10-CM | POA: Diagnosis not present

## 2020-10-27 DIAGNOSIS — R7303 Prediabetes: Secondary | ICD-10-CM | POA: Diagnosis not present

## 2020-10-27 DIAGNOSIS — M791 Myalgia, unspecified site: Secondary | ICD-10-CM

## 2020-10-28 DIAGNOSIS — I63522 Cerebral infarction due to unspecified occlusion or stenosis of left anterior cerebral artery: Secondary | ICD-10-CM | POA: Diagnosis not present

## 2020-10-28 DIAGNOSIS — R7303 Prediabetes: Secondary | ICD-10-CM | POA: Diagnosis not present

## 2020-10-28 DIAGNOSIS — M1990 Unspecified osteoarthritis, unspecified site: Secondary | ICD-10-CM | POA: Diagnosis not present

## 2020-10-28 DIAGNOSIS — J309 Allergic rhinitis, unspecified: Secondary | ICD-10-CM | POA: Diagnosis not present

## 2020-10-28 DIAGNOSIS — E7800 Pure hypercholesterolemia, unspecified: Secondary | ICD-10-CM | POA: Diagnosis not present

## 2020-10-28 DIAGNOSIS — K219 Gastro-esophageal reflux disease without esophagitis: Secondary | ICD-10-CM | POA: Diagnosis not present

## 2020-10-28 DIAGNOSIS — I10 Essential (primary) hypertension: Secondary | ICD-10-CM | POA: Diagnosis not present

## 2020-10-28 DIAGNOSIS — E781 Pure hyperglyceridemia: Secondary | ICD-10-CM | POA: Diagnosis not present

## 2020-10-28 DIAGNOSIS — M791 Myalgia, unspecified site: Secondary | ICD-10-CM | POA: Diagnosis not present

## 2020-10-29 LAB — COMPREHENSIVE METABOLIC PANEL
ALT: 16 IU/L (ref 0–32)
AST: 17 IU/L (ref 0–40)
Albumin/Globulin Ratio: 2.1 (ref 1.2–2.2)
Albumin: 4.6 g/dL (ref 3.7–4.7)
Alkaline Phosphatase: 58 IU/L (ref 44–121)
BUN/Creatinine Ratio: 30 — ABNORMAL HIGH (ref 12–28)
BUN: 22 mg/dL (ref 8–27)
Bilirubin Total: 0.3 mg/dL (ref 0.0–1.2)
CO2: 26 mmol/L (ref 20–29)
Calcium: 9.7 mg/dL (ref 8.7–10.3)
Chloride: 101 mmol/L (ref 96–106)
Creatinine, Ser: 0.73 mg/dL (ref 0.57–1.00)
Globulin, Total: 2.2 g/dL (ref 1.5–4.5)
Glucose: 101 mg/dL — ABNORMAL HIGH (ref 65–99)
Potassium: 4.6 mmol/L (ref 3.5–5.2)
Sodium: 142 mmol/L (ref 134–144)
Total Protein: 6.8 g/dL (ref 6.0–8.5)
eGFR: 88 mL/min/{1.73_m2} (ref >59)

## 2020-10-29 LAB — CBC WITH DIFFERENTIAL/PLATELET
Basophils Absolute: 0 10*3/uL (ref 0.0–0.2)
Basos: 1 %
EOS (ABSOLUTE): 0.1 10*3/uL (ref 0.0–0.4)
Eos: 2 %
Hematocrit: 38.4 % (ref 34.0–46.6)
Hemoglobin: 12.5 g/dL (ref 11.1–15.9)
Immature Grans (Abs): 0 10*3/uL (ref 0.0–0.1)
Immature Granulocytes: 0 %
Lymphocytes Absolute: 1.8 10*3/uL (ref 0.7–3.1)
Lymphs: 31 %
MCH: 29.4 pg (ref 26.6–33.0)
MCHC: 32.6 g/dL (ref 31.5–35.7)
MCV: 90 fL (ref 79–97)
Monocytes Absolute: 0.5 10*3/uL (ref 0.1–0.9)
Monocytes: 8 %
Neutrophils Absolute: 3.4 10*3/uL (ref 1.4–7.0)
Neutrophils: 58 %
Platelets: 283 10*3/uL (ref 150–450)
RBC: 4.25 x10E6/uL (ref 3.77–5.28)
RDW: 12.7 % (ref 11.7–15.4)
WBC: 5.9 10*3/uL (ref 3.4–10.8)

## 2020-10-29 LAB — B12 AND FOLATE PANEL
Folate: 13.1 ng/mL (ref >3.0)
Vitamin B-12: 637 pg/mL (ref 232–1245)

## 2020-10-29 LAB — TSH: TSH: 2.22 u[IU]/mL (ref 0.450–4.500)

## 2020-10-29 LAB — LIPID PANEL
Chol/HDL Ratio: 2.7 ratio (ref 0.0–4.4)
Cholesterol, Total: 114 mg/dL (ref 100–199)
HDL: 42 mg/dL (ref >39)
LDL Chol Calc (NIH): 54 mg/dL (ref 0–99)
Triglycerides: 93 mg/dL (ref 0–149)
VLDL Cholesterol Cal: 18 mg/dL (ref 5–40)

## 2020-10-29 LAB — SEDIMENTATION RATE: Sed Rate: 5 mm/hr (ref 0–40)

## 2020-10-29 LAB — HEMOGLOBIN A1C
Est. average glucose Bld gHb Est-mCnc: 123 mg/dL
Hgb A1c MFr Bld: 5.9 % — ABNORMAL HIGH (ref 4.8–5.6)

## 2020-10-29 LAB — C-REACTIVE PROTEIN: CRP: <1 mg/L (ref 0–10)

## 2020-11-18 ENCOUNTER — Other Ambulatory Visit: Payer: Self-pay | Admitting: Family Medicine

## 2020-11-19 ENCOUNTER — Ambulatory Visit: Payer: Medicare Other

## 2020-12-25 ENCOUNTER — Ambulatory Visit: Payer: Self-pay | Admitting: *Deleted

## 2020-12-25 ENCOUNTER — Other Ambulatory Visit: Payer: Self-pay | Admitting: Neurology

## 2020-12-25 NOTE — Telephone Encounter (Signed)
Pt is calling to ask can she take tylenol with her other meds. She is feeling ackey today in back in shoulders, scratchy throat,  Pt read not to take advil with clopidogrel (PLAVIX) 75 MG tablet [[id number]] . Can she still take tylenol ? Pt had stroke 08/29/20   Pt advised not to take Advil with Plavix. Advised tylenol OK to take with med. Also advised her pharmacist is good resource for any additional questions regarding med interactions. Pt verbalizes understanding. Advised to call back if any additional questions arise. Reason for Disposition  Caller has medicine question only, adult not sick, AND triager answers question  Answer Assessment - Initial Assessment Questions 1. NAME of MEDICATION: "What medicine are you calling about?"     Plavix 2. QUESTION: "What is your question?" (e.g., double dose of medicine, side effect)     CAn I take tylenol with Plavix 3. PRESCRIBING HCP: "Who prescribed it?" Reason: if prescribed by specialist, call should be referred to that group.     *No Answer* 4. SYMPTOMS: "Do you have any symptoms?"     *No Answer* 5. SEVERITY: If symptoms are present, ask "Are they mild, moderate or severe?"     *No Answer* 6. PREGNANCY:  "Is there any chance that you are pregnant?" "When was your last menstrual period?"     *No Answer*  Protocols used: Medication Question Call-A-AH

## 2021-01-01 ENCOUNTER — Ambulatory Visit: Payer: Self-pay | Admitting: *Deleted

## 2021-01-01 NOTE — Telephone Encounter (Signed)
Please advise 

## 2021-01-01 NOTE — Telephone Encounter (Signed)
Patient advised.

## 2021-01-01 NOTE — Telephone Encounter (Signed)
Pt reports cough, mostly non productive but clear when she does cough up phlegm. Tested covid negative this AM. Reports "Little" runny nose at times and mild scratchy throat. Denies fever, no SOB, voice hoarse. States coughing spells during day, cough did keep her awake at HS. Has not taken any OTC meds. Pt requesting Dr. Alben Spittle recommendations regarding what OTC meds she can take with Plavix. Assured pt NT would route to practice for PCPs review and final disposition. Care advise given per protocol, verbalizes understanding.   Please advise: [phone]

## 2021-01-01 NOTE — Telephone Encounter (Signed)
Pt called stating that she is having a bad cough, sleepless nights, and some hoarseness. She states that she had a stroke in July and on plavix and is requesting to know which otc drugs she can take to help with her current symptoms. Please advise.      Reason for Disposition  SEVERE coughing spells (e.g., whooping sound after coughing, vomiting after coughing)  Answer Assessment - Initial Assessment Questions 1. ONSET: "When did the cough begin?"      yesterday 2. SEVERITY: "How bad is the cough today?"      Awake at night, bad spells 3. SPUTUM: "Describe the color of your sputum" (none, dry cough; clear, white, yellow, green)     Rarely productive, clear when does 4. HEMOPTYSIS: "Are you coughing up any blood?" If so ask: "How much?" (flecks, streaks, tablespoons, etc.)     no 5. DIFFICULTY BREATHING: "Are you having difficulty breathing?" If Yes, ask: "How bad is it?" (e.g., mild, moderate, severe)    - MILD: No SOB at rest, mild SOB with walking, speaks normally in sentences, can lie down, no retractions, pulse < 100.    - MODERATE: SOB at rest, SOB with minimal exertion and prefers to sit, cannot lie down flat, speaks in phrases, mild retractions, audible wheezing, pulse 100-120.    - SEVERE: Very SOB at rest, speaks in single words, struggling to breathe, sitting hunched forward, retractions, pulse > 120      Moderate, varies 6. FEVER: "Do you have a fever?" If Yes, ask: "What is your temperature, how was it measured, and when did it start?"     no 7. CARDIAC HISTORY: "Do you have any history of heart disease?" (e.g., heart attack, congestive heart failure)      *No Answer* 8. LUNG HISTORY: "Do you have any history of lung disease?"  (e.g., pulmonary embolus, asthma, emphysema)     *No Answer* 9. PE RISK FACTORS: "Do you have a history of blood clots?" (or: recent major surgery, recent prolonged travel, bedridden)     *No Answer* 10. OTHER SYMPTOMS: "Do you have any other  symptoms?" (e.g., runny nose, wheezing, chest pain)       Runny nose at times, throat scratchy, mild.  Protocols used: Cough - Acute Productive-A-AH

## 2021-01-02 ENCOUNTER — Other Ambulatory Visit: Payer: Self-pay | Admitting: Family Medicine

## 2021-01-02 DIAGNOSIS — Z1231 Encounter for screening mammogram for malignant neoplasm of breast: Secondary | ICD-10-CM

## 2021-01-05 ENCOUNTER — Other Ambulatory Visit: Payer: Self-pay | Admitting: Family Medicine

## 2021-01-05 ENCOUNTER — Other Ambulatory Visit: Payer: Self-pay | Admitting: Neurology

## 2021-01-05 NOTE — Telephone Encounter (Signed)
Medication Refill - Medication:  clopidogrel (PLAVIX) 75 MG tablet   Has the patient contacted their pharmacy? Yes.    Preferred Pharmacy (with phone number or street name):  [id number] [address], [place] Phone:  [phone]  Fax:  [phone]     Has the patient been seen for an appointment in the last year OR does the patient have an upcoming appointment? Yes.    Agent: Please be advised that RX refills may take up to 3 business days. We ask that you follow-up with your pharmacy.

## 2021-01-06 MED ORDER — CLOPIDOGREL BISULFATE 75 MG PO TABS: 75.0000 mg | ORAL_TABLET | Freq: Every day | ORAL | 3 refills | Status: DC

## 2021-01-06 NOTE — Telephone Encounter (Signed)
Requested medications are on the active medication list yes  Last visit 10/27/20  Future visit scheduled 02/23/21  Notes to clinic Historical Provider, please assess. Requested Prescriptions  Pending Prescriptions Disp Refills   clopidogrel (PLAVIX) 75 MG tablet      Sig: Take 1 tablet (75 mg total) by mouth daily.     Hematology: Antiplatelets - clopidogrel Failed - 01/05/2021  3:55 PM      Failed - Evaluate AST, ALT within 2 months of therapy initiation.      Passed - ALT in normal range and within 360 days    ALT  Date Value Ref Range Status  10/28/2020 16 0 - 32 IU/L Final          Passed - AST in normal range and within 360 days    AST  Date Value Ref Range Status  10/28/2020 17 0 - 40 IU/L Final          Passed - HCT in normal range and within 180 days    Hematocrit  Date Value Ref Range Status  10/28/2020 38.4 34.0 - 46.6 % Final          Passed - HGB in normal range and within 180 days    Hemoglobin  Date Value Ref Range Status  10/28/2020 12.5 11.1 - 15.9 g/dL Final          Passed - PLT in normal range and within 180 days    Platelets  Date Value Ref Range Status  10/28/2020 283 150 - 450 x10E3/uL Final          Passed - Valid encounter within last 6 months    Recent Outpatient Visits           2 months ago Essential (primary) hypertension   [facility] Jerrol Banana., MD   4 months ago Acute ischemic left ACA stroke [facility])   [facility] Jerrol Banana., MD   4 months ago Essential (primary) hypertension   [facility] Jerrol Banana., MD   11 months ago Acute non-recurrent pansinusitis   [facility] Flinchum, Kelby Aline, FNP   1 year ago [place] and fatigue   [facility] Jerrol Banana., MD       Future Appointments             In 1 month Jerrol Banana., MD [facility], [place]

## 2021-02-15 ENCOUNTER — Telehealth: Payer: Medicare Other | Admitting: Physician Assistant

## 2021-02-15 ENCOUNTER — Encounter: Payer: Self-pay | Admitting: Physician Assistant

## 2021-02-15 DIAGNOSIS — U071 COVID-19: Secondary | ICD-10-CM | POA: Diagnosis not present

## 2021-02-15 MED ORDER — MOLNUPIRAVIR EUA 200MG CAPSULE: 4.0000 | ORAL_CAPSULE | Freq: Two times a day (BID) | ORAL | 0 refills | Status: AC

## 2021-02-15 MED ORDER — IPRATROPIUM BROMIDE 0.03 % NA SOLN: 2.0000 | Freq: Two times a day (BID) | NASAL | 0 refills | Status: DC

## 2021-02-15 MED ORDER — MOLNUPIRAVIR EUA 200MG CAPSULE: 4.0000 | ORAL_CAPSULE | Freq: Two times a day (BID) | ORAL | 0 refills | Status: DC

## 2021-02-15 NOTE — Progress Notes (Signed)
Virtual Visit Consent   Sharon Small, you are scheduled for a virtual visit with a [place] provider today.     Just as with appointments in the office, your consent must be obtained to participate.  Your consent will be active for this visit and any virtual visit you may have with one of our providers in the next 365 days.     If you have a MyChart account, a copy of this consent can be sent to you electronically.  All virtual visits are billed to your insurance company just like a traditional visit in the office.    As this is a virtual visit, video technology does not allow for your provider to perform a traditional examination.  This may limit your provider's ability to fully assess your condition.  If your provider identifies any concerns that need to be evaluated in person or the need to arrange testing (such as labs, EKG, etc.), we will make arrangements to do so.     Although advances in technology are sophisticated, we cannot ensure that it will always work on either your end or our end.  If the connection with a video visit is poor, the visit may have to be switched to a telephone visit.  With either a video or telephone visit, we are not always able to ensure that we have a secure connection.     I need to obtain your verbal consent now.   Are you willing to proceed with your visit today?    Sharon Small has provided verbal consent on 02/15/2021 for a virtual visit (video or telephone).   Mar Daring, PA-C   Date: 02/15/2021 9:45 AM   Virtual Visit via Video Note   I, Mar Daring, connected with  Sharon Small  ([id number], [birth year]) on 02/15/21 at  9:30 AM EST by a video-enabled telemedicine application and verified that I am speaking with the correct person using two identifiers.  Location: Patient: Virtual Visit Location Patient: Home Provider: Virtual Visit Location Provider: Home Office   I discussed the limitations of evaluation and management  by telemedicine and the availability of in person appointments. The patient expressed understanding and agreed to proceed.    History of Present Illness: Sharon Small is a 71 y.o. who identifies as a female who was assigned female at birth, and is being seen today for Covid 57.  HPI: URI  This is a new problem. Episode onset: Symptoms started on Thursday 02/12/21; Tested positive this morning. The problem has been gradually worsening. There has been no fever. Associated symptoms include congestion, coughing, headaches, rhinorrhea, sinus pain and a sore throat. Pertinent negatives include no ear pain or plugged ear sensation. She has tried nothing for the symptoms. The treatment provided no relief.     Problems:  Patient Active Problem List   Diagnosis Date Noted   Acute ischemic left ACA stroke ([place]) 08/29/2020   Special screening for malignant neoplasms, colon    Borderline diabetes 07/08/2014   Muscle ache 07/08/2014   Snores 07/08/2014   B12 deficiency 07/08/2014   Malaise and fatigue 12/26/2008   Allergic rhinitis 05/29/2008   Acid reflux 05/29/2008   Essential (primary) hypertension 05/29/2008   HLD (hyperlipidemia) 05/29/2008   Adiposity 05/29/2008   Arthritis, degenerative 05/29/2008    Allergies: No Known Allergies Medications:  Current Outpatient Medications:    ipratropium (ATROVENT) 0.03 % nasal spray, Place 2 sprays into both nostrils every 12 (twelve) hours., Disp:  30 mL, Rfl: 0   molnupiravir EUA (LAGEVRIO) 200 mg CAPS capsule, Take 4 capsules (800 mg total) by mouth 2 (two) times daily for 5 days., Disp: 40 capsule, Rfl: 0   amLODipine (NORVASC) 5 MG tablet, Take 1 tablet (5 mg total) by mouth daily., Disp: 90 tablet, Rfl: 3   Azelaic Acid 15 % gel, Apply a small amount to skin 1-2 times daily as needed., Disp: 30 g, Rfl: 5   benazepril (LOTENSIN) 40 MG tablet, TAKE 1 TABLET(40 MG) BY MOUTH DAILY, Disp: 90 tablet, Rfl: 0   bisoprolol-hydrochlorothiazide (ZIAC)  10-6.25 MG tablet, TAKE 1 TABLET BY MOUTH DAILY, Disp: 90 tablet, Rfl: 0   chlorpheniramine-HYDROcodone (TUSSIONEX PENNKINETIC ER) 10-8 MG/5ML SUER, Take 5 mLs by mouth every 12 (twelve) hours as needed for cough. (Patient not taking: No sig reported), Disp: 140 mL, Rfl: 0   clopidogrel (PLAVIX) 75 MG tablet, Take 1 tablet (75 mg total) by mouth daily., Disp: 90 tablet, Rfl: 3   omeprazole (PRILOSEC) 20 MG capsule, Take 20 mg by mouth daily., Disp: , Rfl:    rosuvastatin (CRESTOR) 40 MG tablet, Take 1 tablet (40 mg total) by mouth daily., Disp: 90 tablet, Rfl: 3   white petrolatum (VASELINE) GEL, Apply 1 application topically as needed for lip care. For dry lips, Disp: , Rfl:   Observations/Objective: Patient is well-developed, well-nourished in no acute distress.  Resting comfortably at home.  Head is normocephalic, atraumatic.  No labored breathing.  Speech is clear and coherent with logical content.  Patient is alert and oriented at baseline.    Assessment and Plan: 1. COVID-19 - molnupiravir EUA (LAGEVRIO) 200 mg CAPS capsule; Take 4 capsules (800 mg total) by mouth 2 (two) times daily for 5 days.  Dispense: 40 capsule; Refill: 0 - ipratropium (ATROVENT) 0.03 % nasal spray; Place 2 sprays into both nostrils every 12 (twelve) hours.  Dispense: 30 mL; Refill: 0  - Continue OTC symptomatic management of choice - Will send OTC vitamins and supplement information through AVS - Molnupiravir prescribed - Patient enrolled in MyChart symptom monitoring - Push fluids - Rest as needed - Discussed return precautions and when to seek in-person evaluation, sent via AVS as well  Follow Up Instructions: I discussed the assessment and treatment plan with the patient. The patient was provided an opportunity to ask questions and all were answered. The patient agreed with the plan and demonstrated an understanding of the instructions.  A copy of instructions were sent to the patient via MyChart unless  otherwise noted below.    The patient was advised to call back or seek an in-person evaluation if the symptoms worsen or if the condition fails to improve as anticipated.  Time:  I spent 15 minutes with the patient via telehealth technology discussing the above problems/concerns.    Mar Daring, PA-C

## 2021-02-15 NOTE — Addendum Note (Signed)
Addended by: Mar Daring on: 02/15/2021 10:07 AM   Modules accepted: Orders

## 2021-02-15 NOTE — Patient Instructions (Signed)
Harlon Ditty, thank you for joining Mar Daring, PA-C for today's virtual visit.  While this provider is not your primary care provider (PCP), if your PCP is located in our provider database this encounter information will be shared with them immediately following your visit.  Consent: (Patient) Sharon Small provided verbal consent for this virtual visit at the beginning of the encounter.  Current Medications:  Current Outpatient Medications:    ipratropium (ATROVENT) 0.03 % nasal spray, Place 2 sprays into both nostrils every 12 (twelve) hours., Disp: 30 mL, Rfl: 0   molnupiravir EUA (LAGEVRIO) 200 mg CAPS capsule, Take 4 capsules (800 mg total) by mouth 2 (two) times daily for 5 days., Disp: 40 capsule, Rfl: 0   amLODipine (NORVASC) 5 MG tablet, Take 1 tablet (5 mg total) by mouth daily., Disp: 90 tablet, Rfl: 3   Azelaic Acid 15 % gel, Apply a small amount to skin 1-2 times daily as needed., Disp: 30 g, Rfl: 5   benazepril (LOTENSIN) 40 MG tablet, TAKE 1 TABLET(40 MG) BY MOUTH DAILY, Disp: 90 tablet, Rfl: 0   bisoprolol-hydrochlorothiazide (ZIAC) 10-6.25 MG tablet, TAKE 1 TABLET BY MOUTH DAILY, Disp: 90 tablet, Rfl: 0   chlorpheniramine-HYDROcodone (TUSSIONEX PENNKINETIC ER) 10-8 MG/5ML SUER, Take 5 mLs by mouth every 12 (twelve) hours as needed for cough. (Patient not taking: No sig reported), Disp: 140 mL, Rfl: 0   clopidogrel (PLAVIX) 75 MG tablet, Take 1 tablet (75 mg total) by mouth daily., Disp: 90 tablet, Rfl: 3   omeprazole (PRILOSEC) 20 MG capsule, Take 20 mg by mouth daily., Disp: , Rfl:    rosuvastatin (CRESTOR) 40 MG tablet, Take 1 tablet (40 mg total) by mouth daily., Disp: 90 tablet, Rfl: 3   white petrolatum (VASELINE) GEL, Apply 1 application topically as needed for lip care. For dry lips, Disp: , Rfl:    Medications ordered in this encounter:  Meds ordered this encounter  Medications   molnupiravir EUA (LAGEVRIO) 200 mg CAPS capsule    Sig: Take 4  capsules (800 mg total) by mouth 2 (two) times daily for 5 days.    Dispense:  40 capsule    Refill:  0    Order Specific Question:   Supervising Provider    Answer:   MILLER, BRIAN [[id number]]   ipratropium (ATROVENT) 0.03 % nasal spray    Sig: Place 2 sprays into both nostrils every 12 (twelve) hours.    Dispense:  30 mL    Refill:  0    Order Specific Question:   Supervising Provider    Answer:   Sabra Heck, BRIAN [[id number]]     *If you need refills on other medications prior to your next appointment, please contact your pharmacy*  Follow-Up: Call back or seek an in-person evaluation if the symptoms worsen or if the condition fails to improve as anticipated.  Other Instructions 10 Things You Can Do to Manage Your COVID-19 Symptoms at Home If you have possible or confirmed COVID-19 Stay home except to get medical care. Monitor your symptoms carefully. If your symptoms get worse, call your healthcare provider immediately. Get rest and stay hydrated. If you have a medical appointment, call the healthcare provider ahead of time and tell them that you have or may have COVID-19. For medical emergencies, call 911 and notify the dispatch personnel that you have or may have COVID-19. Cover your cough and sneezes with a tissue or use the inside of your elbow. Wash your hands often with  soap and water for at least 20 seconds or clean your hands with an alcohol-based hand sanitizer that contains at least 60% alcohol. As much as possible, stay in a specific room and away from other people in your home. Also, you should use a separate bathroom, if available. If you need to be around other people in or outside of the home, wear a mask. Avoid sharing personal items with other people in your household, like dishes, towels, and bedding. Clean all surfaces that are touched often, like counters, tabletops, and doorknobs. Use household cleaning sprays or wipes according to the label  instructions. [email or web address] 08/31/2019 This information is not intended to replace advice given to you by your health care provider. Make sure you discuss any questions you have with your health care provider. Document Revised: 10/24/2020 Document Reviewed: 10/24/2020 Elsevier Patient Education  2022 [employer].    If you have been instructed to have an in-person evaluation today at a local Urgent Care facility, please use the link below. It will take you to a list of all of our available [facility], including address, phone number and hours of operation. Please do not delay care.  [facility]  If you or a family member do not have a primary care provider, use the link below to schedule a visit and establish care. When you choose a [facility] primary care physician or advanced practice provider, you gain a long-term partner in health. Find a Primary Care Provider  Learn more about [facility]'s in-office and virtual care options: [facility]

## 2021-02-16 ENCOUNTER — Other Ambulatory Visit: Payer: Self-pay | Admitting: Family Medicine

## 2021-02-23 ENCOUNTER — Other Ambulatory Visit: Payer: Self-pay

## 2021-02-23 ENCOUNTER — Ambulatory Visit (INDEPENDENT_AMBULATORY_CARE_PROVIDER_SITE_OTHER): Payer: Medicare Other | Admitting: Family Medicine

## 2021-02-23 ENCOUNTER — Ambulatory Visit: Payer: Self-pay | Admitting: Family Medicine

## 2021-02-23 ENCOUNTER — Encounter: Payer: Self-pay | Admitting: Family Medicine

## 2021-02-23 VITALS — BP 128/82 | HR 58 | Temp 98.7°F | Resp 16 | Ht 66.0 in | Wt 199.0 lb

## 2021-02-23 DIAGNOSIS — R7303 Prediabetes: Secondary | ICD-10-CM

## 2021-02-23 DIAGNOSIS — E7800 Pure hypercholesterolemia, unspecified: Secondary | ICD-10-CM

## 2021-02-23 DIAGNOSIS — I639 Cerebral infarction, unspecified: Secondary | ICD-10-CM | POA: Diagnosis not present

## 2021-02-23 DIAGNOSIS — K219 Gastro-esophageal reflux disease without esophagitis: Secondary | ICD-10-CM

## 2021-02-23 DIAGNOSIS — I10 Essential (primary) hypertension: Secondary | ICD-10-CM | POA: Diagnosis not present

## 2021-02-23 NOTE — Assessment & Plan Note (Signed)
Doing well on current regimen, no changes made today. 

## 2021-02-23 NOTE — Progress Notes (Signed)
° °  SUBJECTIVE:   CHIEF COMPLAINT / HPI:   Hypertension: - Medications: benazepril, bisoprolol/HCTZ, amlodipine - Compliance: good, spacing out meds throughout the day, helping with fatigue/dizziness - Checking BP at home: not currently - Denies any LE edema, medication SEs, or symptoms of hypotension  Prediabetes - Last A1c 5.9 10/2020 - Medications: none - Compliance: n/a - Checking BG at home: no - Denies symptoms of hypoglycemia, polyuria, polydipsia, numbness extremities, foot ulcers/trauma  HLD - medications: rosuvastatin - compliance: good - medication SEs: none  H/o stroke - L ACA infarct. Initially with R leg weakness and trouble walking, symptoms now resolved. on plavix, statin. Sees neuro in Feb.  Recent COVID - symptom onset 02/12/21, typical URI sx, s/p molnupiravir. Doing well.   GERD - Meds: omeprazole - Symptoms:  heartburn.  - worsened by chocolate, spicy foods, eating late. - denies dysphagia  denies melena, hematochezia, hematemesis, and coffee ground emesis.    OBJECTIVE:   BP 128/82 (BP Location: Left Arm, Patient Position: Sitting, Cuff Size: Large)    Pulse (!) 58    Temp 98.7 F (37.1 C) (Temporal)    Resp 16    Ht 5\' 6"  (1.676 m)    Wt 199 lb (90.3 kg)    SpO2 98%    BMI 32.12 kg/m   Gen: well appearing, in NAD Card: RRR Lungs: CTAB Ext: WWP, no edema   ASSESSMENT/PLAN:   Stroke [facility]) Doing well and without neuro deficits, tolerating plavix. Work to maintain adequate lipid, glycemic, and BP control.  Continue to follow with Neuro.  Essential (primary) hypertension Doing well on current regimen, no changes made today.  Acid reflux Doing well on current regimen, no changes made today.  Borderline diabetes Recheck a1c.  HLD (hyperlipidemia) Tolerant of statin, continue. Last labs within goal.    F/u in 6 mths.  Myles Gip, DO

## 2021-02-23 NOTE — Assessment & Plan Note (Addendum)
Doing well and without neuro deficits, tolerating plavix. Work to maintain adequate lipid, glycemic, and BP control.  Continue to follow with Neuro.

## 2021-02-23 NOTE — Patient Instructions (Signed)
It was great to see you!  Our plans for today:  - No changes to your medications.  - We are checking some labs today, we will release these results to your MyChart.  Take care and seek immediate care sooner if you develop any concerns.   Dr. Benjamyn Hestand  

## 2021-02-23 NOTE — Assessment & Plan Note (Signed)
Recheck a1c. °

## 2021-02-23 NOTE — Assessment & Plan Note (Signed)
Tolerant of statin, continue. Last labs within goal.

## 2021-02-24 LAB — BASIC METABOLIC PANEL
BUN/Creatinine Ratio: 28 (ref 12–28)
BUN: 21 mg/dL (ref 8–27)
CO2: 26 mmol/L (ref 20–29)
Calcium: 9.6 mg/dL (ref 8.7–10.3)
Chloride: 102 mmol/L (ref 96–106)
Creatinine, Ser: 0.74 mg/dL (ref 0.57–1.00)
Glucose: 106 mg/dL — ABNORMAL HIGH (ref 70–99)
Potassium: 4.7 mmol/L (ref 3.5–5.2)
Sodium: 142 mmol/L (ref 134–144)
eGFR: 86 mL/min/{1.73_m2} (ref >59)

## 2021-02-24 LAB — HEMOGLOBIN A1C
Est. average glucose Bld gHb Est-mCnc: 120 mg/dL
Hgb A1c MFr Bld: 5.8 % — ABNORMAL HIGH (ref 4.8–5.6)

## 2021-02-25 ENCOUNTER — Encounter: Payer: Self-pay | Admitting: Family Medicine

## 2021-03-10 ENCOUNTER — Ambulatory Visit
Admission: RE | Admit: 2021-03-10 | Discharge: 2021-03-10 | Disposition: A | Discharge status: Home / Self Care | Payer: Medicare Other | Source: Ambulatory Visit | Attending: Family Medicine | Admitting: Family Medicine

## 2021-03-10 ENCOUNTER — Other Ambulatory Visit: Payer: Self-pay

## 2021-03-10 DIAGNOSIS — Z1231 Encounter for screening mammogram for malignant neoplasm of breast: Secondary | ICD-10-CM | POA: Insufficient documentation

## 2021-03-24 ENCOUNTER — Other Ambulatory Visit: Payer: Self-pay

## 2021-03-24 ENCOUNTER — Ambulatory Visit: Payer: Medicare Other | Admitting: Dermatology

## 2021-03-24 DIAGNOSIS — L578 Other skin changes due to chronic exposure to nonionizing radiation: Secondary | ICD-10-CM

## 2021-03-24 DIAGNOSIS — L918 Other hypertrophic disorders of the skin: Secondary | ICD-10-CM

## 2021-03-24 DIAGNOSIS — L719 Rosacea, unspecified: Secondary | ICD-10-CM | POA: Diagnosis not present

## 2021-03-24 DIAGNOSIS — M2012 Hallux valgus (acquired), left foot: Secondary | ICD-10-CM | POA: Diagnosis not present

## 2021-03-24 DIAGNOSIS — M2042 Other hammer toe(s) (acquired), left foot: Secondary | ICD-10-CM | POA: Diagnosis not present

## 2021-03-24 DIAGNOSIS — L820 Inflamed seborrheic keratosis: Secondary | ICD-10-CM

## 2021-03-24 DIAGNOSIS — L821 Other seborrheic keratosis: Secondary | ICD-10-CM

## 2021-03-24 DIAGNOSIS — D229 Melanocytic nevi, unspecified: Secondary | ICD-10-CM | POA: Diagnosis not present

## 2021-03-24 DIAGNOSIS — M7661 Achilles tendinitis, right leg: Secondary | ICD-10-CM | POA: Diagnosis not present

## 2021-03-24 DIAGNOSIS — D225 Melanocytic nevi of trunk: Secondary | ICD-10-CM | POA: Diagnosis not present

## 2021-03-24 DIAGNOSIS — D2372 Other benign neoplasm of skin of left lower limb, including hip: Secondary | ICD-10-CM

## 2021-03-24 DIAGNOSIS — M2041 Other hammer toe(s) (acquired), right foot: Secondary | ICD-10-CM | POA: Diagnosis not present

## 2021-03-24 DIAGNOSIS — L814 Other melanin hyperpigmentation: Secondary | ICD-10-CM

## 2021-03-24 DIAGNOSIS — Z1283 Encounter for screening for malignant neoplasm of skin: Secondary | ICD-10-CM | POA: Diagnosis not present

## 2021-03-24 DIAGNOSIS — M2011 Hallux valgus (acquired), right foot: Secondary | ICD-10-CM | POA: Diagnosis not present

## 2021-03-24 DIAGNOSIS — D1800 Hemangioma unspecified site: Secondary | ICD-10-CM

## 2021-03-24 DIAGNOSIS — D239 Other benign neoplasm of skin, unspecified: Secondary | ICD-10-CM

## 2021-03-24 NOTE — Patient Instructions (Addendum)

## 2021-03-24 NOTE — Progress Notes (Signed)
Follow-Up Visit   Subjective  Sharon Small is a 71 y.o. female who presents for the following: Annual Exam.  The patient presents for Total-Body Skin Exam (TBSE) for skin cancer screening and mole check.  The patient has spots, moles and lesions to be evaluated, some may be new or changing. She has a couple of growths on her left arm that get irritated.   She has rosacea and is currently using and controlled by azelaic 15% gel daily.   The following portions of the chart were reviewed this encounter and updated as appropriate:       Review of Systems:  No other skin or systemic complaints except as noted in HPI or Assessment and Plan.  Objective  Well appearing patient in no apparent distress; mood and affect are within normal limits.  A full examination was performed including scalp, head, eyes, ears, nose, lips, neck, chest, axillae, abdomen, back, buttocks, bilateral upper extremities, bilateral lower extremities, hands, feet, fingers, toes, fingernails, and toenails. All findings within normal limits unless otherwise noted below.  face Face clear today.  L antecubital x 2, L mid upper back at braline x 2 (4) Erythematous stuck-on, waxy papule  left infraocular 2.48mm firm blue papule  spinal lower back 6.0 mm tan papule, itchy per pt    Assessment & Plan  Skin cancer screening performed today.  Actinic Damage - chronic, secondary to cumulative UV radiation exposure/sun exposure over time - diffuse scaly erythematous macules with underlying dyspigmentation - Recommend daily broad spectrum sunscreen SPF 30+ to sun-exposed areas, reapply every 2 hours as needed.  - Recommend staying in the shade or wearing long sleeves, sun glasses (UVA+UVB protection) and wide brim hats (4-inch brim around the entire circumference of the hat). - Call for new or changing lesions.  Lentigines - Scattered tan macules - Due to sun exposure - Benign-appering, observe - Recommend  daily broad spectrum sunscreen SPF 30+ to sun-exposed areas, reapply every 2 hours as needed. - Call for any changes  Seborrheic Keratoses - Stuck-on, waxy, tan-brown papules and/or plaques  - Benign-appearing - Discussed benign etiology and prognosis. - Observe - Call for any changes  Hemangiomas - Red papules - Discussed benign nature - Observe - Call for any changes  Acrochordons (Skin Tags) - Fleshy, skin-colored pedunculated papules - Benign appearing.  - Observe. - If desired, they can be removed with an in office procedure that is not covered by insurance. - Please call the clinic if you notice any new or changing lesions.  Melanocytic Nevi - Tan-brown and/or pink-flesh-colored symmetric macules and papules - Benign appearing on exam today - Observation - Call clinic for new or changing moles - Recommend daily use of broad spectrum spf 30+ sunscreen to sun-exposed areas.   Dermatofibroma - Firm pink/brown papulenodule with dimple sign of the right upper arm, left lateral ankle 9.30mm - Benign appearing, stable - Call for any changes  Milia - tiny firm white papule of the left nasal root - type of cyst - benign - may be extracted if symptomatic - observe  Sebaceous Hyperplasia - Small yellow papules with a central dell - Benign - Observe  Rosacea face  Chronic condition. Currently well-controlled.   Rosacea is a chronic progressive skin condition usually affecting the face of adults, causing redness and/or acne bumps. It is treatable but not curable. It sometimes affects the eyes (ocular rosacea) as well. It may respond to topical and/or systemic medication and can flare with stress,  sun exposure, alcohol, exercise and some foods.  Daily application of broad spectrum spf 30+ sunscreen to face is recommended to reduce flares.  Related Medications Azelaic Acid 15 % gel Apply a small amount to skin 1-2 times daily as needed.  Inflamed seborrheic keratosis  (4) L antecubital x 2, L mid upper back at braline x 2  Destruction of lesion - L antecubital x 2, L mid upper back at braline x 2  Destruction method: cryotherapy   Informed consent: discussed and consent obtained   Lesion destroyed using liquid nitrogen: Yes   Region frozen until ice ball extended beyond lesion: Yes   Outcome: patient tolerated procedure well with no complications   Post-procedure details: wound care instructions given   Additional details:  Prior to procedure, discussed risks of blister formation, small wound, skin dyspigmentation, or rare scar following cryotherapy. Recommend Vaseline ointment to treated areas while healing.   Blue nevus left infraocular  vs Cyst  Present for years per pt. Benign-appearing.  Observation.  Call clinic for new or changing moles.  Recommend daily use of broad spectrum spf 30+ sunscreen to sun-exposed areas.    Nevus spinal lower back  Benign-appearing.  Observation.  Call clinic for new or changing moles.  Recommend daily use of broad spectrum spf 30+ sunscreen to sun-exposed areas.   Discussed shave removal since bothersome. Pt defers today.    Return in about 1 year (around 03/24/2022) for TBSE, rosacea.  IJamesetta Orleans, CMA, am acting as scribe for Brendolyn Patty, MD .  Documentation: I have reviewed the above documentation for accuracy and completeness, and I agree with the above.  Brendolyn Patty MD

## 2021-03-25 ENCOUNTER — Ambulatory Visit: Payer: Medicare Other | Admitting: Neurology

## 2021-04-15 DIAGNOSIS — H40003 Preglaucoma, unspecified, bilateral: Secondary | ICD-10-CM | POA: Diagnosis not present

## 2021-04-21 ENCOUNTER — Encounter: Payer: Self-pay | Admitting: Neurology

## 2021-04-21 ENCOUNTER — Ambulatory Visit: Payer: Medicare Other | Admitting: Neurology

## 2021-05-17 ENCOUNTER — Other Ambulatory Visit: Payer: Self-pay | Admitting: Family Medicine

## 2021-05-19 NOTE — Telephone Encounter (Signed)
Requested Prescriptions  ?Pending Prescriptions Disp Refills  ?? benazepril (LOTENSIN) 40 MG tablet [Pharmacy Med Name: BENAZEPRIL 40MG TABLETS] 90 tablet 0  ?  Sig: TAKE 1 TABLET(40 MG) BY MOUTH DAILY  ?  ? Cardiovascular:  ACE Inhibitors Passed - 05/17/2021  6:22 AM  ?  ?  Passed - Cr in normal range and within 180 days  ?  Creatinine, Ser  ?Date Value Ref Range Status  ?02/23/2021 0.74 0.57 - 1.00 mg/dL Final  ?   ?  ?  Passed - K in normal range and within 180 days  ?  Potassium  ?Date Value Ref Range Status  ?02/23/2021 4.7 3.5 - 5.2 mmol/L Final  ?   ?  ?  Passed - Patient is not pregnant  ?  ?  Passed - Last BP in normal range  ?  BP Readings from Last 1 Encounters:  ?02/23/21 128/82  ?   ?  ?  Passed - Valid encounter within last 6 months  ?  Recent Outpatient Visits   ?      ? 2 months ago Essential (primary) hypertension  ? [facility] Rumball, Alison M, DO  ? 6 months ago Essential (primary) hypertension  ? [facility] Gilbert, Richard L Jr., MD  ? 8 months ago Acute ischemic left ACA stroke (HCC)  ? [facility] Gilbert, Richard L Jr., MD  ? 9 months ago Essential (primary) hypertension  ? [facility] Gilbert, Richard L Jr., MD  ? 1 year ago Acute non-recurrent pansinusitis  ? [facility] Flinchum, Michelle S, FNP  ?  ?  ? ?  ?  ?  ?? bisoprolol-hydrochlorothiazide (ZIAC) 10-6.25 MG tablet [Pharmacy Med Name: BISOPROLOL/HCTZ 10MG/6.25MG TABS] 90 tablet 0  ?  Sig: TAKE 1 TABLET BY MOUTH DAILY  ?  ? Cardiovascular: Beta Blocker + Diuretic Combos Passed - 05/17/2021  6:22 AM  ?  ?  Passed - K in normal range and within 180 days  ?  Potassium  ?Date Value Ref Range Status  ?02/23/2021 4.7 3.5 - 5.2 mmol/L Final  ?   ?  ?  Passed - Na in normal range and within 180 days  ?  Sodium  ?Date Value Ref Range Status  ?02/23/2021 142 134 - 144 mmol/L Final  ?   ?  ?  Passed - Cr in normal range and within 180 days  ?  Creatinine, Ser  ?Date Value  Ref Range Status  ?02/23/2021 0.74 0.57 - 1.00 mg/dL Final  ?   ?  ?  Passed - eGFR in normal range and within 180 days  ?  GFR calc Af Amer  ?Date Value Ref Range Status  ?01/08/2020 102 >59 mL/min/1.73 Final  ?  Comment:  ?  **In accordance with recommendations from the NKF-ASN Task force,** ?  [facility] is in the process of updating its eGFR calculation to the ?  2021 CKD-EPI creatinine equation that estimates kidney function ?  without a race variable. ?  ? ?GFR, Estimated  ?Date Value Ref Range Status  ?08/30/2020 >60 >60 mL/min Final  ?  Comment:  ?  (NOTE) ?Calculated using the CKD-EPI Creatinine Equation (2021) ?  ? ?eGFR  ?Date Value Ref Range Status  ?02/23/2021 86 >59 mL/min/1.73 Final  ?   ?  ?  Passed - Last BP in normal range  ?  BP Readings from Last 1 Encounters:  ?02/23/21 128/82  ?   ?  ?    Passed - Last Heart Rate in normal range  ?  Pulse Readings from Last 1 Encounters:  ?02/23/21 (!) 58  ?   ?  ?  Passed - Valid encounter within last 6 months  ?  Recent Outpatient Visits   ?      ? 2 months ago Essential (primary) hypertension  ? [place], DO  ? 6 months ago Essential (primary) hypertension  ? [facility] Jerrol Banana., MD  ? 8 months ago Acute ischemic left ACA stroke [facility])  ? [facility] Jerrol Banana., MD  ? 9 months ago Essential (primary) hypertension  ? [facility] Jerrol Banana., MD  ? 1 year ago Acute non-recurrent pansinusitis  ? [place], FNP  ?  ?  ? ?  ?  ?  ? ? ?

## 2021-08-15 ENCOUNTER — Other Ambulatory Visit: Payer: Self-pay | Admitting: Family Medicine

## 2021-08-15 DIAGNOSIS — I10 Essential (primary) hypertension: Secondary | ICD-10-CM

## 2021-08-22 ENCOUNTER — Other Ambulatory Visit: Payer: Self-pay | Admitting: Family Medicine

## 2021-08-22 DIAGNOSIS — E7800 Pure hypercholesterolemia, unspecified: Secondary | ICD-10-CM

## 2021-08-26 ENCOUNTER — Ambulatory Visit: Payer: Medicare Other | Admitting: Family Medicine

## 2021-08-26 NOTE — Progress Notes (Deleted)
    Established patient visit   Patient: Sharon Small   DOB: 10/16/1950   71 y.o. Female  MRN: [id number] Visit Date: 08/27/2021  Today's healthcare provider: Richard Gilbert Jr, MD   No chief complaint on file.  Subjective    HPI  Hypertension, follow-up  BP Readings from Last 3 Encounters:  02/23/21 128/82  10/27/20 112/81  10/17/20 126/82   Wt Readings from Last 3 Encounters:  02/23/21 199 lb (90.3 kg)  10/27/20 205 lb (93 kg)  10/17/20 208 lb 2 oz (94.4 kg)     She was last seen for hypertension 6 months ago.  Management since that visit includes; Doing well on current regimen, no changes made today..  Outside blood pressures are {***enter patient reported home BP readings, or 'not being checked':1}.  Pertinent labs Lab Results  Component Value Date   CHOL 114 10/28/2020   HDL 42 10/28/2020   LDLCALC 54 10/28/2020   TRIG 93 10/28/2020   CHOLHDL 2.7 10/28/2020   Lab Results  Component Value Date   NA 142 02/23/2021   K 4.7 02/23/2021   CREATININE 0.74 02/23/2021   EGFR 86 02/23/2021   GLUCOSE 106 (H) 02/23/2021   TSH 2.220 10/28/2020     The ASCVD Risk score (Arnett DK, et al., 2019) failed to calculate for the following reasons:   The patient has a prior MI or stroke diagnosis  --------------------------------------------------------------------------------------------------- Prediabetes, Follow-up  Lab Results  Component Value Date   HGBA1C 5.8 (H) 02/23/2021   HGBA1C 5.9 (H) 10/28/2020   HGBA1C 5.9 (H) 08/30/2020   GLUCOSE 106 (H) 02/23/2021   GLUCOSE 101 (H) 10/28/2020   GLUCOSE 106 (H) 08/30/2020    Last seen for for this6 months ago.  Management since that visit includes; labs checked showing-stable.     Component Value Date/Time   CHOL 114 10/28/2020 0741   TRIG 93 10/28/2020 0741   CHOLHDL 2.7 10/28/2020 0741   CHOLHDL 3.5 08/30/2020 0551   CREATININE 0.74 02/23/2021 0852    Wt Readings from Last 3 Encounters:  02/23/21  199 lb (90.3 kg)  10/27/20 205 lb (93 kg)  10/17/20 208 lb 2 oz (94.4 kg)    -----------------------------------------------------------------------------------------   Medications: Outpatient Medications Prior to Visit  Medication Sig   amLODipine (NORVASC) 5 MG tablet TAKE 1 TABLET(5 MG) BY MOUTH DAILY   Azelaic Acid 15 % gel Apply a small amount to skin 1-2 times daily as needed.   benazepril (LOTENSIN) 40 MG tablet TAKE 1 TABLET(40 MG) BY MOUTH DAILY   bisoprolol-hydrochlorothiazide (ZIAC) 10-6.25 MG tablet TAKE 1 TABLET BY MOUTH DAILY   clopidogrel (PLAVIX) 75 MG tablet Take 1 tablet (75 mg total) by mouth daily.   omeprazole (PRILOSEC) 20 MG capsule Take 20 mg by mouth daily.   rosuvastatin (CRESTOR) 40 MG tablet TAKE 1 TABLET(40 MG) BY MOUTH DAILY   No facility-administered medications prior to visit.    Review of Systems  Constitutional:  Negative for appetite change, chills, fatigue and fever.  Respiratory:  Negative for chest tightness and shortness of breath.   Cardiovascular:  Negative for chest pain and palpitations.  Gastrointestinal:  Negative for abdominal pain, nausea and vomiting.  Neurological:  Negative for dizziness and weakness.    {Labs  Heme  Chem  Endocrine  Serology  Results Review (optional):23779}   Objective    There were no vitals taken for this visit. {Show previous vital signs (optional):23777}  Physical Exam  ***  No results   found for any visits on 08/27/21.  Assessment & Plan     ***  No follow-ups on file.      {provider attestation***:1}   Richard Gilbert Jr, MD  [facility] [phone] (phone) [phone] (fax)  [facility] 

## 2021-08-27 ENCOUNTER — Ambulatory Visit: Payer: Medicare Other | Admitting: Family Medicine

## 2021-08-27 DIAGNOSIS — I10 Essential (primary) hypertension: Secondary | ICD-10-CM

## 2021-08-27 DIAGNOSIS — R7303 Prediabetes: Secondary | ICD-10-CM

## 2021-09-10 ENCOUNTER — Ambulatory Visit (INDEPENDENT_AMBULATORY_CARE_PROVIDER_SITE_OTHER): Payer: Medicare Other | Admitting: Family Medicine

## 2021-09-10 VITALS — BP 127/76 | HR 55 | Temp 97.9°F | Wt 210.0 lb

## 2021-09-10 DIAGNOSIS — Z6837 Body mass index (BMI) 37.0-37.9, adult: Secondary | ICD-10-CM

## 2021-09-10 DIAGNOSIS — R7303 Prediabetes: Secondary | ICD-10-CM | POA: Diagnosis not present

## 2021-09-10 DIAGNOSIS — E538 Deficiency of other specified B group vitamins: Secondary | ICD-10-CM | POA: Diagnosis not present

## 2021-09-10 DIAGNOSIS — E7800 Pure hypercholesterolemia, unspecified: Secondary | ICD-10-CM

## 2021-09-10 DIAGNOSIS — E66812 Obesity, class 2: Secondary | ICD-10-CM

## 2021-09-10 NOTE — Progress Notes (Signed)
Argentina P[place],acting as a scribe for Wilhemena Durie, MD.,have documented all relevant documentation on the behalf of Wilhemena Durie, MD,as directed by  Wilhemena Durie, MD while in the presence of Wilhemena Durie, MD.     Established patient visit   Patient: Sharon Small   DOB: Jul 10, 1950   71 y.o. Female  MRN: [id number] Visit Date: 09/10/2021  Today's healthcare provider: Wilhemena Durie, MD   No chief complaint on file.  Subjective    HPI  Patient is enjoying retirement.  She is not having problems.  She feels well.  She is taking her medications.  Prediabetes, Follow-up  Lab Results  Component Value Date   HGBA1C 5.8 (H) 02/23/2021   HGBA1C 5.9 (H) 10/28/2020   HGBA1C 5.9 (H) 08/30/2020   GLUCOSE 106 (H) 02/23/2021   GLUCOSE 101 (H) 10/28/2020   GLUCOSE 106 (H) 08/30/2020    Last seen for for this6 months ago.  Management since that visit includes none.   Pertinent Labs:    Component Value Date/Time   CHOL 114 10/28/2020 0741   TRIG 93 10/28/2020 0741   CHOLHDL 2.7 10/28/2020 0741   CHOLHDL 3.5 08/30/2020 0551   CREATININE 0.74 02/23/2021 0852    Wt Readings from Last 3 Encounters:  09/10/21 210 lb (95.3 kg)  02/23/21 199 lb (90.3 kg)  10/27/20 205 lb (93 kg)    ----------------------------------------------------------------------------------------- Hypertension, follow-up  BP Readings from Last 3 Encounters:  09/10/21 127/76  02/23/21 128/82  10/27/20 112/81   Wt Readings from Last 3 Encounters:  09/10/21 210 lb (95.3 kg)  02/23/21 199 lb (90.3 kg)  10/27/20 205 lb (93 kg)     She was last seen for hypertension 6 months ago.  BP at that visit was as above. Management since that visit includes none.  Outside blood pressures are not being checked. Symptoms: No chest pain No chest pressure  No palpitations No syncope  No dyspnea No orthopnea  No paroxysmal nocturnal dyspnea No lower extremity edema   Pertinent  labs Lab Results  Component Value Date   CHOL 114 10/28/2020   HDL 42 10/28/2020   LDLCALC 54 10/28/2020   TRIG 93 10/28/2020   CHOLHDL 2.7 10/28/2020   Lab Results  Component Value Date   NA 142 02/23/2021   K 4.7 02/23/2021   CREATININE 0.74 02/23/2021   EGFR 86 02/23/2021   GLUCOSE 106 (H) 02/23/2021   TSH 2.220 10/28/2020     The ASCVD Risk score (Arnett DK, et al., 2019) failed to calculate for the following reasons:   The patient has a prior MI or stroke diagnosis  ---------------------------------------------------------------------------------------------------   Medications: Outpatient Medications Prior to Visit  Medication Sig   amLODipine (NORVASC) 5 MG tablet TAKE 1 TABLET(5 MG) BY MOUTH DAILY   Azelaic Acid 15 % gel Apply a small amount to skin 1-2 times daily as needed.   benazepril (LOTENSIN) 40 MG tablet TAKE 1 TABLET(40 MG) BY MOUTH DAILY   bisoprolol-hydrochlorothiazide (ZIAC) 10-6.25 MG tablet TAKE 1 TABLET BY MOUTH DAILY   clopidogrel (PLAVIX) 75 MG tablet Take 1 tablet (75 mg total) by mouth daily.   omeprazole (PRILOSEC) 20 MG capsule Take 20 mg by mouth daily.   rosuvastatin (CRESTOR) 40 MG tablet TAKE 1 TABLET(40 MG) BY MOUTH DAILY   No facility-administered medications prior to visit.    Review of Systems      Objective    BP 127/76 (BP Location: Right Arm, Patient  Position: Sitting, Cuff Size: Normal)   Pulse (!) 55   Temp 97.9 F (36.6 C) (Oral)   Wt 210 lb (95.3 kg)   SpO2 97%   BMI 33.89 kg/m     Physical Exam Vitals reviewed.  Constitutional:      General: She is not in acute distress.    Appearance: She is well-developed.  HENT:     Head: Normocephalic and atraumatic.     Right Ear: Hearing normal.     Left Ear: Hearing normal.     Nose: Nose normal.  Eyes:     General: Lids are normal. No scleral icterus.       Right eye: No discharge.        Left eye: No discharge.     Conjunctiva/sclera: Conjunctivae normal.   Cardiovascular:     Rate and Rhythm: Normal rate and regular rhythm.     Heart sounds: Normal heart sounds.  Pulmonary:     Effort: Pulmonary effort is normal. No respiratory distress.  Musculoskeletal:     Comments: Trace lower extremity edema  Skin:    General: Skin is dry.     Findings: No lesion or rash.  Neurological:     General: No focal deficit present.     Mental Status: She is alert and oriented to person, place, and time.  Psychiatric:        Mood and Affect: Mood normal.        Speech: Speech normal.        Behavior: Behavior normal.        Thought Content: Thought content normal.        Judgment: Judgment normal.       No results found for any visits on 09/10/21.  Assessment & Plan     1. Borderline diabetes  - Hemoglobin A1c  2. B12 deficiency   3. Pure hypercholesterolemia On rosuvastatin 40 due to history of previous CVA  4. Class 2 severe obesity with serious comorbidity and body mass index (BMI) of 37.0 to 37.9 in adult, unspecified obesity type ([place])  Diet and exercise stressed.  Follow-up in 6 months for physical   No follow-ups on file.      I, Wilhemena Durie, MD, have reviewed all documentation for this visit. The documentation on 09/10/21 for the exam, diagnosis, procedures, and orders are all accurate and complete.    Annai Heick Cranford Mon, MD  [facility] [phone] (phone) [phone] (fax)  [place]

## 2021-09-11 LAB — HEMOGLOBIN A1C
Est. average glucose Bld gHb Est-mCnc: 117 mg/dL
Hgb A1c MFr Bld: 5.7 % — ABNORMAL HIGH (ref 4.8–5.6)

## 2021-10-12 DIAGNOSIS — H40003 Preglaucoma, unspecified, bilateral: Secondary | ICD-10-CM | POA: Diagnosis not present

## 2021-11-01 DIAGNOSIS — S92354A Nondisplaced fracture of fifth metatarsal bone, right foot, initial encounter for closed fracture: Secondary | ICD-10-CM | POA: Diagnosis not present

## 2021-11-03 ENCOUNTER — Ambulatory Visit: Payer: Medicare Other

## 2021-11-12 ENCOUNTER — Telehealth: Payer: Self-pay | Admitting: Family Medicine

## 2021-11-12 DIAGNOSIS — S92354A Nondisplaced fracture of fifth metatarsal bone, right foot, initial encounter for closed fracture: Secondary | ICD-10-CM | POA: Diagnosis not present

## 2021-11-12 NOTE — Telephone Encounter (Signed)
Copied from [place] [phone]. Topic: Medicare AWV >> Nov 12, 2021  2:33 PM Jae Dire wrote: Reason for CRM:  Left message for patient to call back and schedule Medicare Annual Wellness Visit (AWV) in office.   If not able to come in the office, please offer to do virtually or by telephone.   No hx of AWV - AWV-I eligible per [facility] as of 07/16/2020  Please schedule at anytime with [facility] Health Advisor.   45 minute appointment  Any questions, please contact me at [phone]

## 2021-11-21 ENCOUNTER — Encounter: Payer: Self-pay | Admitting: Dermatology

## 2021-11-21 DIAGNOSIS — L719 Rosacea, unspecified: Secondary | ICD-10-CM

## 2021-11-23 ENCOUNTER — Ambulatory Visit: Payer: Medicare Other | Admitting: Cardiovascular Disease

## 2021-11-23 MED ORDER — AZELAIC ACID 15 % EX GEL: CUTANEOUS | 5 refills | Status: DC

## 2021-11-25 ENCOUNTER — Telehealth: Payer: Self-pay

## 2021-11-25 ENCOUNTER — Ambulatory Visit: Payer: Medicare Other

## 2021-11-25 NOTE — Telephone Encounter (Signed)
Copied from [place] [id number]. Topic: Appointment Scheduling - Scheduling Inquiry for Clinic >> Nov 25, 2021  8:56 AM Tiffany B wrote: Reason for CRM: Patient is displeased with the services she is receiving. She stated she had an 8:15 am AWV telephone appointment today and she did not receive a phone call. Patient states removing Dr. Rosanna Randy, not adhering to her appointment time is a reflection that something is wrong. Patient states if she was late for her appointment the same grace would not be given.   Can the practice admin reach out to the patient for service recovery please

## 2021-11-30 ENCOUNTER — Ambulatory Visit: Payer: Medicare Other | Admitting: Cardiovascular Disease

## 2021-12-18 NOTE — Telephone Encounter (Signed)
Unable to reach pt - Sharon Small

## 2022-01-06 ENCOUNTER — Telehealth: Payer: Self-pay | Admitting: Family Medicine

## 2022-01-06 MED ORDER — CLOPIDOGREL BISULFATE 75 MG PO TABS: 75.0000 mg | ORAL_TABLET | Freq: Every day | ORAL | 0 refills | Status: DC

## 2022-01-06 NOTE — Telephone Encounter (Signed)
[facility] faxed refill request for the following medications:   clopidogrel (PLAVIX) 75 MG tablet    Please advise.

## 2022-01-19 ENCOUNTER — Ambulatory Visit: Payer: Medicare Other | Admitting: Cardiovascular Disease

## 2022-02-23 ENCOUNTER — Encounter: Payer: Medicare Other | Admitting: Family Medicine

## 2022-02-24 NOTE — Progress Notes (Signed)
I,Sharon Small,acting as a scribe for [employer], MD.,have documented all relevant documentation on the behalf of Sharon Foster, MD,as directed by  Sharon Foster, MD while in the presence of Sharon Foster, MD.   Annual Wellness Visit     Patient: Sharon Small, Female    DOB: Dec 16, 1950, 72 y.o.   MRN: [id number] Visit Date: 02/25/2022  Today's Provider: Eulis Foster, MD   Chief Complaint  Patient presents with   Annual Exam   Subjective    Sharon Small is a 72 y.o. female who presents today for her Annual Wellness Visit.  She reports consuming a general diet. The patient does not participate in regular exercise at present. She generally feels well. She reports sleeping well. She does have additional problems to discuss today.   HPI  Knee Pain Problems - she would like to discuss with family and friends for recommendations regarding orthopedics referrals  Weakness in right LE- reports this has been present since stroke, would like to pursue PT once she has an idea of where she would like to go   Reports joint pains have increased since starting plavix - wants to start back on glucosamine supplements and wants to make sure this is ok  Breast CA screening: patient reports that she was told she needs mammogram ordered prior to scheduling   SOB with climbing stairs -Out of breath at top of stairs, reports that she has f/u with cardiologist in 1 week   Weight management: has been able to exercise in the past but reports having trouble with consistency, she states was previously successful with weight watchers, would like to try lifestyle modifications for weight management again, not interested in medication assisted weight loss at this time    Leg Swelling - Patient reports swelling in legs and enjoys popcorn with lots of salt - has been on amlodipine '5mg'$  since stroke, reports swelling decreases after having her  feet up overnight     Medications: Outpatient Medications Prior to Visit  Medication Sig   amLODipine (NORVASC) 5 MG tablet TAKE 1 TABLET(5 MG) BY MOUTH DAILY   Azelaic Acid 15 % gel Apply a small amount to skin 1-2 times daily as needed.   benazepril (LOTENSIN) 40 MG tablet TAKE 1 TABLET(40 MG) BY MOUTH DAILY   bisoprolol-hydrochlorothiazide (ZIAC) 10-6.25 MG tablet TAKE 1 TABLET BY MOUTH DAILY   clopidogrel (PLAVIX) 75 MG tablet Take 1 tablet (75 mg total) by mouth daily.   omeprazole (PRILOSEC) 20 MG capsule Take 20 mg by mouth daily.   rosuvastatin (CRESTOR) 40 MG tablet TAKE 1 TABLET(40 MG) BY MOUTH DAILY   triamcinolone cream (KENALOG) 0.5 % APPLY SMALL AMOUNT TOPICALLY TO THE AFFECTED AREA TWICE DAILY   [DISCONTINUED] amlodipine-atorvastatin (CADUET) 10-10 MG tablet    [DISCONTINUED] ipratropium (ATROVENT) 0.03 % nasal spray    [DISCONTINUED] molnupiravir EUA (LAGEVRIO) 200 MG CAPS capsule TAKE 4 CAPSULES (800 MG TOTAL) BY MOUTH TWICE A DAY FOR 5 DAYS   No facility-administered medications prior to visit.    No Known Allergies  Patient Care Team: Sharon Foster, MD as PCP - General (Family Medicine) Minna Merritts, MD as Consulting Physician (Cardiology)  Review of Systems  HENT:  Positive for sneezing.   Respiratory:  Positive for shortness of breath.   Musculoskeletal:  Positive for arthralgias and joint swelling.  All other systems reviewed and are negative.       Objective    Vitals: BP 128/79 (BP Location: Right  Arm, Patient Position: Sitting, Cuff Size: Large)   Pulse 87   Resp 16   Ht 5' 5.5" (1.664 m)   Wt 221 lb 9.6 oz (100.5 kg)   BMI 36.32 kg/m     Physical Exam Vitals reviewed.  Constitutional:      General: She is not in acute distress.    Appearance: Normal appearance. She is obese. She is not ill-appearing, toxic-appearing or diaphoretic.  HENT:     Head: Normocephalic and atraumatic.     Right Ear: Tympanic membrane and  external ear normal. There is no impacted cerumen.     Left Ear: Tympanic membrane and external ear normal. There is no impacted cerumen.     Nose: Nose normal.     Mouth/Throat:     Pharynx: Oropharynx is clear.  Eyes:     General: No scleral icterus.    Extraocular Movements: Extraocular movements intact.     Conjunctiva/sclera: Conjunctivae normal.     Pupils: Pupils are equal, round, and reactive to light.  Cardiovascular:     Rate and Rhythm: Normal rate and regular rhythm.     Pulses: Normal pulses.     Heart sounds: Normal heart sounds. No murmur heard.    No friction rub. No gallop.  Pulmonary:     Effort: Pulmonary effort is normal. No respiratory distress.     Breath sounds: Normal breath sounds. No wheezing, rhonchi or rales.  Abdominal:     General: Bowel sounds are normal. There is no distension.     Palpations: Abdomen is soft. There is no mass.     Tenderness: There is no abdominal tenderness. There is no guarding.  Musculoskeletal:        General: No deformity.     Cervical back: Normal range of motion and neck supple. No rigidity.     Right lower leg: Edema present.     Left lower leg: Edema present.     Comments: Trace bilateral edema   Lymphadenopathy:     Cervical: No cervical adenopathy.  Skin:    General: Skin is warm.     Capillary Refill: Capillary refill takes less than 2 seconds.     Findings: No erythema or rash.  Neurological:     General: No focal deficit present.     Mental Status: She is alert and oriented to person, place, and time.     Motor: No weakness.     Gait: Gait normal.  Psychiatric:        Mood and Affect: Mood normal.        Behavior: Behavior normal.     Most recent functional status assessment:    02/25/2022    9:13 AM  In your present state of health, do you have any difficulty performing the following activities:  Hearing? 0  Vision? 0  Difficulty concentrating or making decisions? 0  Walking or climbing stairs? 1   Dressing or bathing? 0  Doing errands, shopping? 0   Most recent fall risk assessment:    02/25/2022    9:11 AM  Fall Risk   Falls in the past year? 0  Number falls in past yr: 0  Injury with Fall? 0  Risk for fall due to : No Fall Risks    Most recent depression screenings:    02/25/2022    9:11 AM 01/03/2020   10:29 AM  PHQ 2/9 Scores  PHQ - 2 Score 0 0  PHQ- 9 Score 2 1  Most recent cognitive screening:     No data to display         Most recent Audit-C alcohol use screening    11/21/2021   10:54 AM  Alcohol Use Disorder Test (AUDIT)  1. How often do you have a drink containing alcohol? 0  3. How often do you have six or more drinks on one occasion? 0   A score of 3 or more in women, and 4 or more in men indicates increased risk for alcohol abuse, EXCEPT if all of the points are from question 1   No results found for any visits on 02/25/22.  Assessment & Plan     Annual wellness visit done today including the all of the following: Reviewed patient's Family Medical History Reviewed and updated list of patient's medical providers Assessment of cognitive impairment was done Assessed patient's functional ability Established a written schedule for health screening [place] Completed and Reviewed  Exercise Activities and Dietary recommendations  Goals   None     Immunization History  Administered Date(s) Administered   Fluad Quad(high Dose 65+) 12/11/2019   Influenza, High Dose Seasonal PF 11/16/2016, 11/03/2017, 10/25/2018   Influenza,inj,Quad PF,6+ Mos 11/17/2015   PFIZER(Purple Top)SARS-COV-2 Vaccination 04/09/2019, 04/30/2019   Pfizer Covid-19 Vaccine Bivalent Booster 49yr & up 01/16/2020   Pneumococcal Conjugate-13 11/16/2016   Pneumococcal Polysaccharide-23 01/05/2018   Tdap 11/23/2006   Zoster Recombinat (Shingrix) 11/03/2017, 01/05/2018   Zoster, Live 07/28/2011    Health Maintenance  Topic Date Due   COVID-19  Vaccine (4 - 2023-24 season) 03/13/2022 (Originally 10/16/2021)   MAMMOGRAM  03/10/2022   Medicare Annual Wellness (AWV)  02/26/2023   COLONOSCOPY (Pts 45-413yrInsurance coverage will need to be confirmed)  07/03/2025   Pneumonia Vaccine 6524Years old  Completed   INFLUENZA VACCINE  Completed   DEXA SCAN  Completed   Hepatitis C Screening  Completed   Zoster Vaccines- Shingrix  Completed   HPV VACCINES  Aged Out   DTaP/Tdap/Td  Discontinued     Discussed health benefits of physical activity, and encouraged her to engage in regular exercise appropriate for her age and condition.    Problem List Items Addressed This Visit       Cardiovascular and Mediastinum   Essential (primary) hypertension    Controlled BP at goal Continue current medications at current doses No medications changes today  CMP ordered today       Relevant Orders   Comprehensive metabolic panel   Magnesium     Hematopoietic and Hemostatic   Drug-induced thrombocytopenia    Patient on plavix s/p stroke  Will obtain CBC       Relevant Orders   CBC     Other   Borderline diabetes    Last A1c in preDM  range in July of 2023  Repeat A1c today       Relevant Orders   Hemoglobin A1c   HLD (hyperlipidemia)    Stable  Continue atorvastatin '40mg'$  daily  Repeat lipid panel ordered       Relevant Orders   Lipid panel   Encounter for annual wellness exam in Medicare patient - Primary    Breast cancer screening, mammogram ordered  A1c and CMP and lipid panel ordered today       Encounter for screening mammogram for malignant neoplasm of breast    Mammogram ordered  [facility]  Relevant Orders   MM 3D SCREEN BREAST BILATERAL     Return in about 3 months (around 05/27/2022) for preDM, weight management .     I, Sharon Foster, MD, have reviewed all documentation for this visit.  Portions of this  information were initially documented by the CMA and reviewed by me for thoroughness and accuracy.      Sharon Foster, MD  [facility] [phone] (phone) [phone] (fax)  [place]

## 2022-02-25 ENCOUNTER — Encounter: Payer: Self-pay | Admitting: Family Medicine

## 2022-02-25 ENCOUNTER — Ambulatory Visit (INDEPENDENT_AMBULATORY_CARE_PROVIDER_SITE_OTHER): Payer: Medicare Other | Admitting: Family Medicine

## 2022-02-25 VITALS — BP 128/79 | HR 87 | Resp 16 | Ht 65.5 in | Wt 221.6 lb

## 2022-02-25 DIAGNOSIS — E785 Hyperlipidemia, unspecified: Secondary | ICD-10-CM | POA: Diagnosis not present

## 2022-02-25 DIAGNOSIS — Z0000 Encounter for general adult medical examination without abnormal findings: Secondary | ICD-10-CM | POA: Diagnosis not present

## 2022-02-25 DIAGNOSIS — D6959 Other secondary thrombocytopenia: Secondary | ICD-10-CM

## 2022-02-25 DIAGNOSIS — T45525A Adverse effect of antithrombotic drugs, initial encounter: Secondary | ICD-10-CM

## 2022-02-25 DIAGNOSIS — R7303 Prediabetes: Secondary | ICD-10-CM | POA: Diagnosis not present

## 2022-02-25 DIAGNOSIS — I10 Essential (primary) hypertension: Secondary | ICD-10-CM

## 2022-02-25 DIAGNOSIS — T50905A Adverse effect of unspecified drugs, medicaments and biological substances, initial encounter: Secondary | ICD-10-CM

## 2022-02-25 DIAGNOSIS — E669 Obesity, unspecified: Secondary | ICD-10-CM

## 2022-02-25 DIAGNOSIS — Z1231 Encounter for screening mammogram for malignant neoplasm of breast: Secondary | ICD-10-CM | POA: Insufficient documentation

## 2022-02-25 DIAGNOSIS — Z6836 Body mass index (BMI) 36.0-36.9, adult: Secondary | ICD-10-CM

## 2022-02-25 DIAGNOSIS — E7800 Pure hypercholesterolemia, unspecified: Secondary | ICD-10-CM

## 2022-02-25 NOTE — Patient Instructions (Signed)
Health Maintenance After Age 73 After age 73, you are at a higher risk for certain long-term diseases and infections as well as injuries from falls. Falls are a major cause of broken bones and head injuries in people who are older than age 73. Getting regular preventive care can help to keep you healthy and well. Preventive care includes getting regular testing and making lifestyle changes as recommended by your health care provider. Talk with your health care provider about: Which screenings and tests you should have. A screening is a test that checks for a disease when you have no symptoms. A diet and exercise plan that is right for you. What should I know about screenings and tests to prevent falls? Screening and testing are the best ways to find a health problem early. Early diagnosis and treatment give you the best chance of managing medical conditions that are common after age 73. Certain conditions and lifestyle choices may make you more likely to have a fall. Your health care provider may recommend: Regular vision checks. Poor vision and conditions such as cataracts can make you more likely to have a fall. If you wear glasses, make sure to get your prescription updated if your vision changes. Medicine review. Work with your health care provider to regularly review all of the medicines you are taking, including over-the-counter medicines. Ask your health care provider about any side effects that may make you more likely to have a fall. Tell your health care provider if any medicines that you take make you feel dizzy or sleepy. Strength and balance checks. Your health care provider may recommend certain tests to check your strength and balance while standing, walking, or changing positions. Foot health exam. Foot pain and numbness, as well as not wearing proper footwear, can make you more likely to have a fall. Screenings, including: Osteoporosis screening. Osteoporosis is a condition that causes  the bones to get weaker and break more easily. Blood pressure screening. Blood pressure changes and medicines to control blood pressure can make you feel dizzy. Depression screening. You may be more likely to have a fall if you have a fear of falling, feel depressed, or feel unable to do activities that you used to do. Alcohol use screening. Using too much alcohol can affect your balance and may make you more likely to have a fall. Follow these instructions at home: Lifestyle Do not drink alcohol if: Your health care provider tells you not to drink. If you drink alcohol: Limit how much you have to: 0-1 drink a day for women. 0-2 drinks a day for men. Know how much alcohol is in your drink. In the [place] one drink equals one 12 oz bottle of beer (355 mL), one 5 oz glass of wine (148 mL), or one 1 oz glass of hard liquor (44 mL). Do not use any products that contain nicotine or tobacco. These products include cigarettes, chewing tobacco, and vaping devices, such as e-cigarettes. If you need help quitting, ask your health care provider. Activity  Follow a regular exercise program to stay fit. This will help you maintain your balance. Ask your health care provider what types of exercise are appropriate for you. If you need a cane or walker, use it as recommended by your health care provider. Wear supportive shoes that have nonskid soles. Safety  Remove any tripping hazards, such as rugs, cords, and clutter. Install safety equipment such as grab bars in bathrooms and safety rails on stairs. Keep rooms and walkways   well-lit. General instructions Talk with your health care provider about your risks for falling. Tell your health care provider if: You fall. Be sure to tell your health care provider about all falls, even ones that seem minor. You feel dizzy, tiredness (fatigue), or off-balance. Take over-the-counter and prescription medicines only as told by your health care provider. These include  supplements. Eat a healthy diet and maintain a healthy weight. A healthy diet includes low-fat dairy products, low-fat (lean) meats, and fiber from whole grains, beans, and lots of fruits and vegetables. Stay current with your vaccines. Schedule regular health, dental, and eye exams. Summary Having a healthy lifestyle and getting preventive care can help to protect your health and wellness after age 73. Screening and testing are the best way to find a health problem early and help you avoid having a fall. Early diagnosis and treatment give you the best chance for managing medical conditions that are more common for people who are older than age 73. Falls are a major cause of broken bones and head injuries in people who are older than age 73. Take precautions to prevent a fall at home. Work with your health care provider to learn what changes you can make to improve your health and wellness and to prevent falls. This information is not intended to replace advice given to you by your health care provider. Make sure you discuss any questions you have with your health care provider. Document Revised: 06/23/2020 Document Reviewed: 06/23/2020 Elsevier Patient Education  2023 [employer]vier Inc.  

## 2022-02-25 NOTE — Assessment & Plan Note (Signed)
Patient on plavix s/p stroke  Will obtain CBC

## 2022-02-25 NOTE — Assessment & Plan Note (Addendum)
Last A1c in preDM  range in July of 2023  Repeat A1c today

## 2022-02-26 NOTE — Assessment & Plan Note (Signed)
Controlled BP at goal Continue current medications at current doses No medications changes today  CMP ordered today

## 2022-02-26 NOTE — Assessment & Plan Note (Signed)
Mammogram ordered  Patient given card to schedule mammogram with [facility]

## 2022-02-26 NOTE — Assessment & Plan Note (Signed)
Breast cancer screening, mammogram ordered  A1c and CMP and lipid panel ordered today

## 2022-02-26 NOTE — Assessment & Plan Note (Signed)
Stable  Continue atorvastatin '40mg'$  daily  Repeat lipid panel ordered

## 2022-02-28 IMAGING — MG MM DIGITAL SCREENING BILAT W/ TOMO AND CAD
8 series · 8 of 24 positions shown · non-contrast
Comparison: Previous exam(s).

ACR Breast Density Category a: The breast tissue is almost entirely
fatty.

CLINICAL DATA: Screening.

EXAM:
DIGITAL SCREENING BILATERAL MAMMOGRAM WITH TOMOSYNTHESIS AND CAD
TECHNIQUE: Bilateral screening digital craniocaudal and mediolateral oblique
mammograms were obtained. Bilateral screening digital breast
tomosynthesis was performed. The images were evaluated with
computer-aided detection.

[R CC synth-2D]
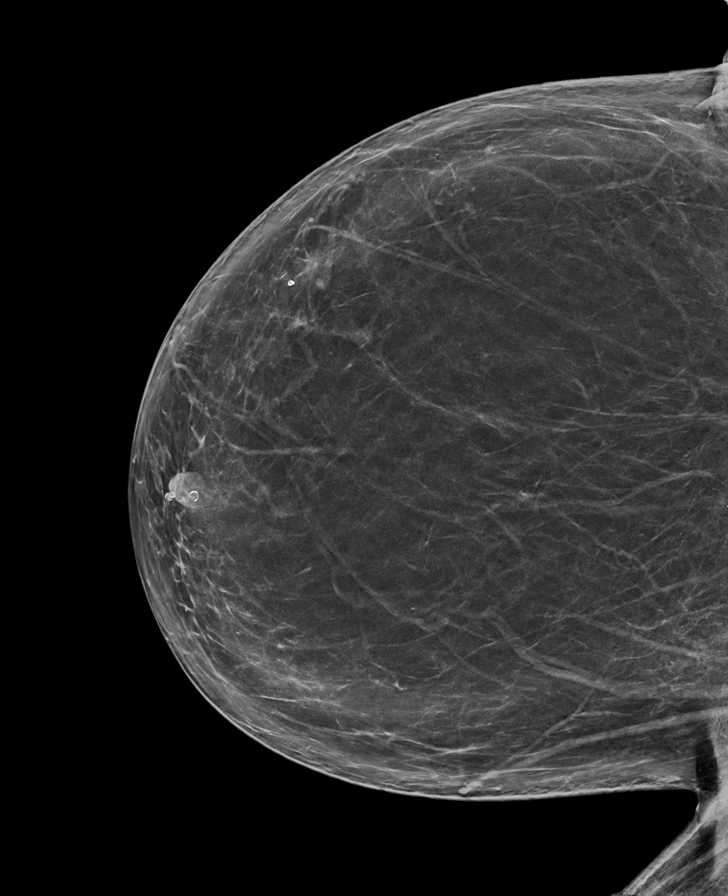

[L MLO synth-2D]
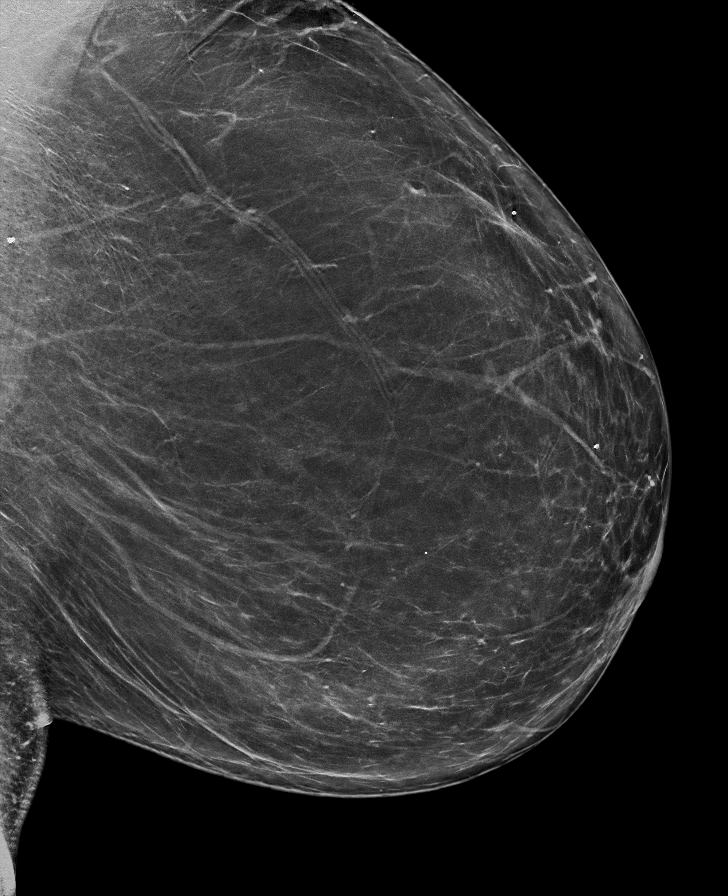

[R MLO synth-2D]
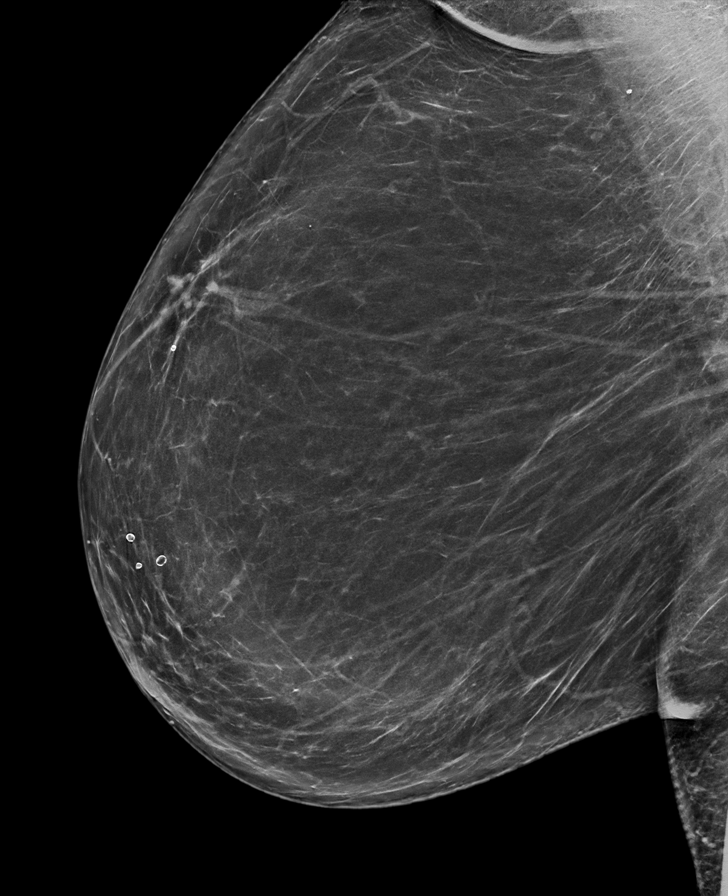

[L CC synth-2D]
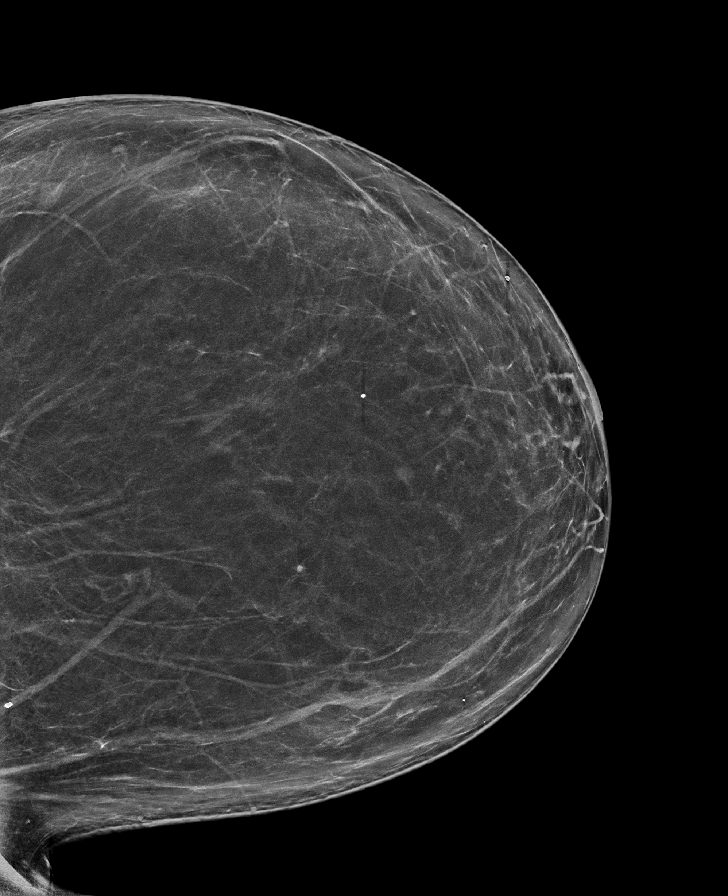

[R MLO tomo · tomo slice 43/86.0]
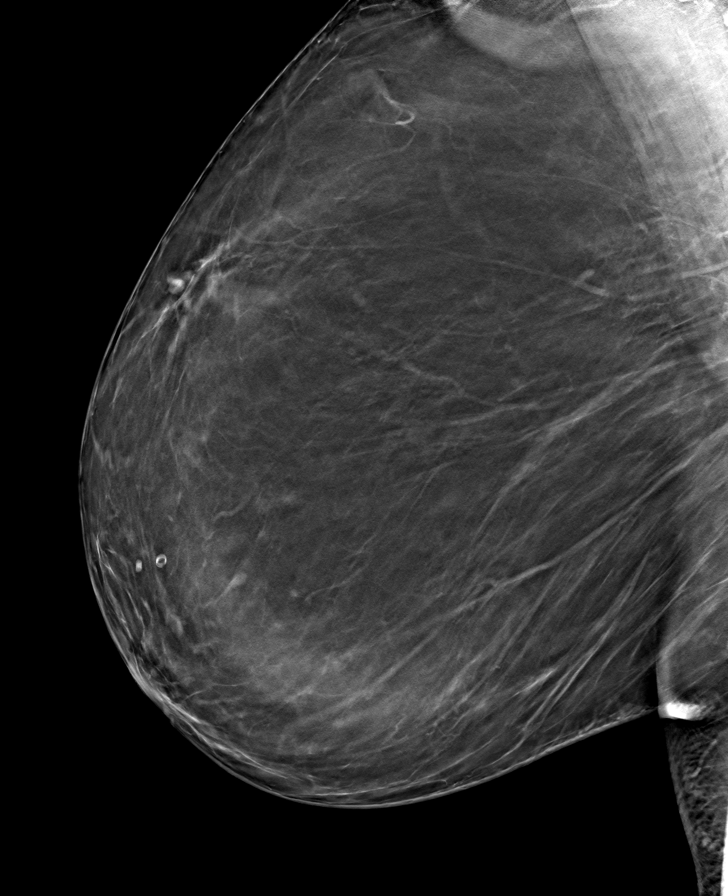

[L MLO tomo · tomo slice 43/85.0]
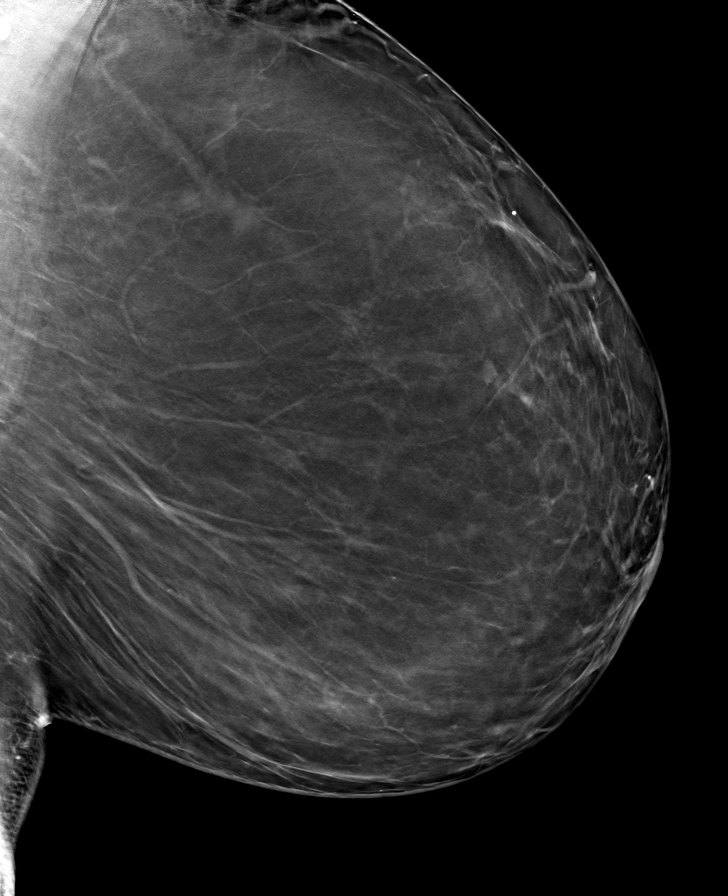

[L CC tomo · tomo slice 43/85.0]
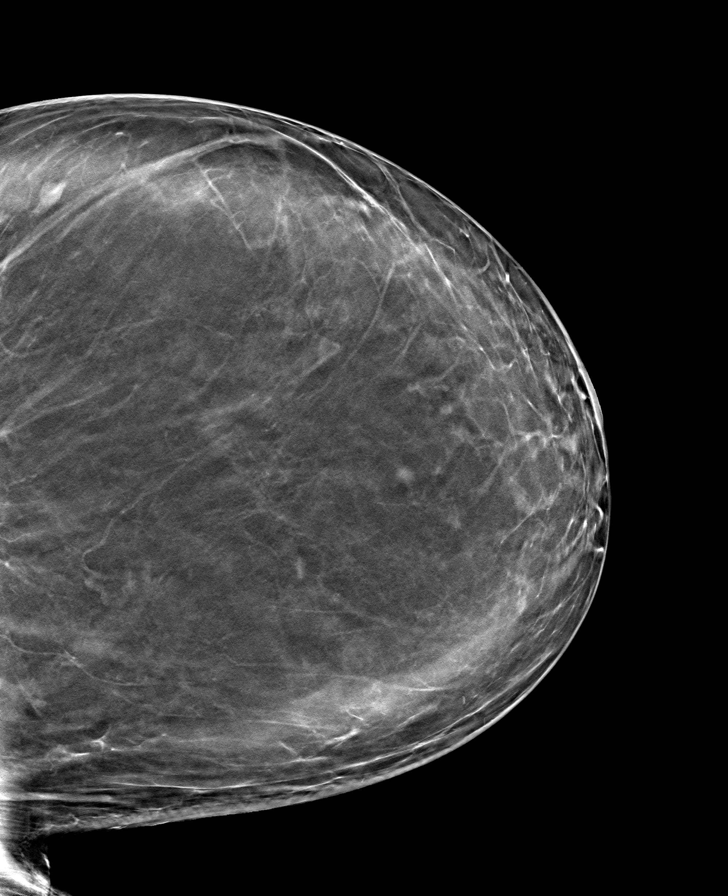

[R CC tomo · tomo slice 44/87.0]
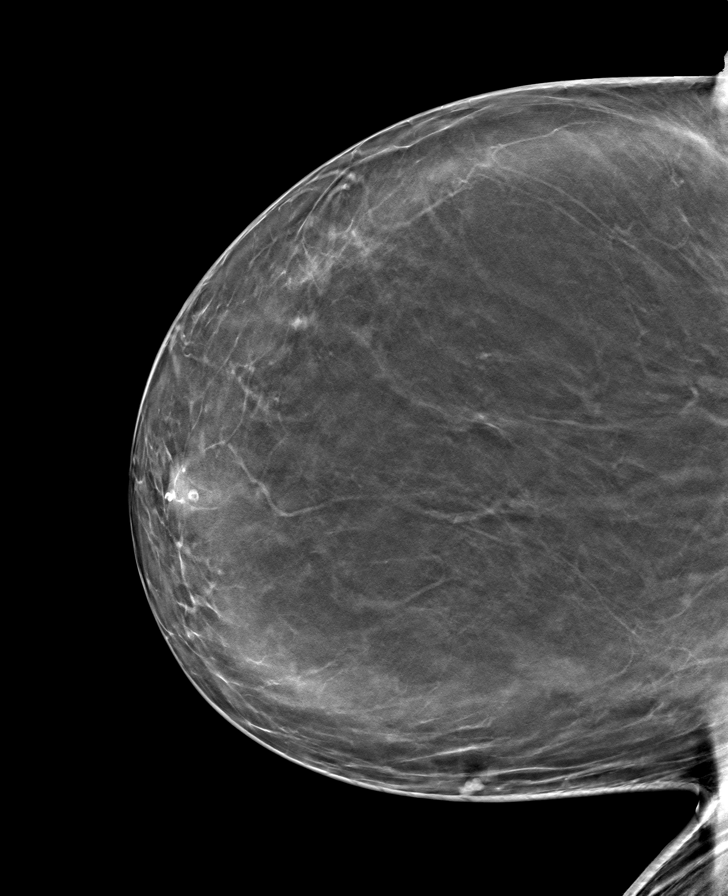

[8 of 24 positions shown; findings below may reference images not displayed]

FINDINGS: There are no findings suspicious for malignancy.
IMPRESSION: No mammographic evidence of malignancy. A result letter of this
screening mammogram will be mailed directly to the patient.

RECOMMENDATION:
Screening mammogram in one year. (Code:0E-3-N98)

BI-RADS CATEGORY  1: Negative.

## 2022-03-01 ENCOUNTER — Other Ambulatory Visit: Payer: Self-pay | Admitting: Family Medicine

## 2022-03-01 DIAGNOSIS — I10 Essential (primary) hypertension: Secondary | ICD-10-CM | POA: Diagnosis not present

## 2022-03-01 DIAGNOSIS — E7800 Pure hypercholesterolemia, unspecified: Secondary | ICD-10-CM | POA: Diagnosis not present

## 2022-03-01 DIAGNOSIS — T50905A Adverse effect of unspecified drugs, medicaments and biological substances, initial encounter: Secondary | ICD-10-CM | POA: Diagnosis not present

## 2022-03-01 DIAGNOSIS — R7303 Prediabetes: Secondary | ICD-10-CM | POA: Diagnosis not present

## 2022-03-01 DIAGNOSIS — D6959 Other secondary thrombocytopenia: Secondary | ICD-10-CM | POA: Diagnosis not present

## 2022-03-01 NOTE — Progress Notes (Unsigned)
Cardiology Office Note  Date:  03/02/2022   ID:  Sharon Small, DOB 17-Jun-1950, MRN [id number]  PCP:  Eulis Foster, MD   Chief Complaint  Patient presents with   12 month follow up     Patient c/o shortness of breath with over exertion. Medications reviewed by the patient verbally.     HPI:  72 year old female with strong family history of  coronary artery disease,  hypertension,  hyperlipidemia, PAD/mild carotid disease on the left She presents for follow-up of her history of CVA  Last seen in clinic by myself September 2022  Hurt foot sept 2023 SOB since sept 2023 Possibly with some worsening lower extremity edema  New atrial fibrillation today noted on EKG She has a pressure rated increased SOB on exertion  Previously tried CPAP, unable to tolerate Weight has trended upward 7 pounds over the past year Previously about Apple Watch to monitor heart rhythm, was too complicated and she sent it back  EKG personally reviewed by myself on todays visit Atrial fibrillation ventricular rate 86 bpm nonspecific ST abnormality  Previous diagnosis of stroke, leg weakness on right admitted to [facility] on 08/29/2020 and discharged on 09/01/2020. She was treated for CVA.   Presenting with right leg weakness and difficulty walking and feeling off balance.    CT scan of the head was unremarkable  MRI scan showed patchy left ACA infarct without hemorrhage.   CT angiogram of brain and neck showed no significant large vessel stenosis or occlusion.   2D echo showed ejection fraction of 55 to 60% without cardiac source of embolism.   Telemetry monitoring in the hospital did not show any paroxysmal A. fib.    LDL cholesterol was 85 mg percent and hemoglobin A1c was 5.9.    She was started on dual antiplatelet therapy aspirin and Plavix  Sx resolved that afternoon She has since stopped the aspirin, maintained on Plavix Has been evaluated by neurology, recommendation for a  ILD/loop   Outpatient placement of ZIO monitor Patient had a min HR of 42 bpm, max HR of 184 bpm, and avg HR of 58 bpm.  Predominant underlying rhythm was Sinus Rhythm.    1 run of Ventricular Tachycardia occurred lasting 5 beats with a max rate of 184 bpm (avg 140 bpm).    36 Supraventricular Tachycardia  runs occurred, the run with the fastest interval lasting 4 beats with a max rate of 156 bpm, the longest lasting 11.3 secs with an avg rate of 121 bpm.  Isolated SVEs were rare (<1.0%), SVE Couplets were rare (<1.0%), and SVE Triplets were rare (<1.0%).    Isolated VEs were rare (<1.0%), and no VE Couplets or VE Triplets were present.  No patient triggered events recorded  Echo reviewed  1. Left ventricular ejection fraction, by estimation, is 55 to 60%. The  left ventricle has normal function. The left ventricle has no regional  wall motion abnormalities. There is mild concentric left ventricular  hypertrophy. Left ventricular diastolic  parameters were normal.   2. Right ventricular systolic function is normal. The right ventricular  size is normal.   3. The mitral valve is normal in structure. No evidence of mitral valve  regurgitation. No evidence of mitral stenosis.   4. The aortic valve is normal in structure. There is mild calcification  of the aortic valve. Aortic valve regurgitation is not visualized. No  aortic stenosis is present.   5. The inferior vena cava is normal in size with greater  than 50%  respiratory variability, suggesting right atrial pressure of 3 mmHg.   Dad was smoker, died 81  PMH:   has a past medical history of CVA (cerebral vascular accident) ([place]), GERD (gastroesophageal reflux disease), Hypertension, and Snores.  PSH:    Past Surgical History:  Procedure Laterality Date   CATARACT EXTRACTION Bilateral 2003 & 2005   COLONOSCOPY     COLONOSCOPY WITH PROPOFOL N/A 07/04/2015   Procedure: COLONOSCOPY WITH PROPOFOL;  Surgeon: Lucilla Lame, MD;   Location: [place];  Service: Endoscopy;  Laterality: N/A;  prefers early   VAGINAL HYSTERECTOMY  2005   Bilateral oophorectomy    Current Outpatient Medications  Medication Sig Dispense Refill   amLODipine (NORVASC) 5 MG tablet TAKE 1 TABLET(5 MG) BY MOUTH DAILY 90 tablet 3   Azelaic Acid 15 % gel Apply a small amount to skin 1-2 times daily as needed. 30 g 5   benazepril (LOTENSIN) 40 MG tablet TAKE 1 TABLET(40 MG) BY MOUTH DAILY 90 tablet 3   bisoprolol-hydrochlorothiazide (ZIAC) 10-6.25 MG tablet TAKE 1 TABLET BY MOUTH DAILY 90 tablet 3   clopidogrel (PLAVIX) 75 MG tablet Take 1 tablet (75 mg total) by mouth daily. 90 tablet 0   omeprazole (PRILOSEC) 20 MG capsule Take 20 mg by mouth daily.     rosuvastatin (CRESTOR) 40 MG tablet TAKE 1 TABLET(40 MG) BY MOUTH DAILY 90 tablet 3   triamcinolone cream (KENALOG) 0.5 % APPLY SMALL AMOUNT TOPICALLY TO THE AFFECTED AREA TWICE DAILY (Patient not taking: Reported on 03/02/2022)     No current facility-administered medications for this visit.    Allergies:   Patient has no known allergies.   Social History:  The patient  reports that she has never smoked. She has never used smokeless tobacco. She reports that she does not drink alcohol and does not use drugs.   Family History:   family history includes Breast cancer (age of onset: 37) in her maternal aunt; COPD in her mother; Glaucoma in her sister; Heart disease in her father; Hypertension in her sister; Osteoporosis in her mother; Peripheral Artery Disease in her mother.    Review of Systems: Review of Systems  Constitutional: Negative.   HENT: Negative.    Respiratory:  Positive for cough and shortness of breath.   Cardiovascular:  Positive for leg swelling.  Gastrointestinal: Negative.   Musculoskeletal: Negative.   Neurological: Negative.   Psychiatric/Behavioral: Negative.    All other systems reviewed and are negative.    PHYSICAL EXAM: VS:  BP 100/60 (BP Location:  Left Arm, Patient Position: Sitting, Cuff Size: Large)   Pulse 86   Ht 5' 5.5" (1.664 m)   Wt 217 lb 2 oz (98.5 kg)   SpO2 97%   BMI 35.58 kg/m  , BMI Body mass index is 35.58 kg/m. Constitutional:  oriented to person, place, and time. No distress.  HENT:  Head: Grossly normal Eyes:  no discharge. No scleral icterus.  Neck: No JVD, no carotid bruits  Cardiovascular: Irregularly irregular no murmurs appreciated Pulmonary/Chest: Clear to auscultation bilaterally, no wheezes or rails Abdominal: Soft.  no distension.  no tenderness.  Musculoskeletal: Normal range of motion Neurological:  normal muscle tone. Coordination normal. No atrophy Skin: Skin warm and dry Psychiatric: normal affect, pleasant  Recent Labs: 03/01/2022: Hemoglobin 13.5; Magnesium 2.1; Platelets 276    Lipid Panel Lab Results  Component Value Date   CHOL 130 03/01/2022   HDL 57 03/01/2022   LDLCALC 52 03/01/2022  TRIG 116 03/01/2022      Wt Readings from Last 3 Encounters:  03/02/22 217 lb 2 oz (98.5 kg)  02/25/22 221 lb 9.6 oz (100.5 kg)  09/10/21 210 lb (95.3 kg)     ASSESSMENT AND PLAN:  New atrial fibrillation Noted on EKG, possibly started September 2023 in the setting of ankle injury as shortness of breath started around that time Mild symptoms Recommend she stop her ACE inhibitor and amlodipine, we will start diltiazem ER 120 daily Continue bisoprolol HCT We will start Eliquis 5 twice daily, stop Plavix Meet again in 1 month, if she remains in atrial fibrillation will consider cardioversion  Essential (primary) hypertension -  Medication changes as above  History of stroke Prior workup including Zio monitor, echo, carotid ultrasound Previously declined loop monitor,  will continue Crestor 40 mg daily Atrial fibrillation noted today on EKG, plan as above  Pure hypercholesterolemia Continue Crestor 40, numbers at goal  PAD/Carotid stenosis Mild disease on the left seen on CT scan  images Cholesterol at goal   Total encounter time more than 40 minutes  Greater than 50% was spent in counseling and coordination of care with the patient   No orders of the defined types were placed in this encounter.    Signed, Esmond Plants, M.D., Ph.D. 03/02/2022  [place], [place]

## 2022-03-02 ENCOUNTER — Telehealth: Payer: Self-pay

## 2022-03-02 ENCOUNTER — Ambulatory Visit: Payer: Medicare Other | Attending: Cardiovascular Disease | Admitting: Cardiovascular Disease

## 2022-03-02 ENCOUNTER — Telehealth: Payer: Self-pay | Admitting: Family Medicine

## 2022-03-02 ENCOUNTER — Encounter: Payer: Self-pay | Admitting: Cardiovascular Disease

## 2022-03-02 VITALS — BP 100/60 | HR 86 | Ht 65.5 in | Wt 217.1 lb

## 2022-03-02 DIAGNOSIS — I739 Peripheral vascular disease, unspecified: Secondary | ICD-10-CM

## 2022-03-02 DIAGNOSIS — I4891 Unspecified atrial fibrillation: Secondary | ICD-10-CM

## 2022-03-02 DIAGNOSIS — I639 Cerebral infarction, unspecified: Secondary | ICD-10-CM | POA: Diagnosis not present

## 2022-03-02 DIAGNOSIS — I10 Essential (primary) hypertension: Secondary | ICD-10-CM | POA: Diagnosis not present

## 2022-03-02 LAB — CBC
Hematocrit: 42.2 % (ref 34.0–46.6)
Hemoglobin: 13.5 g/dL (ref 11.1–15.9)
MCH: 29.7 pg (ref 26.6–33.0)
MCHC: 32 g/dL (ref 31.5–35.7)
MCV: 93 fL (ref 79–97)
Platelets: 276 10*3/uL (ref 150–450)
RBC: 4.55 x10E6/uL (ref 3.77–5.28)
RDW: 12.6 % (ref 11.7–15.4)
WBC: 5 10*3/uL (ref 3.4–10.8)

## 2022-03-02 LAB — LIPID PANEL
Chol/HDL Ratio: 2.3 ratio (ref 0.0–4.4)
Cholesterol, Total: 130 mg/dL (ref 100–199)
HDL: 57 mg/dL (ref >39)
LDL Chol Calc (NIH): 52 mg/dL (ref 0–99)
Triglycerides: 116 mg/dL (ref 0–149)
VLDL Cholesterol Cal: 21 mg/dL (ref 5–40)

## 2022-03-02 LAB — HEMOGLOBIN A1C
Est. average glucose Bld gHb Est-mCnc: 123 mg/dL
Hgb A1c MFr Bld: 5.9 % — ABNORMAL HIGH (ref 4.8–5.6)

## 2022-03-02 LAB — MAGNESIUM: Magnesium: 2.1 mg/dL (ref 1.6–2.3)

## 2022-03-02 MED ORDER — FUROSEMIDE 20 MG PO TABS: 20.0000 mg | ORAL_TABLET | Freq: Every day | ORAL | 1 refills | Status: DC | PRN

## 2022-03-02 MED ORDER — DILTIAZEM HCL ER COATED BEADS 120 MG PO CP24: 120.0000 mg | ORAL_CAPSULE | Freq: Every day | ORAL | 1 refills | Status: DC

## 2022-03-02 MED ORDER — APIXABAN 5 MG PO TABS: 5.0000 mg | ORAL_TABLET | Freq: Two times a day (BID) | ORAL | 1 refills | Status: DC

## 2022-03-02 NOTE — Telephone Encounter (Signed)
Pt calling and stating that she had come back in yesterday and had labs redone d/t on 02/25/22, pt had only CBC and other labs was needed so pt has gotten results in Mychart and noticed CMP was not on there and has appt with Cardiologist today. Spoke with Arbie Cookey, [facility] who states she was going to speak with lab tech and would call pt back. Advised pt of this and she verbalized understanding. No further assistance needed.

## 2022-03-02 NOTE — Telephone Encounter (Signed)
Called [facility] and specimen was added.

## 2022-03-02 NOTE — Telephone Encounter (Signed)
The lab didn't add the Comprehensive Metabolic Panel to her labs yesterday.   Can you call [facility] and have them today to add this please?  She is going to her cardiologist and this is one of the main test he needs.

## 2022-03-02 NOTE — Patient Instructions (Signed)
Medication Instructions:  Please stop plavix Please start eliquis 5 mg twice a day Medication Samples have been provided to the patient.  Medication Samples have been provided to the patient.  Drug name: Eliquis  Strength: 5 mg  Qty: 14 LOT: [id number]  Exp.Date: 07/2023  Dosing instructions: Take one tablet by mouth twice daily  Paytience Bures L Newcomer [place]ain 2:55 PM 03/02/2022  Hold the the benazepril and amlodipine Start diltiazem ER 120 mg daily  Lasix 20 mg daily as needed for swelling  If you need a refill on your cardiac medications before your next appointment, please call your pharmacy.   Lab work: No new labs needed  Testing/Procedures: No new testing needed  Follow-Up: At [facility], you and your health needs are our priority.  As part of our continuing mission to provide you with exceptional heart care, we have created designated Provider Care Teams.  These Care Teams include your primary Cardiologist (physician) and Advanced Practice Providers (APPs -  Physician Assistants and Nurse Practitioners) who all work together to provide you with the care you need, when you need it.  You will need a follow up appointment in 1 month  Providers on your designated Care Team:   Murray Hodgkins, NP Christell Faith, PA-C Cadence Kathlen Mody, [place]  COVID-19 Vaccine Information can be found at: [email or web address] For questions related to vaccine distribution or appointments, please email [email or web address] or call [phone].

## 2022-03-04 LAB — SPECIMEN STATUS REPORT

## 2022-03-04 LAB — COMPREHENSIVE METABOLIC PANEL
ALT: 21 IU/L (ref 0–32)
AST: 20 IU/L (ref 0–40)
Albumin/Globulin Ratio: 1.9 (ref 1.2–2.2)
Albumin: 4.7 g/dL (ref 3.8–4.8)
Alkaline Phosphatase: 64 IU/L (ref 44–121)
BUN/Creatinine Ratio: 23 (ref 12–28)
BUN: 18 mg/dL (ref 8–27)
Bilirubin Total: 0.4 mg/dL (ref 0.0–1.2)
CO2: 25 mmol/L (ref 20–29)
Calcium: 10 mg/dL (ref 8.7–10.3)
Chloride: 99 mmol/L (ref 96–106)
Creatinine, Ser: 0.77 mg/dL (ref 0.57–1.00)
Globulin, Total: 2.5 g/dL (ref 1.5–4.5)
Glucose: 91 mg/dL (ref 70–99)
Potassium: 5 mmol/L (ref 3.5–5.2)
Sodium: 140 mmol/L (ref 134–144)
Total Protein: 7.2 g/dL (ref 6.0–8.5)
eGFR: 82 mL/min/{1.73_m2} (ref >59)

## 2022-03-15 ENCOUNTER — Encounter: Payer: Self-pay | Admitting: Cardiovascular Disease

## 2022-03-17 ENCOUNTER — Ambulatory Visit
Admission: RE | Admit: 2022-03-17 | Discharge: 2022-03-17 | Disposition: A | Discharge status: Home / Self Care | Payer: Medicare Other | Source: Ambulatory Visit | Attending: Family Medicine | Admitting: Family Medicine

## 2022-03-17 DIAGNOSIS — Z1231 Encounter for screening mammogram for malignant neoplasm of breast: Secondary | ICD-10-CM | POA: Insufficient documentation

## 2022-03-18 MED ORDER — DILTIAZEM HCL ER COATED BEADS 120 MG PO CP24: 120.0000 mg | ORAL_CAPSULE | Freq: Two times a day (BID) | ORAL | 3 refills | Status: DC

## 2022-03-30 ENCOUNTER — Encounter: Payer: Medicare Other | Admitting: Dermatology

## 2022-04-06 ENCOUNTER — Encounter: Payer: Self-pay | Admitting: Dermatology

## 2022-04-06 ENCOUNTER — Ambulatory Visit: Payer: Medicare Other | Admitting: Dermatology

## 2022-04-06 VITALS — BP 120/76 | HR 72

## 2022-04-06 DIAGNOSIS — L820 Inflamed seborrheic keratosis: Secondary | ICD-10-CM | POA: Diagnosis not present

## 2022-04-06 DIAGNOSIS — D239 Other benign neoplasm of skin, unspecified: Secondary | ICD-10-CM

## 2022-04-06 DIAGNOSIS — L821 Other seborrheic keratosis: Secondary | ICD-10-CM

## 2022-04-06 DIAGNOSIS — L738 Other specified follicular disorders: Secondary | ICD-10-CM | POA: Diagnosis not present

## 2022-04-06 DIAGNOSIS — L918 Other hypertrophic disorders of the skin: Secondary | ICD-10-CM

## 2022-04-06 DIAGNOSIS — D225 Melanocytic nevi of trunk: Secondary | ICD-10-CM

## 2022-04-06 DIAGNOSIS — D229 Melanocytic nevi, unspecified: Secondary | ICD-10-CM

## 2022-04-06 DIAGNOSIS — L578 Other skin changes due to chronic exposure to nonionizing radiation: Secondary | ICD-10-CM | POA: Diagnosis not present

## 2022-04-06 DIAGNOSIS — L719 Rosacea, unspecified: Secondary | ICD-10-CM

## 2022-04-06 DIAGNOSIS — L814 Other melanin hyperpigmentation: Secondary | ICD-10-CM

## 2022-04-06 DIAGNOSIS — R202 Paresthesia of skin: Secondary | ICD-10-CM

## 2022-04-06 DIAGNOSIS — L817 Pigmented purpuric dermatosis: Secondary | ICD-10-CM

## 2022-04-06 DIAGNOSIS — Z1283 Encounter for screening for malignant neoplasm of skin: Secondary | ICD-10-CM | POA: Diagnosis not present

## 2022-04-06 DIAGNOSIS — D2361 Other benign neoplasm of skin of right upper limb, including shoulder: Secondary | ICD-10-CM

## 2022-04-06 NOTE — Patient Instructions (Addendum)
Notalgia paresthetica is a chronic condition affecting the skin of the back in which a pinched nerve along the spine causes itching or changes in sensation in an area of skin. This is usually accompanied by chronic rubbing or scratching often leaving the area of skin discolored and thickened. There is no cure, but there are some treatments which may help control the itch.   Over the counter (non-prescription) treatments for notalgia paresthetica include numbing creams like pramoxine or lidocaine which temporarily reduce itch or Capsaicin-containing creams which cause a burning sensation but which sometimes over time will reset the nerves to stop producing itch.  If you choose to use Capsaicin cream, it is recommended to use it 5 times daily for 1 week followed by 3 times daily for 3-6 weeks. You may have to continue using it long-term. - If not doing well with OTC options, could consider [employer] compounded prescription anti-itch cream with Amitriptyline 5% / Lidocaine 5% / Pramoxine 1% or Amitriptyline 5% / Gabapentin 10% / Lidocaine 5% Cream. For severe cases, there are some prescription cream or pill options which may help. Other treatment options include: - Transcutaneous Electrical Nerve Stimulation (TENS) - Gabapentin 300-900 mg daily po - Amitriptyline orally - Paravertebral local anesthetic block - intralesional Botulinum toxin A  Melanoma ABCDEs  Melanoma is the most dangerous type of skin cancer, and is the leading cause of death from skin disease.  You are more likely to develop melanoma if you: Have light-colored skin, light-colored eyes, or red or blond hair Spend a lot of time in the sun Tan regularly, either outdoors or in a tanning bed Have had blistering sunburns, especially during childhood Have a close family member who has had a melanoma Have atypical moles or large birthmarks  Early detection of melanoma is key since treatment is typically straightforward and cure  rates are extremely high if we catch it early.   The first sign of melanoma is often a change in a mole or a new dark spot.  The ABCDE system is a way of remembering the signs of melanoma.  A for asymmetry:  The two halves do not match. B for border:  The edges of the growth are irregular. C for color:  A mixture of colors are present instead of an even brown color. D for diameter:  Melanomas are usually (but not always) greater than 35m - the size of a pencil eraser. E for evolution:  The spot keeps changing in size, shape, and color.  Please check your skin once per month between visits. You can use a small mirror in front and a large mirror behind you to keep an eye on the back side or your body.   If you see any new or changing lesions before your next follow-up, please call to schedule a visit.  Please continue daily skin protection including broad spectrum sunscreen SPF 30+ to sun-exposed areas, reapplying every 2 hours as needed when you're outdoors.    Due to recent changes in healthcare laws, you may see results of your pathology and/or laboratory studies on MyChart before the doctors have had a chance to review them. We understand that in some cases there may be results that are confusing or concerning to you. Please understand that not all results are received at the same time and often the doctors may need to interpret multiple results in order to provide you with the best plan of care or course of treatment. Therefore, we ask that you  please give [place] 2 business days to thoroughly review all your results before contacting the office for clarification. Should we see a critical lab result, you will be contacted sooner.   If You Need Anything After Your Visit  If you have any questions or concerns for your doctor, please call our main line at [phone] and press option 4 to reach your doctor's medical assistant. If no one answers, please leave a voicemail as directed and we will  return your call as soon as possible. Messages left after 4 pm will be answered the following business day.   You may also send [place] a message via [place]. We typically respond to MyChart messages within 1-2 business days.  For prescription refills, please ask your pharmacy to contact our office. Our fax number is [phone].  If you have an urgent issue when the clinic is closed that cannot wait until the next business day, you can page your doctor at the number below.    Please note that while we do our best to be available for urgent issues outside of office hours, we are not available 24/7.   If you have an urgent issue and are unable to reach [place], you may choose to seek medical care at your doctor's office, retail clinic, urgent care center, or emergency room.  If you have a medical emergency, please immediately call 911 or go to the emergency department.  Pager Numbers  - Dr. Nehemiah Massed: [phone]  - Dr. Laurence Ferrari: [phone]  - Dr. Nicole Kindred: [phone]  In the event of inclement weather, please call our main line at [phone] for an update on the status of any delays or closures.  Dermatology Medication Tips: Please keep the boxes that topical medications come in in order to help keep track of the instructions about where and how to use these. Pharmacies typically print the medication instructions only on the boxes and not directly on the medication tubes.   If your medication is too expensive, please contact our office at [phone] option 4 or send [place] a message through [place].   We are unable to tell what your co-pay for medications will be in advance as this is different depending on your insurance coverage. However, we may be able to find a substitute medication at lower cost or fill out paperwork to get insurance to cover a needed medication.   If a prior authorization is required to get your medication covered by your insurance company, please allow [place] 1-2 business  days to complete this process.  Drug prices often vary depending on where the prescription is filled and some pharmacies may offer cheaper prices.  The website www.goodrx.com contains coupons for medications through different pharmacies. The prices here do not account for what the cost may be with help from insurance (it may be cheaper with your insurance), but the website can give you the price if you did not use any insurance.  - You can print the associated coupon and take it with your prescription to the pharmacy.  - You may also stop by our office during regular business hours and pick up a GoodRx coupon card.  - If you need your prescription sent electronically to a different pharmacy, notify our office through [facility] or by phone at [phone] option 4.     Si Usted Necesita Algo Despus de Su Visita  Tambin puede enviarnos un mensaje a travs de Pharmacist, community. Por lo general respondemos a los mensajes de MyChart en el transcurso de  1 a 2 das hbiles.  Para renovar recetas, por favor pida a su farmacia que se ponga en contacto con nuestra oficina. Harland Dingwall de fax es [place] [phone].  Si tiene un asunto urgente cuando la clnica est cerrada y que no puede esperar hasta el siguiente da hbil, puede llamar/localizar a su doctor(a) al nmero que aparece a continuacin.   Por favor, tenga en cuenta que aunque hacemos todo lo posible para estar disponibles para asuntos urgentes fuera del horario de [place], no estamos disponibles las 24 horas del da, los 7 das de la [place].   Si tiene un problema urgente y no puede comunicarse con nosotros, puede optar por buscar atencin mdica  en el consultorio de su doctor(a), en una clnica privada, en un centro de atencin urgente o en una sala de emergencias.  Si tiene Engineering geologist, por favor llame inmediatamente al 911 o vaya a la sala de emergencias.  Nmeros de bper  - Dr. Nehemiah Massed: [phone]  - Dra. Moye:  [phone]  - Dra. Nicole Kindred: [phone]  En caso de inclemencias del [place], por favor llame a Johnsie Kindred principal al [phone] para una actualizacin sobre el [place] de cualquier retraso o cierre.  Consejos para la medicacin en dermatologa: Por favor, guarde las cajas en las que vienen los medicamentos de uso tpico para ayudarle a seguir las instrucciones sobre dnde y cmo usarlos. Las farmacias generalmente imprimen las instrucciones del medicamento slo en las cajas y no directamente en los tubos del [place].   Si su medicamento es muy caro, por favor, pngase en contacto con Zigmund Daniel llamando al [phone] y presione la opcin 4 o envenos un mensaje a travs de Pharmacist, community.   No podemos decirle cul ser su copago por los medicamentos por adelantado ya que esto es diferente dependiendo de la cobertura de su seguro. Sin embargo, es posible que podamos encontrar un medicamento sustituto a Electrical engineer un formulario para que el seguro cubra el medicamento que se considera necesario.   Si se requiere una autorizacin previa para que su compaa de seguros [place] su medicamento, por favor permtanos de 1 a 2 das hbiles para completar este proceso.  Los precios de los medicamentos varan con frecuencia dependiendo del Environmental consultant de dnde se surte la receta y alguna farmacias pueden ofrecer precios ms baratos.  El sitio web www.goodrx.com tiene cupones para medicamentos de Airline pilot. Los precios aqu no tienen en cuenta lo que podra costar con la ayuda del seguro (puede ser ms barato con su seguro), pero el sitio web puede darle el precio si no utiliz Research scientist (physical sciences).  - Puede imprimir el cupn correspondiente y llevarlo con su receta a la farmacia.  - Tambin puede pasar por nuestra oficina durante el horario de atencin regular y Charity fundraiser una tarjeta de cupones de GoodRx.  - Si necesita que su receta se enve electrnicamente a una farmacia diferente,  informe a nuestra oficina a travs de MyChart de [place] o por telfono llamando al [phone] y presione la opcin 4.

## 2022-04-06 NOTE — Progress Notes (Signed)
Follow-Up Visit   Subjective  Sharon Small is a 72 y.o. female who presents for the following: TBSE (No hx skin cancer. ) and Rosacea (Patient using azelaic acid twice daily and feels it is controlled. ). Has itchy spot on back.  Has irritated growths on upper eyelids, getting larger.  Has discoloration on lower legs.  The patient presents for Total-Body Skin Exam (TBSE) for skin cancer screening and mole check.  The patient has spots, moles and lesions to be evaluated, some may be new or changing and the patient has concerns that these could be cancer.   The following portions of the chart were reviewed this encounter and updated as appropriate:       Review of Systems:  No other skin or systemic complaints except as noted in HPI or Assessment and Plan.  Objective  Well appearing patient in no apparent distress; mood and affect are within normal limits.  A full examination was performed including scalp, head, eyes, ears, nose, lips, neck, chest, axillae, abdomen, back, buttocks, bilateral upper extremities, bilateral lower extremities, hands, feet, fingers, toes, fingernails, and toenails. All findings within normal limits unless otherwise noted below.  spinal lower back 6.0 mm tan papule   forehead, cheeks Small yellow papules with a central dell.    left infraocular 2.64m firm blue papule   upper eyelids (3) Erythematous stuck-on, waxy papule  Right Spinal Mid Back Clear with h/o itch  b/l lower legs Cayenne pepper macules   Nose Mild mid face erythema    Assessment & Plan  Nevus spinal lower back  Benign-appearing. Stable compared to previous visit. Observation.  Call clinic for new or changing moles.  Recommend daily use of broad spectrum spf 30+ sunscreen to sun-exposed areas.    Sebaceous hyperplasia forehead, cheeks  Benign, observe.  Discussed ED to remove, noncovered procedure  Blue nevus left infraocular  Vs Cyst  Benign-appearing.  Stable compared to previous visit. Observation.  Call clinic for new or changing moles.  Recommend daily use of broad spectrum spf 30+ sunscreen to sun-exposed areas.    Inflamed seborrheic keratosis (3) upper eyelids  Symptomatic, irritating, patient would like treated.   Destruction of lesion - upper eyelids  Destruction method: cryotherapy   Informed consent: discussed and consent obtained   Lesion destroyed using liquid nitrogen: Yes   Region frozen until ice ball extended beyond lesion: Yes   Outcome: patient tolerated procedure well with no complications   Post-procedure details: wound care instructions given   Additional details:  Prior to procedure, discussed risks of blister formation, small wound, skin dyspigmentation, or rare scar following cryotherapy. Recommend Vaseline ointment to treated areas while healing.   Notalgia paresthetica Right Spinal Mid Back  Notalgia paresthetica is a chronic condition affecting the skin of the back in which a pinched nerve along the spine causes itching or changes in sensation in an area of skin. This is usually accompanied by chronic rubbing or scratching often leaving the area of skin discolored and thickened. There is no cure, but there are some treatments which may help control the itch.   Over the counter (non-prescription) treatments for notalgia paresthetica include numbing creams like pramoxine or lidocaine which temporarily reduce itch or Capsaicin-containing creams which cause a burning sensation but which sometimes over time will reset the nerves to stop producing itch.  If you choose to use Capsaicin cream, it is recommended to use it 5 times daily for 1 week followed by 3 times daily  for 3-6 weeks. You may have to continue using it long-term. - If not doing well with OTC options, could consider Skin Medicinals compounded prescription anti-itch cream with Amitriptyline 5% / Lidocaine 5% / Pramoxine 1% or Amitriptyline 5% / Gabapentin  10% / Lidocaine 5% Cream. For severe cases, there are some prescription cream or pill options which may help. Other treatment options include: - Transcutaneous Electrical Nerve Stimulation (TENS) - Gabapentin 300-900 mg daily po - Amitriptyline orally - Paravertebral local anesthetic block - intralesional Botulinum toxin A   Schamberg's purpura b/l lower legs  Benign, observe.  Likely related to chronic lower leg swelling  Stasis in the legs causes chronic leg swelling, which may result in itchy or painful rashes, skin discoloration, skin texture changes, and sometimes ulceration.  Recommend daily graduated compression hose/stockings- easiest to put on first thing in morning, remove at bedtime.  Elevate legs as much as possible. Avoid salt/sodium rich foods.   Rosacea Nose  Chronic condition with duration or expected duration over one year. Currently well-controlled.   Rosacea is a chronic progressive skin condition usually affecting the face of adults, causing redness and/or acne bumps. It is treatable but not curable. It sometimes affects the eyes (ocular rosacea) as well. It may respond to topical and/or systemic medication and can flare with stress, sun exposure, alcohol, exercise, topical steroids (including hydrocortisone/cortisone 10) and some foods.  Daily application of broad spectrum spf 30+ sunscreen to face is recommended to reduce flares.  Continue Finacea gel qd/bid to face  Related Medications Azelaic Acid 15 % gel Apply a small amount to skin 1-2 times daily as needed.   Lentigines - Scattered tan macules - Due to sun exposure - Benign-appearing, observe - Recommend daily broad spectrum sunscreen SPF 30+ to sun-exposed areas, reapply every 2 hours as needed. - Call for any changes  Seborrheic Keratoses - Stuck-on, waxy, tan-brown papules and/or plaques  - Benign-appearing - Discussed benign etiology and prognosis. - Observe - Call for any  changes  Melanocytic Nevi - Tan-brown and/or pink-flesh-colored symmetric macules and papules - Benign appearing on exam today - Observation - Call clinic for new or changing moles - Recommend daily use of broad spectrum spf 30+ sunscreen to sun-exposed areas.   Hemangiomas - Red papules - Discussed benign nature - Observe - Call for any changes  Actinic Damage - Chronic condition, secondary to cumulative UV/sun exposure - diffuse scaly erythematous macules with underlying dyspigmentation - Recommend daily broad spectrum sunscreen SPF 30+ to sun-exposed areas, reapply every 2 hours as needed.  - Staying in the shade or wearing long sleeves, sun glasses (UVA+UVB protection) and wide brim hats (4-inch brim around the entire circumference of the hat) are also recommended for sun protection.  - Call for new or changing lesions.  Skin cancer screening performed today.  Acrochordons (Skin Tags) - Fleshy, skin-colored pedunculated papules - Benign appearing.  - Observe. - If desired, they can be removed with an in office procedure that is not covered by insurance. - Please call the clinic if you notice any new or changing lesions.  Dermatofibroma - Firm pink/brown papulenodule with dimple sign at right upper arm - Benign appearing - Call for any changes  Return in about 1 year (around 04/07/2023) for TBSE, Rosacea.  Graciella Belton, RMA, am acting as scribe for Brendolyn Patty, MD .  Documentation: I have reviewed the above documentation for accuracy and completeness, and I agree with the above.  Brendolyn Patty MD

## 2022-04-07 ENCOUNTER — Encounter: Payer: Self-pay | Admitting: Cardiovascular Disease

## 2022-04-07 ENCOUNTER — Ambulatory Visit: Payer: Medicare Other | Attending: Cardiovascular Disease | Admitting: Cardiovascular Disease

## 2022-04-07 VITALS — BP 130/80 | HR 75 | Ht 65.5 in | Wt 213.5 lb

## 2022-04-07 DIAGNOSIS — I639 Cerebral infarction, unspecified: Secondary | ICD-10-CM | POA: Diagnosis not present

## 2022-04-07 DIAGNOSIS — E7800 Pure hypercholesterolemia, unspecified: Secondary | ICD-10-CM

## 2022-04-07 DIAGNOSIS — R7303 Prediabetes: Secondary | ICD-10-CM | POA: Diagnosis not present

## 2022-04-07 DIAGNOSIS — I10 Essential (primary) hypertension: Secondary | ICD-10-CM

## 2022-04-07 DIAGNOSIS — I4819 Other persistent atrial fibrillation: Secondary | ICD-10-CM

## 2022-04-07 MED ORDER — BENAZEPRIL HCL 20 MG PO TABS: 20.0000 mg | ORAL_TABLET | Freq: Every day | ORAL | 3 refills | Status: DC

## 2022-04-07 MED ORDER — FLECAINIDE ACETATE 100 MG PO TABS: 100.0000 mg | ORAL_TABLET | Freq: Two times a day (BID) | ORAL | 3 refills | Status: DC

## 2022-04-07 NOTE — Progress Notes (Signed)
Cardiology Office Note  Date:  04/07/2022   ID:  [name], DOB 1950/06/17, MRN [id number]  PCP:  Sharon Foster, MD   Chief Complaint  Patient presents with   1 month follow up     Patient c/o elevated blood pressure. Medications reviewed by the patient verbally.     HPI:  72 year old female with past medical history of hypertension,  hyperlipidemia, PAD/mild carotid disease on the left She presents for follow-up of her history of CVA, persistent atrial fibrillation  Last seen in clinic by myself January 2024 Noted to be in atrial fibrillation on that visit with shortness of breath starting September 2023 Started on diltiazem ER 120, continued on bisoprolol HCT BP elevated, increase up to diltiazem BID  In follow-up today reports blood pressure continues to run mildly high often [id number] systolic range over 90  Very mild shortness of breath though feels this is somewhat improved Denies significant abdominal distention, lower extremity edema, orthopnea  Using a Kardia mobile device at home, reading predominantly " error" or cannot rule out A-fib.  Not reading normal  EKG personally reviewed by myself on todays visit Atrial fibrillation with rate 75 bpm nonspecific ST abnormality  Other past medical history reviewed Hurt foot sept 2023 SOB since sept 2023 Atrial fibrillation noted on office visit January 2024  Previous diagnosis of stroke, leg weakness on right admitted to [facility] on 08/29/2020 and discharged on 09/01/2020. She was treated for CVA.   Presenting with right leg weakness and difficulty walking and feeling off balance.    CT scan of the head was unremarkable  MRI scan showed patchy left ACA infarct without hemorrhage.   CT angiogram of brain and neck showed no significant large vessel stenosis or occlusion.   2D echo showed ejection fraction of 55 to 60% without cardiac source of embolism.   Telemetry monitoring in the hospital did not show any  paroxysmal A. fib.    LDL cholesterol was 85 mg percent and hemoglobin A1c was 5.9.    She was started on dual antiplatelet therapy aspirin and Plavix  Sx resolved that afternoon maintained on Plavix  Outpatient placement of ZIO monitor Patient had a min HR of 42 bpm, max HR of 184 bpm, and avg HR of 58 bpm.  Predominant underlying rhythm was Sinus Rhythm.    1 run of Ventricular Tachycardia occurred lasting 5 beats with a max rate of 184 bpm (avg 140 bpm).    36 Supraventricular Tachycardia  runs occurred, the run with the fastest interval lasting 4 beats with a max rate of 156 bpm, the longest lasting 11.3 secs with an avg rate of 121 bpm.  Isolated SVEs were rare (<1.0%), SVE Couplets were rare (<1.0%), and SVE Triplets were rare (<1.0%).    Isolated VEs were rare (<1.0%), and no VE Couplets or VE Triplets were present.  No patient triggered events recorded  Echo reviewed  1. Left ventricular ejection fraction, by estimation, is 55 to 60%. The  left ventricle has normal function. The left ventricle has no regional  wall motion abnormalities. There is mild concentric left ventricular  hypertrophy. Left ventricular diastolic  parameters were normal.   2. Right ventricular systolic function is normal. The right ventricular  size is normal.   3. The mitral valve is normal in structure. No evidence of mitral valve  regurgitation. No evidence of mitral stenosis.   4. The aortic valve is normal in structure. There is mild calcification  of the aortic  valve. Aortic valve regurgitation is not visualized. No  aortic stenosis is present.   5. The inferior vena cava is normal in size with greater than 50%  respiratory variability, suggesting right atrial pressure of 3 mmHg.   Dad was smoker, died 60  PMH:   has a past medical history of CVA (cerebral vascular accident) ([place]), GERD (gastroesophageal reflux disease), Hypertension, and Snores.  PSH:    Past Surgical History:  Procedure  Laterality Date   CATARACT EXTRACTION Bilateral 2003 & 2005   COLONOSCOPY     COLONOSCOPY WITH PROPOFOL N/A 07/04/2015   Procedure: COLONOSCOPY WITH PROPOFOL;  Surgeon: Sharon Lame, MD;  Location: [facility];  Service: Endoscopy;  Laterality: N/A;  prefers early   VAGINAL HYSTERECTOMY  2005   Bilateral oophorectomy    Current Outpatient Medications  Medication Sig Dispense Refill   apixaban (ELIQUIS) 5 MG TABS tablet Take 1 tablet (5 mg total) by mouth 2 (two) times daily. 60 tablet 1   Azelaic Acid 15 % gel Apply a small amount to skin 1-2 times daily as needed. 30 g 5   bisoprolol-hydrochlorothiazide (ZIAC) 10-6.25 MG tablet TAKE 1 TABLET BY MOUTH DAILY 90 tablet 3   diltiazem (CARDIZEM CD) 120 MG 24 hr capsule Take 1 capsule (120 mg total) by mouth 2 (two) times daily. 180 capsule 3   furosemide (LASIX) 20 MG tablet Take 1 tablet (20 mg total) by mouth daily as needed. 30 tablet 1   Glucosamine HCl (GLUCOSAMINE PO) Take by mouth daily.     MAGNESIUM PO Take by mouth daily as needed. For muscle spasms     Multiple Vitamin (MULTIVITAMIN ADULT PO) Take 1 tablet by mouth daily.     omeprazole (PRILOSEC) 20 MG capsule Take 20 mg by mouth daily.     rosuvastatin (CRESTOR) 40 MG tablet TAKE 1 TABLET(40 MG) BY MOUTH DAILY 90 tablet 3   triamcinolone cream (KENALOG) 0.5 % APPLY SMALL AMOUNT TOPICALLY TO THE AFFECTED AREA TWICE DAILY (Patient not taking: Reported on 03/02/2022)     No current facility-administered medications for this visit.    Allergies:   Patient has no known allergies.   Social History:  The patient  reports that she has never smoked. She has never used smokeless tobacco. She reports that she does not drink alcohol and does not use drugs.   Family History:   family history includes Breast cancer (age of onset: 55) in her maternal aunt; COPD in her mother; Glaucoma in her sister; Heart disease in her father; Hypertension in her sister; Osteoporosis in her mother;  Peripheral Artery Disease in her mother.    Review of Systems: Review of Systems  Constitutional: Negative.   HENT: Negative.    Respiratory: Negative.    Cardiovascular: Negative.   Gastrointestinal: Negative.   Musculoskeletal: Negative.   Neurological: Negative.   Psychiatric/Behavioral: Negative.    All other systems reviewed and are negative.   PHYSICAL EXAM: VS:  BP 130/80 (BP Location: Left Arm, Patient Position: Sitting, Cuff Size: Large)   Pulse 75   Ht 5' 5.5" (1.664 m)   Wt 213 lb 8 oz (96.8 kg)   SpO2 98%   BMI 34.99 kg/m  , BMI Body mass index is 34.99 kg/m. Constitutional:  oriented to person, place, and time. No distress.  HENT:  Head: Grossly normal Eyes:  no discharge. No scleral icterus.  Neck: No JVD, no carotid bruits  Cardiovascular: Regular rate and rhythm, no murmurs appreciated Pulmonary/Chest: Clear  to auscultation bilaterally, no wheezes or rails Abdominal: Soft.  no distension.  no tenderness.  Musculoskeletal: Normal range of motion Neurological:  normal muscle tone. Coordination normal. No atrophy Skin: Skin warm and dry Psychiatric: normal affect, pleasant  Recent Labs: 03/01/2022: ALT 21; BUN 18; Creatinine, Ser 0.77; Hemoglobin 13.5; Magnesium 2.1; Platelets 276; Potassium 5.0; Sodium 140    Lipid Panel Lab Results  Component Value Date   CHOL 130 03/01/2022   HDL 57 03/01/2022   LDLCALC 52 03/01/2022   TRIG 116 03/01/2022      Wt Readings from Last 3 Encounters:  04/07/22 213 lb 8 oz (96.8 kg)  03/02/22 217 lb 2 oz (98.5 kg)  02/25/22 221 lb 9.6 oz (100.5 kg)     ASSESSMENT AND PLAN:  Persistent atrial fibrillation Tolerating diltiazem ER 120 twice daily, bisoprolol HCT 80 Tolerating Eliquis for the past month She is reluctant to proceed with cardioversion first and would prefer medication management in an effort to pharmacologically convert Will start flecainide 100 twice daily, repeat EKG 2 to 3 weeks, if staying in  atrial fibrillation can, we will arrange cardioversion  Essential (primary) hypertension -  Will restart benazepril 20 mg daily with her diltiazem ER 120 twice daily and bisoprolol HCT.  If blood pressure runs low need to decrease benazepril down to 10.  She currently has the 40 mg pills at home as she was on that dose previously and will cut them in half  History of stroke Prior history of stroke, atrial fibrillation found January 2024 Plan as above  Pure hypercholesterolemia Continue Crestor 40, numbers at goal  PAD/Carotid stenosis Mild disease on the left seen on CT scan images Cholesterol at goal   Total encounter time more than 40 minutes  Greater than 50% was spent in counseling and coordination of care with the patient   No orders of the defined types were placed in this encounter.    Signed, Esmond Plants, M.D., Ph.D. 04/07/2022  [place], [place]

## 2022-04-07 NOTE — Patient Instructions (Addendum)
Medication Instructions:  Please start flecainide 100 mg twice a day  Please start benazepril 20 mg daily  If you need a refill on your cardiac medications before your next appointment, please call your pharmacy.   Lab work: No new labs needed  Testing/Procedures: No new testing needed  Follow-Up: At [facility], you and your health needs are our priority.  As part of our continuing mission to provide you with exceptional heart care, we have created designated Provider Care Teams.  These Care Teams include your primary Cardiologist (physician) and Advanced Practice Providers (APPs -  Physician Assistants and Nurse Practitioners) who all work together to provide you with the care you need, when you need it.  You will need a follow up appointment in 3 weeks  Providers on your designated Care Team:   Murray Hodgkins, NP Christell Faith, PA-C Cadence Kathlen Mody, [place]  COVID-19 Vaccine Information can be found at: [email or web address] For questions related to vaccine distribution or appointments, please email [email or web address] or call [phone].

## 2022-04-19 DIAGNOSIS — Z8669 Personal history of other diseases of the nervous system and sense organs: Secondary | ICD-10-CM | POA: Diagnosis not present

## 2022-04-19 DIAGNOSIS — H401111 Primary open-angle glaucoma, right eye, mild stage: Secondary | ICD-10-CM | POA: Diagnosis not present

## 2022-04-19 DIAGNOSIS — H40003 Preglaucoma, unspecified, bilateral: Secondary | ICD-10-CM | POA: Diagnosis not present

## 2022-04-26 DIAGNOSIS — H40003 Preglaucoma, unspecified, bilateral: Secondary | ICD-10-CM | POA: Diagnosis not present

## 2022-04-26 DIAGNOSIS — H401111 Primary open-angle glaucoma, right eye, mild stage: Secondary | ICD-10-CM | POA: Diagnosis not present

## 2022-04-26 DIAGNOSIS — Z8669 Personal history of other diseases of the nervous system and sense organs: Secondary | ICD-10-CM | POA: Diagnosis not present

## 2022-05-02 ENCOUNTER — Other Ambulatory Visit: Payer: Self-pay | Admitting: Cardiovascular Disease

## 2022-05-03 NOTE — Progress Notes (Unsigned)
Cardiology Office Note  Date:  05/04/2022   ID:  Sharon Small, DOB 10/28/1950, MRN [id number]  PCP:  Eulis Foster, MD   Chief Complaint  Patient presents with   3 week follow up     "Doing well." Medications reviewed by the patient verbally.     HPI:  72 year old female with past medical history of hypertension,  hyperlipidemia, PAD/mild carotid disease on the left She presents for follow-up of her history of CVA, persistent atrial fibrillation  Last seen in clinic by myself February 2024 Presents today with her husband Noted to be in atrial fibrillation on that visit with shortness of breath starting September 2023 Started on diltiazem ER 120, continued on bisoprolol HCT BP elevated, increased up to diltiazem BID  In follow-up today continues to appreciate some shortness of breath with heavy exertion such as hills and stairs Knows that she is still in atrial fibrillation Tolerating bisoprolol, diltiazem, flecainide  Denies significant shortness of breath at rest or with low-grade exertion  EKG personally reviewed by myself on todays visit Atrial fibrillation with rate 65 bpm nonspecific ST abnormality  Other past medical history reviewed Hurt foot sept 2023 SOB since sept 2023 Atrial fibrillation noted on office visit January 2024  Previous diagnosis of stroke, leg weakness on right admitted to [facility] on 08/29/2020 and discharged on 09/01/2020. She was treated for CVA.   Presenting with right leg weakness and difficulty walking and feeling off balance.    CT scan of the head was unremarkable  MRI scan showed patchy left ACA infarct without hemorrhage.   CT angiogram of brain and neck showed no significant large vessel stenosis or occlusion.   2D echo showed ejection fraction of 55 to 60% without cardiac source of embolism.   Telemetry monitoring in the hospital did not show any paroxysmal A. fib.    LDL cholesterol was 85 mg percent and hemoglobin A1c  was 5.9.    She was started on dual antiplatelet therapy aspirin and Plavix  Sx resolved that afternoon maintained on Plavix  Outpatient placement of ZIO monitor Patient had a min HR of 42 bpm, max HR of 184 bpm, and avg HR of 58 bpm.  Predominant underlying rhythm was Sinus Rhythm.    1 run of Ventricular Tachycardia occurred lasting 5 beats with a max rate of 184 bpm (avg 140 bpm).    36 Supraventricular Tachycardia  runs occurred, the run with the fastest interval lasting 4 beats with a max rate of 156 bpm, the longest lasting 11.3 secs with an avg rate of 121 bpm.  Isolated SVEs were rare (<1.0%), SVE Couplets were rare (<1.0%), and SVE Triplets were rare (<1.0%).    Isolated VEs were rare (<1.0%), and no VE Couplets or VE Triplets were present.  No patient triggered events recorded  Echo reviewed  1. Left ventricular ejection fraction, by estimation, is 55 to 60%. The  left ventricle has normal function. The left ventricle has no regional  wall motion abnormalities. There is mild concentric left ventricular  hypertrophy. Left ventricular diastolic  parameters were normal.   2. Right ventricular systolic function is normal. The right ventricular  size is normal.   3. The mitral valve is normal in structure. No evidence of mitral valve  regurgitation. No evidence of mitral stenosis.   4. The aortic valve is normal in structure. There is mild calcification  of the aortic valve. Aortic valve regurgitation is not visualized. No  aortic stenosis is present.  5. The inferior vena cava is normal in size with greater than 50%  respiratory variability, suggesting right atrial pressure of 3 mmHg.   Dad was smoker, died 4  PMH:   has a past medical history of CVA (cerebral vascular accident) ([place]), GERD (gastroesophageal reflux disease), Hypertension, and Snores.  PSH:    Past Surgical History:  Procedure Laterality Date   CATARACT EXTRACTION Bilateral 2003 & 2005   COLONOSCOPY      COLONOSCOPY WITH PROPOFOL N/A 07/04/2015   Procedure: COLONOSCOPY WITH PROPOFOL;  Surgeon: Lucilla Lame, MD;  Location: [facility];  Service: Endoscopy;  Laterality: N/A;  prefers early   VAGINAL HYSTERECTOMY  2005   Bilateral oophorectomy    Current Outpatient Medications  Medication Sig Dispense Refill   apixaban (ELIQUIS) 5 MG TABS tablet TAKE 1 TABLET(5 MG) BY MOUTH TWICE DAILY 60 tablet 5   Azelaic Acid 15 % gel Apply a small amount to skin 1-2 times daily as needed. 30 g 5   benazepril (LOTENSIN) 20 MG tablet Take 1 tablet (20 mg total) by mouth daily. 90 tablet 3   bisoprolol-hydrochlorothiazide (ZIAC) 10-6.25 MG tablet TAKE 1 TABLET BY MOUTH DAILY 90 tablet 3   diltiazem (CARDIZEM CD) 120 MG 24 hr capsule Take 1 capsule (120 mg total) by mouth 2 (two) times daily. 180 capsule 3   flecainide (TAMBOCOR) 100 MG tablet Take 1 tablet (100 mg total) by mouth 2 (two) times daily. 180 tablet 3   furosemide (LASIX) 20 MG tablet Take 1 tablet (20 mg total) by mouth daily as needed. 30 tablet 1   Glucosamine HCl (GLUCOSAMINE PO) Take by mouth daily.     MAGNESIUM PO Take by mouth daily as needed. For muscle spasms     Multiple Vitamin (MULTIVITAMIN ADULT PO) Take 1 tablet by mouth daily.     omeprazole (PRILOSEC) 20 MG capsule Take 20 mg by mouth daily.     rosuvastatin (CRESTOR) 40 MG tablet TAKE 1 TABLET(40 MG) BY MOUTH DAILY 90 tablet 3   triamcinolone cream (KENALOG) 0.5 % APPLY SMALL AMOUNT TOPICALLY TO THE AFFECTED AREA TWICE DAILY (Patient not taking: Reported on 03/02/2022)     No current facility-administered medications for this visit.    Allergies:   Patient has no known allergies.   Social History:  The patient  reports that she has never smoked. She has never used smokeless tobacco. She reports that she does not drink alcohol and does not use drugs.   Family History:   family history includes Breast cancer (age of onset: 22) in her maternal aunt; COPD in her mother;  Glaucoma in her sister; Heart disease in her father; Hypertension in her sister; Osteoporosis in her mother; Peripheral Artery Disease in her mother.    Review of Systems: Review of Systems  Constitutional: Negative.   HENT: Negative.    Respiratory: Negative.    Cardiovascular: Negative.   Gastrointestinal: Negative.   Musculoskeletal: Negative.   Neurological: Negative.   Psychiatric/Behavioral: Negative.    All other systems reviewed and are negative.   PHYSICAL EXAM: VS:  BP (!) 140/84 (BP Location: Left Arm, Patient Position: Sitting, Cuff Size: Normal)   Pulse 65   Ht 5' 5.5" (1.664 m)   Wt 217 lb (98.4 kg)   SpO2 95%   BMI 35.56 kg/m  , BMI Body mass index is 35.56 kg/m. Constitutional:  oriented to person, place, and time. No distress.  HENT:  Head: Grossly normal Eyes:  no discharge.  No scleral icterus.  Neck: No JVD, no carotid bruits  Cardiovascular: Irregularly irregular, no murmurs appreciated Pulmonary/Chest: Clear to auscultation bilaterally, no wheezes or rails Abdominal: Soft.  no distension.  no tenderness.  Musculoskeletal: Normal range of motion Neurological:  normal muscle tone. Coordination normal. No atrophy Skin: Skin warm and dry Psychiatric: normal affect, pleasant   Recent Labs: 03/01/2022: ALT 21; Magnesium 2.1 05/04/2022: BUN 20; Creatinine, Ser 0.83; Hemoglobin 13.3; Platelets 267; Potassium 3.9; Sodium 141    Lipid Panel Lab Results  Component Value Date   CHOL 130 03/01/2022   HDL 57 03/01/2022   LDLCALC 52 03/01/2022   TRIG 116 03/01/2022      Wt Readings from Last 3 Encounters:  05/04/22 217 lb (98.4 kg)  04/07/22 213 lb 8 oz (96.8 kg)  03/02/22 217 lb 2 oz (98.5 kg)     ASSESSMENT AND PLAN:  Persistent atrial fibrillation Tolerating diltiazem ER 120 twice daily, bisoprolol HCT 80 Tolerating Eliquis, flecainide 100 twice daily Remains in atrial fibrillation, still having shortness of breath on heavy exertion We will  arrange cardioversion next week at her request in effort to restore normal sinus rhythm  Essential (primary) hypertension -  Blood pressure is well controlled on today's visit. No changes made to the medications.  History of stroke Prior history of stroke, atrial fibrillation found January 2024 Plan to restore normal sinus rhythm as above  Pure hypercholesterolemia Continue Crestor 40, numbers at goal  PAD/Carotid stenosis Mild disease on the left seen on CT scan images Cholesterol at goal   Total encounter time more than 30 minutes  Greater than 50% was spent in counseling and coordination of care with the patient   Orders Placed This Encounter  Procedures   CBC   Basic Metabolic Panel (BMET)   EKG 12-Lead     Signed, Esmond Plants, M.D., Ph.D. 05/04/2022  [place], [place]

## 2022-05-03 NOTE — H&P (View-Only) (Signed)
Cardiology Office Note  Date:  05/04/2022   ID:  Sharon Small, DOB 10/28/1950, MRN [id number]  PCP:  Eulis Foster, MD   Chief Complaint  Patient presents with   3 week follow up     "Doing well." Medications reviewed by the patient verbally.     HPI:  72 year old female with past medical history of hypertension,  hyperlipidemia, PAD/mild carotid disease on the left She presents for follow-up of her history of CVA, persistent atrial fibrillation  Last seen in clinic by myself February 2024 Presents today with her husband Noted to be in atrial fibrillation on that visit with shortness of breath starting September 2023 Started on diltiazem ER 120, continued on bisoprolol HCT BP elevated, increased up to diltiazem BID  In follow-up today continues to appreciate some shortness of breath with heavy exertion such as hills and stairs Knows that she is still in atrial fibrillation Tolerating bisoprolol, diltiazem, flecainide  Denies significant shortness of breath at rest or with low-grade exertion  EKG personally reviewed by myself on todays visit Atrial fibrillation with rate 65 bpm nonspecific ST abnormality  Other past medical history reviewed Hurt foot sept 2023 SOB since sept 2023 Atrial fibrillation noted on office visit January 2024  Previous diagnosis of stroke, leg weakness on right admitted to [facility] on 08/29/2020 and discharged on 09/01/2020. She was treated for CVA.   Presenting with right leg weakness and difficulty walking and feeling off balance.    CT scan of the head was unremarkable  MRI scan showed patchy left ACA infarct without hemorrhage.   CT angiogram of brain and neck showed no significant large vessel stenosis or occlusion.   2D echo showed ejection fraction of 55 to 60% without cardiac source of embolism.   Telemetry monitoring in the hospital did not show any paroxysmal A. fib.    LDL cholesterol was 85 mg percent and hemoglobin A1c  was 5.9.    She was started on dual antiplatelet therapy aspirin and Plavix  Sx resolved that afternoon maintained on Plavix  Outpatient placement of ZIO monitor Patient had a min HR of 42 bpm, max HR of 184 bpm, and avg HR of 58 bpm.  Predominant underlying rhythm was Sinus Rhythm.    1 run of Ventricular Tachycardia occurred lasting 5 beats with a max rate of 184 bpm (avg 140 bpm).    36 Supraventricular Tachycardia  runs occurred, the run with the fastest interval lasting 4 beats with a max rate of 156 bpm, the longest lasting 11.3 secs with an avg rate of 121 bpm.  Isolated SVEs were rare (<1.0%), SVE Couplets were rare (<1.0%), and SVE Triplets were rare (<1.0%).    Isolated VEs were rare (<1.0%), and no VE Couplets or VE Triplets were present.  No patient triggered events recorded  Echo reviewed  1. Left ventricular ejection fraction, by estimation, is 55 to 60%. The  left ventricle has normal function. The left ventricle has no regional  wall motion abnormalities. There is mild concentric left ventricular  hypertrophy. Left ventricular diastolic  parameters were normal.   2. Right ventricular systolic function is normal. The right ventricular  size is normal.   3. The mitral valve is normal in structure. No evidence of mitral valve  regurgitation. No evidence of mitral stenosis.   4. The aortic valve is normal in structure. There is mild calcification  of the aortic valve. Aortic valve regurgitation is not visualized. No  aortic stenosis is present.  5. The inferior vena cava is normal in size with greater than 50%  respiratory variability, suggesting right atrial pressure of 3 mmHg.   Dad was smoker, died 4  PMH:   has a past medical history of CVA (cerebral vascular accident) ([place]), GERD (gastroesophageal reflux disease), Hypertension, and Snores.  PSH:    Past Surgical History:  Procedure Laterality Date   CATARACT EXTRACTION Bilateral 2003 & 2005   COLONOSCOPY      COLONOSCOPY WITH PROPOFOL N/A 07/04/2015   Procedure: COLONOSCOPY WITH PROPOFOL;  Surgeon: Lucilla Lame, MD;  Location: [place];  Service: Endoscopy;  Laterality: N/A;  prefers early   VAGINAL HYSTERECTOMY  2005   Bilateral oophorectomy    Current Outpatient Medications  Medication Sig Dispense Refill   apixaban (ELIQUIS) 5 MG TABS tablet TAKE 1 TABLET(5 MG) BY MOUTH TWICE DAILY 60 tablet 5   Azelaic Acid 15 % gel Apply a small amount to skin 1-2 times daily as needed. 30 g 5   benazepril (LOTENSIN) 20 MG tablet Take 1 tablet (20 mg total) by mouth daily. 90 tablet 3   bisoprolol-hydrochlorothiazide (ZIAC) 10-6.25 MG tablet TAKE 1 TABLET BY MOUTH DAILY 90 tablet 3   diltiazem (CARDIZEM CD) 120 MG 24 hr capsule Take 1 capsule (120 mg total) by mouth 2 (two) times daily. 180 capsule 3   flecainide (TAMBOCOR) 100 MG tablet Take 1 tablet (100 mg total) by mouth 2 (two) times daily. 180 tablet 3   furosemide (LASIX) 20 MG tablet Take 1 tablet (20 mg total) by mouth daily as needed. 30 tablet 1   Glucosamine HCl (GLUCOSAMINE PO) Take by mouth daily.     MAGNESIUM PO Take by mouth daily as needed. For muscle spasms     Multiple Vitamin (MULTIVITAMIN ADULT PO) Take 1 tablet by mouth daily.     omeprazole (PRILOSEC) 20 MG capsule Take 20 mg by mouth daily.     rosuvastatin (CRESTOR) 40 MG tablet TAKE 1 TABLET(40 MG) BY MOUTH DAILY 90 tablet 3   triamcinolone cream (KENALOG) 0.5 % APPLY SMALL AMOUNT TOPICALLY TO THE AFFECTED AREA TWICE DAILY (Patient not taking: Reported on 03/02/2022)     No current facility-administered medications for this visit.    Allergies:   Patient has no known allergies.   Social History:  The patient  reports that she has never smoked. She has never used smokeless tobacco. She reports that she does not drink alcohol and does not use drugs.   Family History:   family history includes Breast cancer (age of onset: 22) in her maternal aunt; COPD in her mother;  Glaucoma in her sister; Heart disease in her father; Hypertension in her sister; Osteoporosis in her mother; Peripheral Artery Disease in her mother.    Review of Systems: Review of Systems  Constitutional: Negative.   HENT: Negative.    Respiratory: Negative.    Cardiovascular: Negative.   Gastrointestinal: Negative.   Musculoskeletal: Negative.   Neurological: Negative.   Psychiatric/Behavioral: Negative.    All other systems reviewed and are negative.   PHYSICAL EXAM: VS:  BP (!) 140/84 (BP Location: Left Arm, Patient Position: Sitting, Cuff Size: Normal)   Pulse 65   Ht 5' 5.5" (1.664 m)   Wt 217 lb (98.4 kg)   SpO2 95%   BMI 35.56 kg/m  , BMI Body mass index is 35.56 kg/m. Constitutional:  oriented to person, place, and time. No distress.  HENT:  Head: Grossly normal Eyes:  no discharge.  No scleral icterus.  Neck: No JVD, no carotid bruits  Cardiovascular: Irregularly irregular, no murmurs appreciated Pulmonary/Chest: Clear to auscultation bilaterally, no wheezes or rails Abdominal: Soft.  no distension.  no tenderness.  Musculoskeletal: Normal range of motion Neurological:  normal muscle tone. Coordination normal. No atrophy Skin: Skin warm and dry Psychiatric: normal affect, pleasant   Recent Labs: 03/01/2022: ALT 21; Magnesium 2.1 05/04/2022: BUN 20; Creatinine, Ser 0.83; Hemoglobin 13.3; Platelets 267; Potassium 3.9; Sodium 141    Lipid Panel Lab Results  Component Value Date   CHOL 130 03/01/2022   HDL 57 03/01/2022   LDLCALC 52 03/01/2022   TRIG 116 03/01/2022      Wt Readings from Last 3 Encounters:  05/04/22 217 lb (98.4 kg)  04/07/22 213 lb 8 oz (96.8 kg)  03/02/22 217 lb 2 oz (98.5 kg)     ASSESSMENT AND PLAN:  Persistent atrial fibrillation Tolerating diltiazem ER 120 twice daily, bisoprolol HCT 80 Tolerating Eliquis, flecainide 100 twice daily Remains in atrial fibrillation, still having shortness of breath on heavy exertion We will  arrange cardioversion next week at her request in effort to restore normal sinus rhythm  Essential (primary) hypertension -  Blood pressure is well controlled on today's visit. No changes made to the medications.  History of stroke Prior history of stroke, atrial fibrillation found January 2024 Plan to restore normal sinus rhythm as above  Pure hypercholesterolemia Continue Crestor 40, numbers at goal  PAD/Carotid stenosis Mild disease on the left seen on CT scan images Cholesterol at goal   Total encounter time more than 30 minutes  Greater than 50% was spent in counseling and coordination of care with the patient   Orders Placed This Encounter  Procedures   CBC   Basic Metabolic Panel (BMET)   EKG 12-Lead     Signed, Esmond Plants, M.D., Ph.D. 05/04/2022  [place], [place]

## 2022-05-03 NOTE — Telephone Encounter (Signed)
Refill Request.  

## 2022-05-03 NOTE — Telephone Encounter (Signed)
Prescription refill request for Eliquis received. Indication: AF Last office visit: 04/07/22  Johnny Bridge MD Scr: 0.77 on 03/01/22  Epic Age:  72 Weight: 96.8kg  Based on above findings Eliquis 5mg  twice daily is the appropriate dose.  Refill approved.

## 2022-05-04 ENCOUNTER — Other Ambulatory Visit
Admission: RE | Admit: 2022-05-04 | Discharge: 2022-05-04 | Disposition: A | Discharge status: Home / Self Care | Payer: Medicare Other | Source: Ambulatory Visit | Attending: Cardiovascular Disease | Admitting: Cardiovascular Disease

## 2022-05-04 ENCOUNTER — Encounter: Payer: Self-pay | Admitting: Cardiovascular Disease

## 2022-05-04 ENCOUNTER — Ambulatory Visit: Payer: Medicare Other | Attending: Cardiovascular Disease | Admitting: Cardiovascular Disease

## 2022-05-04 VITALS — BP 140/84 | HR 65 | Ht 65.5 in | Wt 217.0 lb

## 2022-05-04 DIAGNOSIS — I739 Peripheral vascular disease, unspecified: Secondary | ICD-10-CM

## 2022-05-04 DIAGNOSIS — I639 Cerebral infarction, unspecified: Secondary | ICD-10-CM | POA: Diagnosis not present

## 2022-05-04 DIAGNOSIS — E7800 Pure hypercholesterolemia, unspecified: Secondary | ICD-10-CM | POA: Diagnosis not present

## 2022-05-04 DIAGNOSIS — I10 Essential (primary) hypertension: Secondary | ICD-10-CM | POA: Diagnosis not present

## 2022-05-04 DIAGNOSIS — I4819 Other persistent atrial fibrillation: Secondary | ICD-10-CM | POA: Diagnosis not present

## 2022-05-04 LAB — BASIC METABOLIC PANEL
Anion gap: 7 (ref 5–15)
BUN: 20 mg/dL (ref 8–23)
CO2: 28 mmol/L (ref 22–32)
Calcium: 9.2 mg/dL (ref 8.9–10.3)
Chloride: 106 mmol/L (ref 98–111)
Creatinine, Ser: 0.83 mg/dL (ref 0.44–1.00)
GFR, Estimated: >60 mL/min (ref >60)
Glucose, Bld: 92 mg/dL (ref 70–99)
Potassium: 3.9 mmol/L (ref 3.5–5.1)
Sodium: 141 mmol/L (ref 135–145)

## 2022-05-04 LAB — CBC
HCT: 40.9 % (ref 36.0–46.0)
Hemoglobin: 13.3 g/dL (ref 12.0–15.0)
MCH: 29.8 pg (ref 26.0–34.0)
MCHC: 32.5 g/dL (ref 30.0–36.0)
MCV: 91.5 fL (ref 80.0–100.0)
Platelets: 267 10*3/uL (ref 150–400)
RBC: 4.47 MIL/uL (ref 3.87–5.11)
RDW: 13.2 % (ref 11.5–15.5)
WBC: 6.3 10*3/uL (ref 4.0–10.5)
nRBC: 0.3 % — ABNORMAL HIGH (ref 0.0–0.2)

## 2022-05-04 NOTE — Patient Instructions (Addendum)
Medication Instructions:  No changes  If you need a refill on your cardiac medications before your next appointment, please call your pharmacy.   Lab work: Your physician recommends that you get lab work: [place] Entrance at [facility] 1st desk on the right to check in (REGISTRATION)  Lab hours: Monday- Friday (7:30 am- 5:30 pm)   Testing/Procedures: Cardioversion for atrial fibrillation, next Thursday 3/28th Your physician has recommended that you have a Cardioversion (DCCV). Electrical Cardioversion uses a jolt of electricity to your heart either through paddles or wired patches attached to your chest. This is a controlled, usually prescheduled, procedure. Defibrillation is done under light anesthesia in the hospital, and you usually go home the day of the procedure. This is done to get your heart back into a normal rhythm. You are not awake for the procedure. Please see the instruction sheet given to you today.   You are scheduled for a Cardioversion on Friday 05/14/22 with Dr.Gollan Please arrive at the [place] [facility] at 6:30 a.m. on the day of your procedure.  DIET INSTRUCTIONS:  Nothing to eat or drink after midnight except your medications with a sip of water. HOLD - furosemide (LASIX) 20 MG tablet until after procedure         Labs: CBC & BMP  Medications:  YOU MAY TAKE ALL of your remaining medications with a small amount of water.  Must have a responsible person to drive you home.  Bring a current list of your medications and current insurance cards.    If you have any questions after you get home, please call the office at [phone]   Follow-Up: At [facility], you and your health needs are our priority.  As part of our continuing mission to provide you with exceptional heart care, we have created designated Provider Care Teams.  These Care Teams include your primary Cardiologist (physician) and Advanced Practice Providers (APPs -  Physician  Assistants and Nurse Practitioners) who all work together to provide you with the care you need, when you need it.  You will need a follow up appointment in 1 month  Providers on your designated Care Team:   Murray Hodgkins, NP Christell Faith, PA-C Cadence Kathlen Mody, [place]  COVID-19 Vaccine Information can be found at: [email or web address].uk For questions related to vaccine distribution or appointments, please email [email or web address] or call [phone].

## 2022-05-13 ENCOUNTER — Other Ambulatory Visit: Payer: Self-pay | Admitting: Cardiovascular Disease

## 2022-05-13 DIAGNOSIS — I4819 Other persistent atrial fibrillation: Secondary | ICD-10-CM

## 2022-05-14 ENCOUNTER — Encounter: Payer: Self-pay | Admitting: Cardiovascular Disease

## 2022-05-14 ENCOUNTER — Encounter
Admission: RE | Disposition: A | Discharge status: Home / Self Care | Payer: Self-pay | Source: Home / Self Care | Attending: Cardiovascular Disease

## 2022-05-14 ENCOUNTER — Ambulatory Visit: Payer: Medicare Other | Admitting: General Practice

## 2022-05-14 ENCOUNTER — Ambulatory Visit
Admission: RE | Admit: 2022-05-14 | Discharge: 2022-05-14 | Disposition: A | Discharge status: Home / Self Care | Payer: Medicare Other | Attending: Cardiovascular Disease | Admitting: Cardiovascular Disease

## 2022-05-14 ENCOUNTER — Other Ambulatory Visit: Payer: Self-pay

## 2022-05-14 DIAGNOSIS — Z7901 Long term (current) use of anticoagulants: Secondary | ICD-10-CM | POA: Insufficient documentation

## 2022-05-14 DIAGNOSIS — Z8673 Personal history of transient ischemic attack (TIA), and cerebral infarction without residual deficits: Secondary | ICD-10-CM | POA: Diagnosis not present

## 2022-05-14 DIAGNOSIS — I4819 Other persistent atrial fibrillation: Secondary | ICD-10-CM | POA: Diagnosis not present

## 2022-05-14 DIAGNOSIS — I739 Peripheral vascular disease, unspecified: Secondary | ICD-10-CM | POA: Insufficient documentation

## 2022-05-14 DIAGNOSIS — Z79899 Other long term (current) drug therapy: Secondary | ICD-10-CM | POA: Insufficient documentation

## 2022-05-14 DIAGNOSIS — K219 Gastro-esophageal reflux disease without esophagitis: Secondary | ICD-10-CM | POA: Diagnosis not present

## 2022-05-14 DIAGNOSIS — R0602 Shortness of breath: Secondary | ICD-10-CM

## 2022-05-14 DIAGNOSIS — I4891 Unspecified atrial fibrillation: Secondary | ICD-10-CM | POA: Diagnosis not present

## 2022-05-14 DIAGNOSIS — E785 Hyperlipidemia, unspecified: Secondary | ICD-10-CM | POA: Insufficient documentation

## 2022-05-14 DIAGNOSIS — I10 Essential (primary) hypertension: Secondary | ICD-10-CM | POA: Diagnosis not present

## 2022-05-14 DIAGNOSIS — Z7902 Long term (current) use of antithrombotics/antiplatelets: Secondary | ICD-10-CM | POA: Diagnosis not present

## 2022-05-14 HISTORY — PX: CARDIOVERSION: SHX1299

## 2022-05-14 SURGERY — CARDIOVERSION
Anesthesia: General

## 2022-05-14 MED ORDER — PROPOFOL 10 MG/ML IV BOLUS
INTRAVENOUS | Status: DC | PRN
  Administered 2022-05-14: 50 mg via INTRAVENOUS

## 2022-05-14 MED ORDER — SODIUM CHLORIDE 0.9 % IV SOLN: INTRAVENOUS | Status: DC

## 2022-05-14 MED ORDER — PROPOFOL 10 MG/ML IV BOLUS
INTRAVENOUS | Status: AC
  Filled 2022-05-14: qty 20

## 2022-05-14 NOTE — CV Procedure (Signed)
Cardioversion procedure note For atrial fibrillation, persistent.  Procedure Details:  Consent: Risks of procedure as well as the alternatives and risks of each were explained to the (patient/caregiver).  Consent for procedure obtained.  Time Out: Verified patient identification, verified procedure, site/side was marked, verified correct patient position, special equipment/implants available, medications/allergies/relevent history reviewed, required imaging and test results available.  Performed  Patient placed on cardiac monitor, pulse oximetry, supplemental oxygen as necessary.   Sedation given: propofol IV, Dr. Woodson Pacer pads placed anterior and posterior chest.   Cardioverted 1 time(s).   Cardioverted at  150 J. Synchronized biphasic Converted to NSR   Evaluation: Findings: Post procedure EKG shows: NSR Complications: None Patient did tolerate procedure well.  Time Spent Directly with the Patient:  45 minutes   Tim Shareen Capwell, M.D., Ph.D.  

## 2022-05-14 NOTE — Transfer of Care (Signed)
Immediate Anesthesia Transfer of Care Note  Patient: Sharon Small  Procedure(s) Performed: CARDIOVERSION  Patient Location: Nursing Unit  Anesthesia Type:General  Level of Consciousness: awake, alert , and oriented  Airway & Oxygen Therapy: Patient Spontanous Breathing and Patient connected to nasal cannula oxygen  Post-op Assessment: Report given to RN and Post -op Vital signs reviewed and stable  Post vital signs: Reviewed and stable  Last Vitals:  Vitals Value Taken Time  BP 136/93 05/14/22 0739  Temp    Pulse 50 05/14/22 0741  Resp 17 05/14/22 0741  SpO2 93 % 05/14/22 0741    Last Pain:  Vitals:   05/14/22 0656  TempSrc: Oral  PainSc: 0-No pain         Complications: No notable events documented.

## 2022-05-14 NOTE — Anesthesia Preprocedure Evaluation (Signed)
Anesthesia Evaluation  Patient identified by MRN, date of birth, ID band Patient awake    Reviewed: Allergy & Precautions, NPO status , Patient's Chart, lab work & pertinent test results  History of Anesthesia Complications Negative for: history of anesthetic complications  Airway Mallampati: II  TM Distance: >3 FB Neck ROM: full    Dental  (+) Dental Advidsory Given, Teeth Intact   Pulmonary neg pulmonary ROS, neg shortness of breath, neg COPD   Pulmonary exam normal breath sounds clear to auscultation       Cardiovascular hypertension, negative cardio ROS Normal cardiovascular exam+ dysrhythmias Atrial Fibrillation  Rhythm:irregular Rate:Abnormal     Neuro/Psych CVA, No Residual Symptoms negative neurological ROS  negative psych ROS   GI/Hepatic negative GI ROS, Neg liver ROS,GERD  ,,  Endo/Other  negative endocrine ROS    Renal/GU negative Renal ROS  negative genitourinary   Musculoskeletal   Abdominal   Peds  Hematology negative hematology ROS (+)   Anesthesia Other Findings Past Medical History: No date: CVA (cerebral vascular accident) ([place]) No date: GERD (gastroesophageal reflux disease) No date: Hypertension No date: Snores  Past Surgical History: 2003 & 2005: CATARACT EXTRACTION; Bilateral No date: COLONOSCOPY 07/04/2015: COLONOSCOPY WITH PROPOFOL; N/A     Comment:  Procedure: COLONOSCOPY WITH PROPOFOL;  Surgeon: Lucilla Lame, MD;  Location: [place];  Service:               Endoscopy;  Laterality: N/A;  prefers early 2005: VAGINAL HYSTERECTOMY     Comment:  Bilateral oophorectomy  BMI    Body Mass Index: 36.11 kg/m      Reproductive/Obstetrics negative OB ROS                             Anesthesia Physical Anesthesia Plan  ASA: 2  Anesthesia Plan: General   Post-op Pain Management: Minimal or no pain anticipated   Induction:  Intravenous  PONV Risk Score and Plan: 3 and Propofol infusion, TIVA and Ondansetron  Airway Management Planned: Nasal Cannula  Additional Equipment: None  Intra-op Plan:   Post-operative Plan:   Informed Consent: I have reviewed the patients History and Physical, chart, labs and discussed the procedure including the risks, benefits and alternatives for the proposed anesthesia with the patient or authorized representative who has indicated his/her understanding and acceptance.     Dental advisory given  Plan Discussed with: CRNA and Surgeon  Anesthesia Plan Comments: (Discussed risks of anesthesia with patient, including possibility of difficulty with spontaneous ventilation under anesthesia necessitating airway intervention, PONV, and rare risks such as cardiac or respiratory or neurological events, and allergic reactions. Discussed the role of CRNA in patient's perioperative care. Patient understands.)        Anesthesia Quick Evaluation

## 2022-05-14 NOTE — H&P (Signed)
H&P Addendum, pre-cardioversion ° °Patient was seen and evaluated prior to -cardioversion procedure °Symptoms, prior testing details again confirmed with the patient °Patient examined, no significant change from prior exam °Lab work reviewed in detail personally by myself °Patient understands risk and benefit of the procedure,  °The risks (stroke, cardiac arrhythmias rarely resulting in the need for a temporary or permanent pacemaker, skin irritation or burns and complications associated with conscious sedation including aspiration, arrhythmia, respiratory failure and death), benefits (restoration of normal sinus rhythm) and alternatives of a direct current cardioversion were explained in detail °Patient willing to proceed. ° °Signed, °Tim Reiana Poteet, MD, Ph.D °[facility]  °

## 2022-05-14 NOTE — Anesthesia Postprocedure Evaluation (Signed)
Anesthesia Post Note  Patient: Sharon Small  Procedure(s) Performed: CARDIOVERSION  Patient location during evaluation: Spec[facility] Recovery Anesthesia Type: General Level of consciousness: awake and alert Pain management: pain level controlled Vital Signs Assessment: post-procedure vital signs reviewed and stable Respiratory status: spontaneous breathing, nonlabored ventilation, respiratory function stable and patient connected to nasal cannula oxygen Cardiovascular status: blood pressure returned to baseline and stable Postop Assessment: no apparent nausea or vomiting Anesthetic complications: no  No notable events documented.   Last Vitals:  Vitals:   05/14/22 0741 05/14/22 0745  BP:  125/77  Pulse: (!) 50 (!) 51  Resp: 17 18  Temp:    SpO2: 93% 97%    Last Pain:  Vitals:   05/14/22 0745  TempSrc:   PainSc: 0-No pain                 Dimas Millin

## 2022-05-14 NOTE — Interval H&P Note (Signed)
History and Physical Interval Note:  05/14/2022 6:11 PM  Sharon Small  has presented today for surgery, with the diagnosis of Cardioversion   Afib.  The various methods of treatment have been discussed with the patient and family. After consideration of risks, benefits and other options for treatment, the patient has consented to  Procedure(s): CARDIOVERSION (N/A) as a surgical intervention.  The patient's history has been reviewed, patient examined, no change in status, stable for surgery.  I have reviewed the patient's chart and labs.  Questions were answered to the patient's satisfaction.     Ida Rogue

## 2022-05-25 ENCOUNTER — Ambulatory Visit: Payer: Medicare Other | Admitting: Family Medicine

## 2022-05-28 NOTE — Progress Notes (Unsigned)
I,Sharon Small,acting as a scribe for [employer], MD.,have documented all relevant documentation on the behalf of Sharon Ramp, MD,as directed by  Sharon Ramp, MD while in the presence of Sharon Ramp, MD.   Established patient visit   Patient: Sharon Small   DOB: 04-May-1950   72 y.o. Female  MRN: [id number] Visit Date: 05/31/2022  Today's healthcare provider: Ronnald Ramp, MD   Chief Complaint  Patient presents with   Follow-up   Subjective    HPI  Prediabetes, Follow-up  Lab Results  Component Value Date   HGBA1C 5.9 (H) 03/01/2022   HGBA1C 5.7 (H) 09/10/2021   HGBA1C 5.8 (H) 02/23/2021   GLUCOSE 92 05/04/2022   GLUCOSE 91 03/01/2022   GLUCOSE 106 (H) 02/23/2021    Last seen for for this 3 months ago.  Management since that visit includes managing diet and increasing exercise.  Pertinent Labs:    Component Value Date/Time   CHOL 130 03/01/2022 0848   TRIG 116 03/01/2022 0848   CHOLHDL 2.3 03/01/2022 0848   CHOLHDL 3.5 08/30/2020 0551   CREATININE 0.83 05/04/2022 1227    Wt Readings from Last 3 Encounters:  05/31/22 217 lb 4.8 oz (98.6 kg)  05/14/22 217 lb (98.4 kg)  05/04/22 217 lb (98.4 kg)   Patient here for weight check. Patient reports that since last office visit she was seen by Cardio and was diagnosed with A-Fib Reports that she is feeling better with her breathing  Denies chest pain  States she has not adjusted her diet much since her last visit and has not been as active as she would like   Medications: Outpatient Medications Prior to Visit  Medication Sig   apixaban (ELIQUIS) 5 MG TABS tablet TAKE 1 TABLET(5 MG) BY MOUTH TWICE DAILY   Azelaic Acid 15 % gel Apply a small amount to skin 1-2 times daily as needed.   benazepril (LOTENSIN) 20 MG tablet Take 1 tablet (20 mg total) by mouth daily.   bisoprolol-hydrochlorothiazide (ZIAC) 10-6.25 MG tablet TAKE 1 TABLET BY MOUTH  DAILY   diltiazem (CARDIZEM CD) 120 MG 24 hr capsule Take 1 capsule (120 mg total) by mouth 2 (two) times daily.   flecainide (TAMBOCOR) 100 MG tablet Take 1 tablet (100 mg total) by mouth 2 (two) times daily.   furosemide (LASIX) 20 MG tablet Take 1 tablet (20 mg total) by mouth daily as needed.   Glucosamine HCl (GLUCOSAMINE PO) Take by mouth daily.   MAGNESIUM PO Take by mouth daily as needed. For muscle spasms   Multiple Vitamin (MULTIVITAMIN ADULT PO) Take 1 tablet by mouth daily.   omeprazole (PRILOSEC) 20 MG capsule Take 20 mg by mouth daily.   rosuvastatin (CRESTOR) 40 MG tablet TAKE 1 TABLET(40 MG) BY MOUTH DAILY   triamcinolone cream (KENALOG) 0.5 %    No facility-administered medications prior to visit.    Review of Systems     Objective    BP 128/78 (BP Location: Left Arm, Patient Position: Sitting, Cuff Size: Large)   Pulse (!) 51   Temp 98.3 F (36.8 C) (Oral)   Resp 16   Wt 217 lb 4.8 oz (98.6 kg)   BMI 36.16 kg/m    Physical Exam Vitals reviewed.  Constitutional:      General: She is not in acute distress.    Appearance: Normal appearance. She is not ill-appearing, toxic-appearing or diaphoretic.  Eyes:     Conjunctiva/sclera: Conjunctivae normal.  Cardiovascular:  Rate and Rhythm: Normal rate and regular rhythm.     Pulses: Normal pulses.     Heart sounds: Normal heart sounds. No murmur heard.    No friction rub. No gallop.  Pulmonary:     Effort: Pulmonary effort is normal. No respiratory distress.     Breath sounds: Normal breath sounds. No stridor. No wheezing, rhonchi or rales.  Abdominal:     General: Bowel sounds are normal. There is no distension.     Palpations: Abdomen is soft.     Tenderness: There is no abdominal tenderness.  Musculoskeletal:     Right lower leg: No edema.     Left lower leg: No edema.  Skin:    Findings: No erythema or rash.  Neurological:     Mental Status: She is alert and oriented to person, place, and time.        No results found for any visits on 05/31/22.  Assessment & Plan     Problem List Items Addressed This Visit       Other   Borderline diabetes - Primary    Counseled patient dietary adjustments, DASH diet to help with weight management  She has been steady at 217lbs  Recommended that she incorporate 10--20 mins walks most days of the week to help with weight loss  We will recheck A1c at follow up in 3 months Patient does not wish to be on weight loss medications at this time       Adiposity    Return in about 3 months (around 08/30/2022) for f/u preDM .       The entirety of the information documented in the History of Present Illness, Review of Systems and Physical Exam were personally obtained by me. Portions of this information were initially documented by Hetty Ely, CMA . I, Sharon Ramp, MD have reviewed the documentation above for thoroughness and accuracy.     Sharon Ramp, MD  [facility] [phone] (phone) [phone] (fax)  [facility]

## 2022-05-31 ENCOUNTER — Ambulatory Visit (INDEPENDENT_AMBULATORY_CARE_PROVIDER_SITE_OTHER): Payer: Medicare Other | Admitting: Family Medicine

## 2022-05-31 VITALS — BP 128/78 | HR 51 | Temp 98.3°F | Resp 16 | Wt 217.3 lb

## 2022-05-31 DIAGNOSIS — R7303 Prediabetes: Secondary | ICD-10-CM

## 2022-05-31 DIAGNOSIS — Z6837 Body mass index (BMI) 37.0-37.9, adult: Secondary | ICD-10-CM | POA: Diagnosis not present

## 2022-05-31 NOTE — Assessment & Plan Note (Signed)
Counseled patient dietary adjustments, DASH diet to help with weight management  She has been steady at 217lbs  Recommended that she incorporate 10--20 mins walks most days of the week to help with weight loss  We will recheck A1c at follow up in 3 months Patient does not wish to be on weight loss medications at this time

## 2022-05-31 NOTE — Patient Instructions (Signed)

## 2022-06-14 NOTE — Progress Notes (Unsigned)
Cardiology Office Note  Date:  06/15/2022   ID:  Sharon Small, DOB December 10, 1950, MRN [id number]  PCP:  Ronnald Ramp, MD   Chief Complaint  Patient presents with   1 month follow up     S/p Cardioversion. Medications reviewed by the patient verbally.     HPI:  72 year old female with past medical history of hypertension,  hyperlipidemia, PAD/mild carotid disease on the left Acute stroke July 2022 Persistent atrial fibrillation, cardioversion March 2024 She presents for follow-up of her history of CVA, persistent atrial fibrillation  Last seen in clinic by myself March 2024 Shortness of breath starting September 2023, atrial fibrillation Started on diltiazem ER 120, continued on bisoprolol, diltiazem and follow-up up to twice daily dosing Started on flecainide Underwent cardioversion May 14, 2022, normal sinus rhythm restored  Low heart rates at home, in the 40s Overall feels better normal sinus rhythm, little bit fatigued, wonders if it is from her low heart rate Rushing around today around town doing errands, heart rate 55 on EKG  Denies significant leg swelling, no PND orthopnea  EKG personally reviewed by myself on todays visit Normal sinus rhythm rate 55 bpm, no significant ST-T wave changes  Other past medical history reviewed Hurt foot sept 2023 SOB since sept 2023 Atrial fibrillation noted on office visit January 2024  Previous diagnosis of stroke, leg weakness on right admitted to [facility] on 08/29/2020 and discharged on 09/01/2020. She was treated for CVA.   Presenting with right leg weakness and difficulty walking and feeling off balance.    CT scan of the head was unremarkable  MRI scan showed patchy left ACA infarct without hemorrhage.   CT angiogram of brain and neck showed no significant large vessel stenosis or occlusion.   2D echo showed ejection fraction of 55 to 60% without cardiac source of embolism.   Telemetry monitoring in the hospital  did not show any paroxysmal A. fib.    LDL cholesterol was 85 mg percent and hemoglobin A1c was 5.9.    She was started on dual antiplatelet therapy aspirin and Plavix  Sx resolved that afternoon maintained on Plavix  Outpatient placement of ZIO monitor Patient had a min HR of 42 bpm, max HR of 184 bpm, and avg HR of 58 bpm.  Predominant underlying rhythm was Sinus Rhythm.    1 run of Ventricular Tachycardia occurred lasting 5 beats with a max rate of 184 bpm (avg 140 bpm).    36 Supraventricular Tachycardia  runs occurred, the run with the fastest interval lasting 4 beats with a max rate of 156 bpm, the longest lasting 11.3 secs with an avg rate of 121 bpm.  Isolated SVEs were rare (<1.0%), SVE Couplets were rare (<1.0%), and SVE Triplets were rare (<1.0%).    Isolated VEs were rare (<1.0%), and no VE Couplets or VE Triplets were present.  No patient triggered events recorded  Echo reviewed  1. Left ventricular ejection fraction, by estimation, is 55 to 60%. The  left ventricle has normal function. The left ventricle has no regional  wall motion abnormalities. There is mild concentric left ventricular  hypertrophy. Left ventricular diastolic  parameters were normal.   2. Right ventricular systolic function is normal. The right ventricular  size is normal.   3. The mitral valve is normal in structure. No evidence of mitral valve  regurgitation. No evidence of mitral stenosis.   4. The aortic valve is normal in structure. There is mild calcification  of the  aortic valve. Aortic valve regurgitation is not visualized. No  aortic stenosis is present.   5. The inferior vena cava is normal in size with greater than 50%  respiratory variability, suggesting right atrial pressure of 3 mmHg.   Dad was smoker, died 87  PMH:   has a past medical history of CVA (cerebral vascular accident) (HCC), GERD (gastroesophageal reflux disease), Hypertension, and Snores.  PSH:    Past Surgical  History:  Procedure Laterality Date   CARDIOVERSION N/A 05/14/2022   Procedure: CARDIOVERSION;  Surgeon: Antonieta Iba, MD;  Location: [facility] ORS;  Service: Cardiovascular;  Laterality: N/A;   CATARACT EXTRACTION Bilateral 2003 & 2005   COLONOSCOPY     COLONOSCOPY WITH PROPOFOL N/A 07/04/2015   Procedure: COLONOSCOPY WITH PROPOFOL;  Surgeon: Midge Minium, MD;  Location: [facility];  Service: Endoscopy;  Laterality: N/A;  prefers early   VAGINAL HYSTERECTOMY  2005   Bilateral oophorectomy    Current Outpatient Medications  Medication Sig Dispense Refill   apixaban (ELIQUIS) 5 MG TABS tablet TAKE 1 TABLET(5 MG) BY MOUTH TWICE DAILY 60 tablet 5   Azelaic Acid 15 % gel Apply a small amount to skin 1-2 times daily as needed. 30 g 5   benazepril (LOTENSIN) 20 MG tablet Take 1 tablet (20 mg total) by mouth daily. 90 tablet 3   bisoprolol-hydrochlorothiazide (ZIAC) 10-6.25 MG tablet TAKE 1 TABLET BY MOUTH DAILY 90 tablet 3   diltiazem (CARDIZEM CD) 120 MG 24 hr capsule Take 1 capsule (120 mg total) by mouth 2 (two) times daily. 180 capsule 3   flecainide (TAMBOCOR) 100 MG tablet Take 1 tablet (100 mg total) by mouth 2 (two) times daily. 180 tablet 3   furosemide (LASIX) 20 MG tablet Take 1 tablet (20 mg total) by mouth daily as needed. 30 tablet 1   Glucosamine HCl (GLUCOSAMINE PO) Take by mouth daily.     MAGNESIUM PO Take by mouth daily as needed. For muscle spasms     Multiple Vitamin (MULTIVITAMIN ADULT PO) Take 1 tablet by mouth daily.     omeprazole (PRILOSEC) 20 MG capsule Take 20 mg by mouth daily.     rosuvastatin (CRESTOR) 40 MG tablet TAKE 1 TABLET(40 MG) BY MOUTH DAILY 90 tablet 3   triamcinolone cream (KENALOG) 0.5 %      No current facility-administered medications for this visit.    Allergies:   Patient has no known allergies.   Social History:  The patient  reports that she has never smoked. She has never used smokeless tobacco. She reports that she does not drink  alcohol and does not use drugs.   Family History:   family history includes Breast cancer (age of onset: 68) in her maternal aunt; COPD in her mother; Glaucoma in her sister; Heart disease in her father; Hypertension in her sister; Osteoporosis in her mother; Peripheral Artery Disease in her mother.    Review of Systems: Review of Systems  Constitutional: Negative.   HENT: Negative.    Respiratory: Negative.    Cardiovascular: Negative.   Gastrointestinal: Negative.   Musculoskeletal: Negative.   Neurological: Negative.   Psychiatric/Behavioral: Negative.    All other systems reviewed and are negative.   PHYSICAL EXAM: VS:  BP 120/80 (BP Location: Left Arm, Patient Position: Sitting, Cuff Size: Large)   Pulse (!) 55   Ht 5' 5.5" (1.664 m)   Wt 217 lb (98.4 kg)   SpO2 97%   BMI 35.56 kg/m  , BMI  Body mass index is 35.56 kg/m. Constitutional:  oriented to person, place, and time. No distress.  HENT:  Head: Grossly normal Eyes:  no discharge. No scleral icterus.  Neck: No JVD, no carotid bruits  Cardiovascular: Regular rate and rhythm, no murmurs appreciated Pulmonary/Chest: Clear to auscultation bilaterally, no wheezes or rails Abdominal: Soft.  no distension.  no tenderness.  Musculoskeletal: Normal range of motion Neurological:  normal muscle tone. Coordination normal. No atrophy Skin: Skin warm and dry Psychiatric: normal affect, pleasant   Recent Labs: 03/01/2022: ALT 21; Magnesium 2.1 05/04/2022: BUN 20; Creatinine, Ser 0.83; Hemoglobin 13.3; Platelets 267; Potassium 3.9; Sodium 141    Lipid Panel Lab Results  Component Value Date   CHOL 130 03/01/2022   HDL 57 03/01/2022   LDLCALC 52 03/01/2022   TRIG 116 03/01/2022      Wt Readings from Last 3 Encounters:  06/15/22 217 lb (98.4 kg)  05/31/22 217 lb 4.8 oz (98.6 kg)  05/14/22 217 lb (98.4 kg)     ASSESSMENT AND PLAN:  Persistent atrial fibrillation Recent cardioversion, maintaining normal sinus  rhythm Tolerating diltiazem ER 120 twice daily, bisoprolol HCT  Tolerating Eliquis, flecainide 100 twice daily Given low heart rate we will cut the bisoprolol HCTZ 10/6.25 mg in half daily Recommend she continue to monitor heart rate at rest If it continues to run in the 40s, may need to stop the bisoprolol HCTZ  Essential (primary) hypertension -  Changed to bisoprolol HCTZ as above for bradycardia  History of stroke Prior history of stroke, atrial fibrillation found January 2024 Stay on Eliquis 5 twice daily  Pure hypercholesterolemia Cholesterol is at goal on the current lipid regimen. No changes to the medications were made.  PAD/Carotid stenosis Mild disease on the left seen on CT scan images Cholesterol at goal   Total encounter time more than 30 minutes  Greater than 50% was spent in counseling and coordination of care with the patient   No orders of the defined types were placed in this encounter.    Signed, Dossie Arbour, M.D., Ph.D. 06/15/2022  [facility] [place], [place] [phone]

## 2022-06-15 ENCOUNTER — Ambulatory Visit: Payer: Medicare Other | Attending: Cardiovascular Disease | Admitting: Cardiovascular Disease

## 2022-06-15 ENCOUNTER — Encounter: Payer: Self-pay | Admitting: Cardiovascular Disease

## 2022-06-15 VITALS — BP 120/80 | HR 55 | Ht 65.5 in | Wt 217.0 lb

## 2022-06-15 DIAGNOSIS — I4819 Other persistent atrial fibrillation: Secondary | ICD-10-CM | POA: Diagnosis not present

## 2022-06-15 DIAGNOSIS — E7800 Pure hypercholesterolemia, unspecified: Secondary | ICD-10-CM | POA: Diagnosis not present

## 2022-06-15 DIAGNOSIS — R7303 Prediabetes: Secondary | ICD-10-CM | POA: Diagnosis not present

## 2022-06-15 DIAGNOSIS — I10 Essential (primary) hypertension: Secondary | ICD-10-CM

## 2022-06-15 DIAGNOSIS — I739 Peripheral vascular disease, unspecified: Secondary | ICD-10-CM | POA: Diagnosis not present

## 2022-06-15 DIAGNOSIS — I639 Cerebral infarction, unspecified: Secondary | ICD-10-CM | POA: Diagnosis not present

## 2022-06-15 MED ORDER — BISOPROLOL-HYDROCHLOROTHIAZIDE 10-6.25 MG PO TABS: 0.5000 | ORAL_TABLET | Freq: Every day | ORAL | 3 refills | Status: DC

## 2022-06-15 NOTE — Patient Instructions (Addendum)
Medication Instructions:  Please cut the bisoprolol-hctz in 1/2 daily   If you need a refill on your cardiac medications before your next appointment, please call your pharmacy.   Lab work: No new labs needed  Testing/Procedures: No new testing needed  Follow-Up: At [facility] ([facility]), you and your health needs are our priority.  As part of our continuing mission to provide you with exceptional heart care, we have created designated Provider Care Teams.  These Care Teams include your primary Cardiologist (physician) and Advanced Practice Providers (APPs -  Physician Assistants and Nurse Practitioners) who all work together to provide you with the care you need, when you need it.  You will need a follow up appointment in 6 months  Providers on your designated Care Team:   Nicolasa Ducking, NP Eula Listen, PA-C Cadence Fransico Michael, [place]  COVID-19 Vaccine Information can be found at: [email or web address] For questions related to vaccine distribution or appointments, please email [email or web address]@ [email or web address] or call [phone].

## 2022-07-19 ENCOUNTER — Telehealth: Payer: Medicare Other | Admitting: Physician Assistant

## 2022-07-19 DIAGNOSIS — J208 Acute bronchitis due to other specified organisms: Secondary | ICD-10-CM | POA: Diagnosis not present

## 2022-07-19 MED ORDER — BENZONATATE 100 MG PO CAPS: 100.0000 mg | ORAL_CAPSULE | Freq: Three times a day (TID) | ORAL | 0 refills | Status: DC | PRN

## 2022-07-19 NOTE — Progress Notes (Signed)
E-Visit for Cough  We are sorry that you are not feeling well.  Here is how we plan to help!  Based on your presentation I believe you most likely have A cough due to a virus.  This is called viral bronchitis and is best treated by rest, plenty of fluids and control of the cough.  You may use Ibuprofen or Tylenol as directed to help your symptoms.     In addition you may use a non prescription cough medication -- Delsym or plain Mucinex. We cannot prescribe controlled medications via e-visit so I cannot send in the Tussionex. I have sent in a prescription cough medication called benzonatate that you can take with the recommended OTC medications.   From your responses in the eVisit questionnaire you describe inflammation in the upper respiratory tract which is causing a significant cough.  This is commonly called Bronchitis and has four common causes:   Allergies Viral Infections Acid Reflux Bacterial Infection Allergies, viruses and acid reflux are treated by controlling symptoms or eliminating the cause. An example might be a cough caused by taking certain blood pressure medications. You stop the cough by changing the medication. Another example might be a cough caused by acid reflux. Controlling the reflux helps control the cough.  USE OF BRONCHODILATOR ("RESCUE") INHALERS: There is a risk from using your bronchodilator too frequently.  The risk is that over-reliance on a medication which only relaxes the muscles surrounding the breathing tubes can reduce the effectiveness of medications prescribed to reduce swelling and congestion of the tubes themselves.  Although you feel brief relief from the bronchodilator inhaler, your asthma may actually be worsening with the tubes becoming more swollen and filled with mucus.  This can delay other crucial treatments, such as oral steroid medications. If you need to use a bronchodilator inhaler daily, several times per day, you should discuss this with your  provider.  There are probably better treatments that could be used to keep your asthma under control.     HOME CARE Only take medications as instructed by your medical team. Complete the entire course of an antibiotic. Drink plenty of fluids and get plenty of rest. Avoid close contacts especially the very young and the elderly Cover your mouth if you cough or cough into your sleeve. Always remember to wash your hands A steam or ultrasonic humidifier can help congestion.   GET HELP RIGHT AWAY IF: You develop worsening fever. You become short of breath You cough up blood. Your symptoms persist after you have completed your treatment plan MAKE SURE YOU  Understand these instructions. Will watch your condition. Will get help right away if you are not doing well or get worse.    Thank you for choosing an e-visit.  Your e-visit answers were reviewed by a board certified advanced clinical practitioner to complete your personal care plan. Depending upon the condition, your plan could have included both over the counter or prescription medications.  Please review your pharmacy choice. Make sure the pharmacy is open so you can pick up prescription now. If there is a problem, you may contact your provider through [employer] and have the prescription routed to another pharmacy.  Your safety is important to [place]. If you have drug allergies check your prescription carefully.   For the next 24 hours you can use MyChart to ask questions about today's visit, request a non-urgent call back, or ask for a work or school excuse. You will get an email in  the next two days asking about your experience. I hope that your e-visit has been valuable and will speed your recovery.

## 2022-07-19 NOTE — Progress Notes (Signed)
I have spent 5 minutes in review of e-visit questionnaire, review and updating patient chart, medical decision making and response to patient.   Cassandra Mcmanaman Cody Aisling Emigh, PA-C    

## 2022-07-20 ENCOUNTER — Ambulatory Visit (INDEPENDENT_AMBULATORY_CARE_PROVIDER_SITE_OTHER): Payer: Medicare Other | Admitting: Physician Assistant

## 2022-07-20 ENCOUNTER — Telehealth: Payer: Self-pay | Admitting: Family Medicine

## 2022-07-20 ENCOUNTER — Encounter: Payer: Self-pay | Admitting: Physician Assistant

## 2022-07-20 VITALS — BP 127/62 | HR 58 | Temp 98.2°F | Resp 13 | Ht 65.0 in | Wt 214.0 lb

## 2022-07-20 DIAGNOSIS — J209 Acute bronchitis, unspecified: Secondary | ICD-10-CM

## 2022-07-20 DIAGNOSIS — R051 Acute cough: Secondary | ICD-10-CM | POA: Diagnosis not present

## 2022-07-20 MED ORDER — HYDROCOD POLI-CHLORPHE POLI ER 10-8 MG/5ML PO SUER: 5.0000 mL | Freq: Two times a day (BID) | ORAL | 0 refills | Status: DC

## 2022-07-20 MED ORDER — ALBUTEROL SULFATE HFA 108 (90 BASE) MCG/ACT IN AERS: 2.0000 | INHALATION_SPRAY | Freq: Four times a day (QID) | RESPIRATORY_TRACT | 2 refills | Status: AC | PRN

## 2022-07-20 NOTE — Telephone Encounter (Signed)
[facility] requesting prior authorization Key: [id number] Name: Ober Hydrocod Pli Chlophe Poli ER 10-8MG /5ML ER Suspension has been rejected and requires PA

## 2022-07-20 NOTE — Progress Notes (Signed)
I,Sharon  Small,acting as a Neurosurgeon for [employer], PA-C.,have documented all relevant documentation on the behalf of Sharon Ferguson, PA-C,as directed by  Sharon Ferguson, PA-C while in the presence of Sharon Ferguson, PA-C.    Established patient visit   Patient: Sharon Small   DOB: [birth year]   72 y.o. Female  MRN: [id number] Visit Date: 07/20/2022  Today's healthcare provider: [name], PA-C   Cc. Cough, congestion x 1 week  Subjective    HPI   Pt reports sneezing, sinus congestion, headaches, cough x 1 week. Reports coughing spasm where she was a little short of breath. Denies SOB at rest. Denies fevers. Taking tesslon prescribed and delsym otc with no improvement.   Medications: Outpatient Medications Prior to Visit  Medication Sig   apixaban (ELIQUIS) 5 MG TABS tablet TAKE 1 TABLET(5 MG) BY MOUTH TWICE DAILY   Azelaic Acid 15 % gel Apply a small amount to skin 1-2 times daily as needed.   benazepril (LOTENSIN) 20 MG tablet Take 1 tablet (20 mg total) by mouth daily.   benzonatate (TESSALON) 100 MG capsule Take 1 capsule (100 mg total) by mouth 3 (three) times daily as needed for cough.   bisoprolol-hydrochlorothiazide (ZIAC) 10-6.25 MG tablet Take 0.5 tablets by mouth daily.   diltiazem (CARDIZEM CD) 120 MG 24 hr capsule Take 1 capsule (120 mg total) by mouth 2 (two) times daily.   flecainide (TAMBOCOR) 100 MG tablet Take 1 tablet (100 mg total) by mouth 2 (two) times daily.   Glucosamine HCl (GLUCOSAMINE PO) Take by mouth daily.   MAGNESIUM PO Take by mouth daily as needed. For muscle spasms   Multiple Vitamin (MULTIVITAMIN ADULT PO) Take 1 tablet by mouth daily.   omeprazole (PRILOSEC) 20 MG capsule Take 20 mg by mouth daily.   rosuvastatin (CRESTOR) 40 MG tablet TAKE 1 TABLET(40 MG) BY MOUTH DAILY   triamcinolone cream (KENALOG) 0.5 %    furosemide (LASIX) 20 MG tablet Take 1 tablet (20 mg total) by mouth daily as needed.   No facility-administered  medications prior to visit.    Review of Systems  Constitutional:  Negative for fatigue and fever.  HENT:  Positive for congestion, rhinorrhea, sinus pressure, sinus pain and sore throat.   Respiratory:  Positive for cough, shortness of breath and wheezing.   Cardiovascular:  Negative for chest pain and leg swelling.  Gastrointestinal:  Negative for abdominal pain.  Neurological:  Negative for dizziness and headaches.       Objective    BP 127/62 (BP Location: Right Arm, Patient Position: Sitting, Cuff Size: Large)   Pulse (!) 58   Temp 98.2 F (36.8 C) (Oral)   Resp 13   Ht 5\' 5"  (1.651 m)   Wt 214 lb (97.1 kg)   SpO2 98%   BMI 35.61 kg/m    Physical Exam Constitutional:      General: She is awake.     Appearance: She is well-developed.  HENT:     Head: Normocephalic.  Eyes:     Conjunctiva/sclera: Conjunctivae normal.  Cardiovascular:     Rate and Rhythm: Normal rate.  Pulmonary:     Effort: Pulmonary effort is normal.     Breath sounds: Normal breath sounds. No wheezing.  Skin:    General: Skin is warm.  Neurological:     Mental Status: She is alert and oriented to person, place, and time.  Psychiatric:        Attention and Perception: Attention  normal.        Mood and Affect: Mood normal.        Speech: Speech normal.        Behavior: Behavior is cooperative.      No results found for any visits on 07/20/22.  Assessment & Plan     1. Acute cough 2. Acute bronchitis, unspecified organism Rec mucinex otc, increase fluids Albuterol PRN   - chlorpheniramine-HYDROcodone (TUSSIONEX) 10-8 MG/5ML; Take 5 mLs by mouth 2 (two) times daily.  Dispense: 115 mL; Refill: 0 - albuterol (VENTOLIN HFA) 108 (90 Base) MCG/ACT inhaler; Inhale 2 puffs into the lungs every 6 (six) hours as needed for wheezing or shortness of breath.  Dispense: 8 g; Refill: 2   Return if symptoms worsen or fail to improve.      I, Sharon Ferguson, PA-C have reviewed all documentation for  this visit. The documentation on  07/20/22   for the exam, diagnosis, procedures, and orders are all accurate and complete.  Sharon Ferguson, PA-C [facility] [address] [place], [place], [address] Office: [phone] Fax: [phone]   [facility]

## 2022-07-21 NOTE — Telephone Encounter (Signed)
This is prescription is for acute cough that she was evaluated for yesterday by Lillia Abed.

## 2022-07-21 NOTE — Telephone Encounter (Signed)
Status: PA RequestCreated: June 4th, 2024 [phone]: June 5th, 2024

## 2022-08-11 ENCOUNTER — Telehealth: Payer: Self-pay | Admitting: Family Medicine

## 2022-08-11 NOTE — Telephone Encounter (Signed)
Provider not in office patient should contact cardiology

## 2022-08-11 NOTE — Addendum Note (Signed)
Addended by: Lily Kocher on: 08/11/2022 08:38 AM   Modules accepted: Orders

## 2022-08-11 NOTE — Telephone Encounter (Addendum)
[facility] pharmacy faxed refill request for the following medications:   benazepril (LOTENSIN) 40 MG tablet     Please advise  

## 2022-08-13 ENCOUNTER — Telehealth: Payer: Self-pay | Admitting: Family Medicine

## 2022-08-13 ENCOUNTER — Other Ambulatory Visit: Payer: Self-pay

## 2022-08-13 DIAGNOSIS — E7800 Pure hypercholesterolemia, unspecified: Secondary | ICD-10-CM

## 2022-08-13 MED ORDER — ROSUVASTATIN CALCIUM 40 MG PO TABS: ORAL_TABLET | ORAL | 3 refills | Status: DC

## 2022-08-13 NOTE — Telephone Encounter (Signed)
[facility] pharmacy requesting prescription refill rosuvastatin (CRESTOR) 40 MG tablet   Please advise

## 2022-08-30 ENCOUNTER — Ambulatory Visit: Payer: Medicare Other | Admitting: Family Medicine

## 2022-10-19 ENCOUNTER — Other Ambulatory Visit: Payer: Self-pay | Admitting: Cardiovascular Disease

## 2022-10-19 NOTE — Telephone Encounter (Signed)
Prescription refill request for Eliquis received. Indication:afib Last office visit:4/24 Scr:0.83  3/24 Age: 72 Weight:97.1  kg  Prescription refilled

## 2022-10-19 NOTE — Telephone Encounter (Signed)
Refill request

## 2022-11-01 DIAGNOSIS — Z961 Presence of intraocular lens: Secondary | ICD-10-CM | POA: Diagnosis not present

## 2022-11-01 DIAGNOSIS — H40003 Preglaucoma, unspecified, bilateral: Secondary | ICD-10-CM | POA: Diagnosis not present

## 2022-12-02 ENCOUNTER — Other Ambulatory Visit: Payer: Self-pay

## 2022-12-02 DIAGNOSIS — L719 Rosacea, unspecified: Secondary | ICD-10-CM

## 2022-12-02 MED ORDER — AZELAIC ACID 15 % EX GEL: CUTANEOUS | 3 refills | Status: DC

## 2022-12-12 NOTE — Progress Notes (Unsigned)
Cardiology Office Note  Date:  12/13/2022   ID:  Sharon Small, DOB [birth year], MRN [id number]  PCP:  Ronnald Ramp, MD   Chief Complaint  Patient presents with   6 month follow up     Patient c/o decrease in energy and heart rate running in the 50's. Medications reviewed by the patient verbally.     HPI:  72 year old female with past medical history of hypertension,  hyperlipidemia, PAD/mild carotid disease on the left Acute stroke July 2022 Persistent atrial fibrillation, cardioversion March 2024 She presents for follow-up of her history of CVA, persistent atrial fibrillation  Last seen in clinic by myself April 2024 Underwent cardioversion May 14, 2022, normal sinus rhythm restored  Follow-up today denies significant atrial fibrillation Labile blood pressure, sometimes measuring before medications, other times after medications Range 107 systolic up to 150 systolic Did not take medications this morning blood pressure 150 systolic Low heartbeat high 40s low 50s Some fatigue, no regular exercise program or walking program  Has 6 acres at home, uses a golf cart Likes to go shopping No lower extremity swelling, no orthopnea PND  Prior A-fib history reviewed Shortness of breath starting September 2023, atrial fibrillation Started on diltiazem ER 120, continued on bisoprolol,  diltiazem in  follow-up up to twice daily dosing Started on flecainide  EKG personally reviewed by myself on todays visit EKG Interpretation Date/Time:  Monday December 13 2022 08:07:19 EDT Ventricular Rate:  51 PR Interval:  210 QRS Duration:  100 QT Interval:  496 QTC Calculation: 457 R Axis:   15  Text Interpretation: Sinus bradycardia When compared with ECG of 14-May-2022 07:40, No significant change was found Confirmed by Julien Nordmann [phone]) on 12/13/2022 8:09:47 AM   Other past medical history reviewed Hurt foot sept 2023 SOB since sept 2023 Atrial fibrillation  noted on office visit January 2024  Previous diagnosis of stroke, leg weakness on right admitted to [facility] and discharged on 09/01/2020. She was treated for CVA.   Presenting with right leg weakness and difficulty walking and feeling off balance.    CT scan of the head was unremarkable  MRI scan showed patchy left ACA infarct without hemorrhage.   CT angiogram of brain and neck showed no significant large vessel stenosis or occlusion.   2D echo showed ejection fraction of 55 to 60% without cardiac source of embolism.   Telemetry monitoring in the hospital did not show any paroxysmal A. fib.    LDL cholesterol was 85 mg percent and hemoglobin A1c was 5.9.    She was started on dual antiplatelet therapy aspirin and Plavix  Sx resolved that afternoon maintained on Plavix  Outpatient placement of ZIO monitor Patient had a min HR of 42 bpm, max HR of 184 bpm, and avg HR of 58 bpm.  Predominant underlying rhythm was Sinus Rhythm.    1 run of Ventricular Tachycardia occurred lasting 5 beats with a max rate of 184 bpm (avg 140 bpm).    36 Supraventricular Tachycardia  runs occurred, the run with the fastest interval lasting 4 beats with a max rate of 156 bpm, the longest lasting 11.3 secs with an avg rate of 121 bpm.  Isolated SVEs were rare (<1.0%), SVE Couplets were rare (<1.0%), and SVE Triplets were rare (<1.0%).    Isolated VEs were rare (<1.0%), and no VE Couplets or VE Triplets were present.  No patient triggered events recorded  Echo reviewed  1. Left ventricular ejection fraction, by estimation,  is 55 to 60%. The  left ventricle has normal function. The left ventricle has no regional  wall motion abnormalities. There is mild concentric left ventricular  hypertrophy. Left ventricular diastolic  parameters were normal.   2. Right ventricular systolic function is normal. The right ventricular  size is normal.   3. The mitral valve is normal in structure. No evidence of  mitral valve  regurgitation. No evidence of mitral stenosis.   4. The aortic valve is normal in structure. There is mild calcification  of the aortic valve. Aortic valve regurgitation is not visualized. No  aortic stenosis is present.   5. The inferior vena cava is normal in size with greater than 50%  respiratory variability, suggesting right atrial pressure of 3 mmHg.   Dad was smoker, died 38  PMH:   has a past medical history of CVA (cerebral vascular accident) (HCC), GERD (gastroesophageal reflux disease), Hypertension, and Snores.  PSH:    Past Surgical History:  Procedure Laterality Date   CARDIOVERSION N/A 05/14/2022   Procedure: CARDIOVERSION;  Surgeon: Antonieta Iba, MD;  Location: [facility];  Service: Cardiovascular;  Laterality: N/A;   CATARACT EXTRACTION Bilateral 2003 & 2005   COLONOSCOPY     COLONOSCOPY WITH PROPOFOL N/A 07/04/2015   Procedure: COLONOSCOPY WITH PROPOFOL;  Surgeon: Midge Minium, MD;  Location[facility];  Service: Endoscopy;  Laterality: N/A;  prefers early   VAGINAL HYSTERECTOMY  2005   Bilateral oophorectomy    Current Outpatient Medications  Medication Sig Dispense Refill   albuterol (VENTOLIN HFA) 108 (90 Base) MCG/ACT inhaler Inhale 2 puffs into the lungs every 6 (six) hours as needed for wheezing or shortness of breath. 8 g 2   Azelaic Acid 15 % gel Apply a small amount to skin 1-2 times daily as needed. 30 g 3   benazepril (LOTENSIN) 20 MG tablet Take 1 tablet (20 mg total) by mouth daily. 90 tablet 3   bisoprolol-hydrochlorothiazide (ZIAC) 10-6.25 MG tablet Take 0.5 tablets by mouth daily. 90 tablet 3   diltiazem (CARDIZEM CD) 120 MG 24 hr capsule Take 1 capsule (120 mg total) by mouth 2 (two) times daily. 180 capsule 3   ELIQUIS 5 MG TABS tablet TAKE 1 TABLET(5 MG) BY MOUTH TWICE DAILY 60 tablet 5   flecainide (TAMBOCOR) 100 MG tablet Take 1 tablet (100 mg total) by mouth 2 (two) times daily. 180 tablet 3   Glucosamine HCl  (GLUCOSAMINE PO) Take by mouth daily.     MAGNESIUM PO Take by mouth daily as needed. For muscle spasms     Multiple Vitamin (MULTIVITAMIN ADULT PO) Take 1 tablet by mouth daily.     omeprazole (PRILOSEC) 20 MG capsule Take 20 mg by mouth daily.     rosuvastatin (CRESTOR) 40 MG tablet TAKE 1 TABLET(40 MG) BY MOUTH DAILY 90 tablet 3   furosemide (LASIX) 20 MG tablet Take 1 tablet (20 mg total) by mouth daily as needed. (Patient not taking: Reported on 12/13/2022) 30 tablet 1   triamcinolone cream (KENALOG) 0.5 %  (Patient not taking: Reported on 12/13/2022)     No current facility-administered medications for this visit.    Allergies:   Patient has no known allergies.   Social History:  The patient  reports that she has never smoked. She has never used smokeless tobacco. She reports that she does not drink alcohol and does not use drugs.   Family History:   family history includes Breast cancer (age of onset:  70) in her maternal aunt; COPD in her mother; Glaucoma in her sister; Heart disease in her father; Hypertension in her sister; Osteoporosis in her mother; Peripheral Artery Disease in her mother.    Review of Systems: Review of Systems  Constitutional: Negative.   HENT: Negative.    Respiratory: Negative.    Cardiovascular: Negative.   Gastrointestinal: Negative.   Musculoskeletal: Negative.   Neurological: Negative.   Psychiatric/Behavioral: Negative.    All other systems reviewed and are negative.   PHYSICAL EXAM: VS:  Ht 5' 5.5" (1.664 m)   Wt 218 lb 6 oz (99.1 kg)   BMI 35.79 kg/m  , BMI Body mass index is 35.79 kg/m. Constitutional:  oriented to person, place, and time. No distress.  HENT:  Head: Grossly normal Eyes:  no discharge. No scleral icterus.  Neck: No JVD, no carotid bruits  Cardiovascular: Regular rate and rhythm, no murmurs appreciated Pulmonary/Chest: Clear to auscultation bilaterally, no wheezes or rails Abdominal: Soft.  no distension.  no  tenderness.  Musculoskeletal: Normal range of motion Neurological:  normal muscle tone. Coordination normal. No atrophy Skin: Skin warm and dry Psychiatric: normal affect, pleasant,  Recent Labs: 03/01/2022: ALT 21; Magnesium 2.1 05/04/2022: BUN 20; Creatinine, Ser 0.83; Hemoglobin 13.3; Platelets 267; Potassium 3.9; Sodium 141    Lipid Panel Lab Results  Component Value Date   CHOL 130 03/01/2022   HDL 57 03/01/2022   LDLCALC 52 03/01/2022   TRIG 116 03/01/2022      Wt Readings from Last 3 Encounters:  12/13/22 218 lb 6 oz (99.1 kg)  07/20/22 214 lb (97.1 kg)  06/15/22 217 lb (98.4 kg)     ASSESSMENT AND PLAN:  Persistent atrial fibrillation Hx of cardioversion, maintaining normal sinus rhythm Tolerating diltiazem ER 120 twice daily, bisoprolol HCT  Discussion today concerning potential effects for bradycardia, possible fatigue Given bradycardia we will stop the bisoprolol HCT 3 Tolerating Eliquis, flecainide 100 twice daily  Essential (primary) hypertension -  Some high blood pressures but has not been measuring systematically Recommend she monitor blood pressure 2 hours after taking morning medications Did not take medications this morning  History of stroke Prior history of stroke, atrial fibrillation found January 2024  on Eliquis 5 twice daily  Pure hypercholesterolemia Cholesterol is at goal on the current lipid regimen. No changes to the medications were made.  PAD/Carotid stenosis Mild disease on the left seen on CT scan images Cholesterol at goal No further testing needed   Orders Placed This Encounter  Procedures   EKG 12-Lead     Signed, Dossie Arbour, M.D., Ph.D. 12/13/2022  [facility] [place], [place] [phone]

## 2022-12-13 ENCOUNTER — Ambulatory Visit: Payer: Medicare Other | Attending: Cardiovascular Disease | Admitting: Cardiovascular Disease

## 2022-12-13 ENCOUNTER — Encounter: Payer: Self-pay | Admitting: Cardiovascular Disease

## 2022-12-13 VITALS — BP 150/80 | HR 51 | Ht 65.5 in | Wt 218.4 lb

## 2022-12-13 DIAGNOSIS — I739 Peripheral vascular disease, unspecified: Secondary | ICD-10-CM

## 2022-12-13 DIAGNOSIS — I10 Essential (primary) hypertension: Secondary | ICD-10-CM

## 2022-12-13 DIAGNOSIS — I4819 Other persistent atrial fibrillation: Secondary | ICD-10-CM

## 2022-12-13 DIAGNOSIS — E7800 Pure hypercholesterolemia, unspecified: Secondary | ICD-10-CM | POA: Diagnosis not present

## 2022-12-13 DIAGNOSIS — I639 Cerebral infarction, unspecified: Secondary | ICD-10-CM | POA: Diagnosis not present

## 2022-12-13 DIAGNOSIS — R7303 Prediabetes: Secondary | ICD-10-CM

## 2022-12-13 NOTE — Patient Instructions (Signed)
Medication Instructions:  Hold the bisoprolol HCTZ  If you need a refill on your cardiac medications before your next appointment, please call your pharmacy.   Lab work: No new labs needed  Testing/Procedures: No new testing needed  Follow-Up: At [facility], you and your health needs are our priority.  As part of our continuing mission to provide you with exceptional heart care, we have created designated Provider Care Teams.  These Care Teams include your primary Cardiologist (physician) and Advanced Practice Providers (APPs -  Physician Assistants and Nurse Practitioners) who all work together to provide you with the care you need, when you need it.  You will need a follow up appointment in 12 months  Providers on your designated Care Team:   Nicolasa Ducking, NP Eula Listen, PA-C Cadence Fransico Michael, [place]  COVID-19 Vaccine Information can be found at: [email or web address] For questions related to vaccine distribution or appointments, please email [email or web address] or call [phone].

## 2022-12-20 ENCOUNTER — Encounter: Payer: Self-pay | Admitting: Cardiovascular Disease

## 2022-12-24 NOTE — Telephone Encounter (Signed)
Called patient and left message for call back.

## 2022-12-27 MED ORDER — VALSARTAN-HYDROCHLOROTHIAZIDE 160-12.5 MG PO TABS: 1.0000 | ORAL_TABLET | Freq: Every day | ORAL | 3 refills | Status: DC

## 2022-12-27 NOTE — Addendum Note (Signed)
Addended by: Jani Gravel on: 12/27/2022 08:19 AM   Modules accepted: Orders

## 2023-01-03 ENCOUNTER — Ambulatory Visit: Payer: Medicare Other | Admitting: Dermatology

## 2023-01-03 DIAGNOSIS — L738 Other specified follicular disorders: Secondary | ICD-10-CM | POA: Diagnosis not present

## 2023-01-03 DIAGNOSIS — L709 Acne, unspecified: Secondary | ICD-10-CM

## 2023-01-03 DIAGNOSIS — L719 Rosacea, unspecified: Secondary | ICD-10-CM

## 2023-01-03 DIAGNOSIS — L700 Acne vulgaris: Secondary | ICD-10-CM

## 2023-01-03 MED ORDER — DOXYCYCLINE MONOHYDRATE 100 MG PO CAPS: ORAL_CAPSULE | ORAL | 2 refills | Status: DC

## 2023-01-03 MED ORDER — METRONIDAZOLE 0.75 % EX CREA: TOPICAL_CREAM | CUTANEOUS | 2 refills | Status: DC

## 2023-01-03 NOTE — Progress Notes (Signed)
   Follow-Up Visit   Subjective  Sharon Small is a 72 y.o. female who presents for the following: Rosacea Having breakouts at cheeks feels hard bumps.  No new medications or skin products. Break out started at end of September.  Feels like she is getting scarring.  The following portions of the chart were reviewed this encounter and updated as appropriate: medications, allergies, medical history  Review of Systems:  No other skin or systemic complaints except as noted in HPI or Assessment and Plan.  Objective  Well appearing patient in no apparent distress; mood and affect are within normal limits.  Areas Examined: face  Relevant physical exam findings are noted in the Assessment and Plan.    Assessment & Plan   Rosacea  Related Medications Azelaic Acid 15 % gel Apply a small amount to skin 1-2 times daily as needed.  doxycycline (MONODOX) 100 MG capsule Take 1 capsule by mouth daily with food and drink  metroNIDAZOLE (METROCREAM) 0.75 % cream Apply topically to affected areas twice daily    ROSACEA with acne   Exam:  scattered inflammatory papules on cheeks, some cystic  Chronic and persistent condition with duration or expected duration over one year. Condition is bothersome/symptomatic for patient. Currently flared.   Rosacea is a chronic progressive skin condition usually affecting the face of adults, causing redness and/or acne bumps. It is treatable but not curable. It sometimes affects the eyes (ocular rosacea) as well. It may respond to topical and/or systemic medication and can flare with stress, sun exposure, alcohol, exercise, topical steroids (including hydrocortisone/cortisone 10) and some foods.  Daily application of broad spectrum spf 30+ sunscreen to face is recommended to reduce flares.  Treatment Plan Stop azelaic acid 15 % cream Start metronidazole 0.75 % cream apply topically twice daily to aa's  Start doxycycline 100 mg qd take with food and  drink  Doxycycline should be taken with food to prevent nausea. Do not lay down for 30 minutes after taking. Be cautious with sun exposure and use good sun protection while on this medication. Pregnant women should not take this medication.   Discussed low dose isotretinoin if patient doesn't improve on treatment  Will recheck at next follow up in February    Sebaceous Hyperplasia - Small yellow papules with a central dell forehead - Benign-appearing - Observe. Call for changes.  Return for keep follow up as scheduled in february .  I, [name], CMA, am acting as scribe for Willeen Niece, MD.   Documentation: I have reviewed the above documentation for accuracy and completeness, and I agree with the above.  Willeen Niece, MD

## 2023-01-03 NOTE — Patient Instructions (Addendum)
Stop Azelaic Acid   Start doxycycline 100 mg capsule by mouth daily with food and drink  Doxycycline should be taken with food to prevent nausea. Do not lay down for 30 minutes after taking. Be cautious with sun exposure and use good sun protection while on this medication. Pregnant women should not take this medication.   Start metronidazole 0.75 % cream - apply topically to affected areas twice daily    Recommend moisturizer with sunscreen in morning  Should be noticing improvement after a month and continue treatment   Give 6 - 8 weeks if not getting appointment     Rosacea  What is rosacea? Rosacea (say: ro-zay-sha) is a common skin disease that usually begins as a trend of flushing or blushing easily.  As rosacea progresses, a persistent redness in the center of the face will develop and may gradually spread beyond the nose and cheeks to the forehead and chin.  In some cases, the ears, chest, and back could be affected.  Rosacea may appear as tiny blood vessels or small red bumps that occur in crops.  Frequently they can contain pus, and are called "pustules".  If the bumps do not contain pus, they are referred to as "papules".  Rarely, in prolonged, untreated cases of rosacea, the oil glands of the nose and cheeks may become permanently enlarged.  This is called rhinophyma, and is seen more frequently in men.  Signs and Risks In its beginning stages, rosacea tends to come and go, which makes it difficult to recognize.  It can start as intermittent flushing of the face.  Eventually, blood vessels may become permanently visible.  Pustules and papules can appear, but can be mistaken for adult acne.  People of all races, ages, genders and ethnic groups are at risk of developing rosacea.  However, it is more common in women (especially around menopause) and adults with fair skin between the ages of 14 and 63.  Treatment Dermatologists typically recommend a combination of treatments to  effectively manage rosacea.  Treatment can improve symptoms and may stop the progression of the rosacea.  Treatment may involve both topical and oral medications.  The tetracycline antibiotics are often used for their anti-inflammatory effect; however, because of the possibility of developing antibiotic resistance, they should not be used long term at full dose.  For dilated blood vessels the options include electrodessication (uses electric current through a small needle), laser treatment, and cosmetics to hide the redness.   With all forms of treatment, improvement is a slow process, and patients may not see any results for the first 3-4 weeks.  It is very important to avoid the sun and other triggers.  Patients must wear sunscreen daily.  Skin Care Instructions: Cleanse the skin with a mild soap such as CeraVe cleanser, Cetaphil cleanser, or Dove soap once or twice daily as needed. Moisturize with Eucerin Redness Relief Daily Perfecting Lotion (has a subtle green tint), CeraVe Moisturizing Cream, or Oil of Olay Daily Moisturizer with sunscreen every morning and/or night as recommended. Makeup should be "non-comedogenic" (won't clog pores) and be labeled "for sensitive skin". Good choices for cosmetics are: Neutrogena, Almay, and Physician's Formula.  Any product with a green tint tends to offset a red complexion. If your eyes are dry and irritated, use artificial tears 2-3 times per day and cleanse the eyelids daily with baby shampoo.  Have your eyes examined at least every 2 years.  Be sure to tell your eye doctor that you  have rosacea. Alcoholic beverages tend to cause flushing of the skin, and may make rosacea worse. Always wear sunscreen, protect your skin from extreme hot and cold temperatures, and avoid spicy foods, hot drinks, and mechanical irritation such as rubbing, scrubbing, or massaging the face.  Avoid harsh skin cleansers, cleansing masks, astringents, and exfoliation. If a particular  product burns or makes your face feel tight, then it is likely to flare your rosacea. If you are having difficulty finding a sunscreen that you can tolerate, you may try switching to a chemical-free sunscreen.  These are ones whose active ingredient is zinc oxide or titanium dioxide only.  They should also be fragrance free, non-comedogenic, and labeled for sensitive skin. Rosacea triggers may vary from person to person.  There are a variety of foods that have been reported to trigger rosacea.  Some patients find that keeping a diary of what they were doing when they flared helps them avoid triggers.       Gentle Skin Care Guide  1. Bathe no more than once a day.  2. Avoid bathing in hot water  3. Use a mild soap like Dove, Vanicream, Cetaphil, CeraVe. Can use Lever 2000 or Cetaphil antibacterial soap  4. Use soap only where you need it. On most days, use it under your arms, between your legs, and on your feet. Let the water rinse other areas unless visibly dirty.  5. When you get out of the bath/shower, use a towel to gently blot your skin dry, don't rub it.  6. While your skin is still a little damp, apply a moisturizing cream such as Vanicream, CeraVe, Cetaphil, Eucerin, Sarna lotion or plain Vaseline Jelly. For hands apply Neutrogena [place] Hand Cream or Excipial Hand Cream.  7. Reapply moisturizer any time you start to itch or feel dry.  8. Sometimes using free and clear laundry detergents can be helpful. Fabric softener sheets should be avoided. Downy Free & Gentle liquid, or any liquid fabric softener that is free of dyes and perfumes, it acceptable to use  9. If your doctor has given you prescription creams you may apply moisturizers over them       Due to recent changes in healthcare laws, you may see results of your pathology and/or laboratory studies on MyChart before the doctors have had a chance to review them. We understand that in some cases there may be results  that are confusing or concerning to you. Please understand that not all results are received at the same time and often the doctors may need to interpret multiple results in order to provide you with the best plan of care or course of treatment. Therefore, we ask that you please give [place] 2 business days to thoroughly review all your results before contacting the office for clarification. Should we see a critical lab result, you will be contacted sooner.   If You Need Anything After Your Visit  If you have any questions or concerns for your doctor, please call our main line at [phone] and press option 4 to reach your doctor's medical assistant. If no one answers, please leave a voicemail as directed and we will return your call as soon as possible. Messages left after 4 pm will be answered the following business day.   You may also send [place] a message via MyChart. We typically respond to MyChart messages within 1-2 business days.  For prescription refills, please ask your pharmacy to contact our office. Our fax number is [phone].  If you have an urgent issue when the clinic is closed that cannot wait until the next business day, you can page your doctor at the number below.    Please note that while we do our best to be available for urgent issues outside of office hours, we are not available 24/7.   If you have an urgent issue and are unable to reach [place], you may choose to seek medical care at your doctor's office, retail clinic, urgent care center, or emergency room.  If you have a medical emergency, please immediately call 911 or go to the emergency department.  Pager Numbers  - Dr. Gwen Pounds: [phone]  - Dr. Roseanne Reno: [phone]  - Dr. Katrinka Blazing: [phone]   In the event of inclement weather, please call our main line at [phone] for an update on the status of any delays or closures.  Dermatology Medication Tips: Please keep the boxes that topical medications come in in  order to help keep track of the instructions about where and how to use these. Pharmacies typically print the medication instructions only on the boxes and not directly on the medication tubes.   If your medication is too expensive, please contact our office at [phone] option 4 or send [place] a message through MyChart.   We are unable to tell what your co-pay for medications will be in advance as this is different depending on your insurance coverage. However, we may be able to find a substitute medication at lower cost or fill out paperwork to get insurance to cover a needed medication.   If a prior authorization is required to get your medication covered by your insurance company, please allow [place] 1-2 business days to complete this process.  Drug prices often vary depending on where the prescription is filled and some pharmacies may offer cheaper prices.  The website www.goodrx.com contains coupons for medications through different pharmacies. The prices here do not account for what the cost may be with help from insurance (it may be cheaper with your insurance), but the website can give you the price if you did not use any insurance.  - You can print the associated coupon and take it with your prescription to the pharmacy.  - You may also stop by our office during regular business hours and pick up a GoodRx coupon card.  - If you need your prescription sent electronically to a different pharmacy, notify our office through [facility] or by phone at [phone] option 4.     Si Usted Necesita Algo Despus de Su Visita  Tambin puede enviarnos un mensaje a travs de Clinical cytogeneticist. Por lo general respondemos a los mensajes de MyChart en el transcurso de 1 a 2 das hbiles.  Para renovar recetas, por favor pida a su farmacia que se ponga en contacto con nuestra oficina. Annie Sable de fax es [place] [phone].  Si tiene un asunto urgente cuando la clnica est cerrada y que no puede  esperar hasta el siguiente da hbil, puede llamar/localizar a su doctor(a) al nmero que aparece a continuacin.   Por favor, tenga en cuenta que aunque hacemos todo lo posible para estar disponibles para asuntos urgentes fuera del horario de [place], no estamos disponibles las 24 horas del da, los 7 [address]  [address] de la [place].   Si tiene un problema urgente y no puede comunicarse con nosotros, puede optar por buscar atencin mdica  en el consultorio de su doctor(a), en una clnica privada, en un centro de atencin urgente o  en una sala de emergencias.  Si tiene Engineer, drilling, por favor llame inmediatamente al 911 o vaya a la sala de emergencias.  Nmeros de bper  - Dr. Gwen Pounds: [phone]  - Dra. Roseanne Reno: [phone]  - Dr. Katrinka Blazing: [phone]   En caso de inclemencias del tiempo, por favor llame a Lacy Duverney principal al [phone] para una actualizacin sobre el [place] de cualquier retraso o cierre.  Consejos para la medicacin en dermatologa: Por favor, guarde las cajas en las que vienen los medicamentos de uso tpico para ayudarle a seguir las instrucciones sobre dnde y cmo usarlos. Las farmacias generalmente imprimen las instrucciones del medicamento slo en las cajas y no directamente en los tubos del [place].   Si su medicamento es muy caro, por favor, pngase en contacto con Rolm [place]ala llamando al [phone] y presione la opcin 4 o envenos un mensaje a travs de Clinical cytogeneticist.   No podemos decirle cul ser su copago por los medicamentos por adelantado ya que esto es diferente dependiendo de la cobertura de su seguro. Sin embargo, es posible que podamos encontrar un medicamento sustituto a Audiological scientist un formulario para que el seguro cubra el medicamento que se considera necesario.   Si se requiere una autorizacin previa para que su compaa de seguros [place] su medicamento, por favor permtanos de 1 a 2 das hbiles para completar [address]  Los  precios de los medicamentos varan con frecuencia dependiendo del Environmental consultant de dnde se surte la receta y alguna farmacias pueden ofrecer precios ms baratos.  El sitio web www.goodrx.com tiene cupones para medicamentos de Health and safety inspector. Los precios aqu no tienen en cuenta lo que podra costar con la ayuda del seguro (puede ser ms barato con su seguro), pero el sitio web puede darle el precio si no utiliz Tourist information centre manager.  - Puede imprimir el cupn correspondiente y llevarlo con su receta a la farmacia.  - Tambin puede pasar por nuestra oficina durante el horario de atencin regular y Education officer, museum una tarjeta de cupones de GoodRx.  - Si necesita que su receta se enve electrnicamente a una farmacia diferente, informe a nuestra oficina a travs de MyChart de [place] o por telfono llamando al [phone] y presione la opcin 4.

## 2023-02-21 ENCOUNTER — Other Ambulatory Visit: Payer: Self-pay | Admitting: Family Medicine

## 2023-02-21 DIAGNOSIS — Z1231 Encounter for screening mammogram for malignant neoplasm of breast: Secondary | ICD-10-CM

## 2023-03-02 ENCOUNTER — Ambulatory Visit (INDEPENDENT_AMBULATORY_CARE_PROVIDER_SITE_OTHER): Payer: Medicare Other | Admitting: Family Medicine

## 2023-03-02 ENCOUNTER — Encounter: Payer: Self-pay | Admitting: Family Medicine

## 2023-03-02 VITALS — BP 141/80 | HR 47 | Ht 65.5 in | Wt 216.0 lb

## 2023-03-02 DIAGNOSIS — I10 Essential (primary) hypertension: Secondary | ICD-10-CM

## 2023-03-02 DIAGNOSIS — I4819 Other persistent atrial fibrillation: Secondary | ICD-10-CM

## 2023-03-02 DIAGNOSIS — D6959 Other secondary thrombocytopenia: Secondary | ICD-10-CM | POA: Diagnosis not present

## 2023-03-02 DIAGNOSIS — E538 Deficiency of other specified B group vitamins: Secondary | ICD-10-CM | POA: Diagnosis not present

## 2023-03-02 DIAGNOSIS — E7800 Pure hypercholesterolemia, unspecified: Secondary | ICD-10-CM

## 2023-03-02 DIAGNOSIS — T50905A Adverse effect of unspecified drugs, medicaments and biological substances, initial encounter: Secondary | ICD-10-CM

## 2023-03-02 DIAGNOSIS — R7303 Prediabetes: Secondary | ICD-10-CM

## 2023-03-02 DIAGNOSIS — I6930 Unspecified sequelae of cerebral infarction: Secondary | ICD-10-CM

## 2023-03-02 DIAGNOSIS — Z0001 Encounter for general adult medical examination with abnormal findings: Secondary | ICD-10-CM

## 2023-03-02 DIAGNOSIS — Z0000 Encounter for general adult medical examination without abnormal findings: Secondary | ICD-10-CM

## 2023-03-02 DIAGNOSIS — Z1329 Encounter for screening for other suspected endocrine disorder: Secondary | ICD-10-CM

## 2023-03-02 DIAGNOSIS — K219 Gastro-esophageal reflux disease without esophagitis: Secondary | ICD-10-CM | POA: Diagnosis not present

## 2023-03-02 NOTE — Patient Instructions (Signed)
Fall Prevention in the Home, Adult Falls can cause injuries and can happen to people of all ages. There are many things you can do to make your home safer and to help prevent falls. What actions can I take to prevent falls? General information Use good lighting in all rooms. Make sure to: Replace any light bulbs that burn out. Turn on the lights in dark areas and use night-lights. Keep items that you use often in easy-to-reach places. Lower the shelves around your home if needed. Move furniture so that there are clear paths around it. Do not use throw rugs or other things on the floor that can make you trip. If any of your floors are uneven, fix them. Add color or contrast paint or tape to clearly mark and help you see: Grab bars or handrails. First and last steps of staircases. Where the edge of each step is. If you use a ladder or stepladder: Make sure that it is fully opened. Do not climb a closed ladder. Make sure the sides of the ladder are locked in place. Have someone hold the ladder while you use it. Know where your pets are as you move through your home. What can I do in the bathroom?     Keep the floor dry. Clean up any water on the floor right away. Remove soap buildup in the bathtub or shower. Buildup makes bathtubs and showers slippery. Use non-skid mats or decals on the floor of the bathtub or shower. Attach bath mats securely with double-sided, non-slip rug tape. If you need to sit down in the shower, use a non-slip stool. Install grab bars by the toilet and in the bathtub and shower. Do not use towel bars as grab bars. What can I do in the bedroom? Make sure that you have a light by your bed that is easy to reach. Do not use any sheets or blankets on your bed that hang to the floor. Have a firm chair or bench with side arms that you can use for support when you get dressed. What can I do in the kitchen? Clean up any spills right away. If you need to reach something  above you, use a step stool with a grab bar. Keep electrical cords out of the way. Do not use floor polish or wax that makes floors slippery. What can I do with my stairs? Do not leave anything on the stairs. Make sure that you have a light switch at the top and the bottom of the stairs. Make sure that there are handrails on both sides of the stairs. Fix handrails that are broken or loose. Install non-slip stair treads on all your stairs if they do not have carpet. Avoid having throw rugs at the top or bottom of the stairs. Choose a carpet that does not hide the edge of the steps on the stairs. Make sure that the carpet is firmly attached to the stairs. Fix carpet that is loose or worn. What can I do on the outside of my home? Use bright outdoor lighting. Fix the edges of walkways and driveways and fix any cracks. Clear paths of anything that can make you trip, such as tools or rocks. Add color or contrast paint or tape to clearly mark and help you see anything that might make you trip as you walk through a door, such as a raised step or threshold. Trim any bushes or trees on paths to your home. Check to see if handrails are loose  or broken and that both sides of all steps have handrails. Install guardrails along the edges of any raised decks and porches. Have leaves, snow, or ice cleared regularly. Use sand, salt, or ice melter on paths if you live where there is ice and snow during the winter. Clean up any spills in your garage right away. This includes grease or oil spills. What other actions can I take? Review your medicines with your doctor. Some medicines can cause dizziness or changes in blood pressure, which increase your risk of falling. Wear shoes that: Have a low heel. Do not wear high heels. Have rubber bottoms and are closed at the toe. Feel good on your feet and fit well. Use tools that help you move around if needed. These include: Canes. Walkers. Scooters. Crutches. Ask  your doctor what else you can do to help prevent falls. This may include seeing a physical therapist to learn to do exercises to move better and get stronger. Where to find more information Centers for Disease Control and Prevention, STEADI: TonerPromos.no [employer] on Aging: BaseRingTones.pl National Institute on Aging: BaseRingTones.pl Contact a doctor if: You are afraid of falling at home. You feel weak, drowsy, or dizzy at home. You fall at home. Get help right away if you: Lose consciousness or have trouble moving after a fall. Have a fall that causes a head injury. These symptoms may be an emergency. Get help right away. Call 911. Do not wait to see if the symptoms will go away. Do not drive yourself to the hospital. This information is not intended to replace advice given to you by your health care provider. Make sure you discuss any questions you have with your health care provider. Document Revised: 10/05/2021 Document Reviewed: 10/05/2021 Elsevier Patient Education  2024 Elsevier Inc.  Health Maintenance, Female Adopting a healthy lifestyle and getting preventive care are important in promoting health and wellness. Ask your health care provider about: The right schedule for you to have regular tests and exams. Things you can do on your own to prevent diseases and keep yourself healthy. What should I know about diet, weight, and exercise? Eat a healthy diet  Eat a diet that includes plenty of vegetables, fruits, low-fat dairy products, and lean protein. Do not eat a lot of foods that are high in solid fats, added sugars, or sodium. Maintain a healthy weight Body mass index (BMI) is used to identify weight problems. It estimates body fat based on height and weight. Your health care provider can help determine your BMI and help you achieve or maintain a healthy weight. Get regular exercise Get regular exercise. This is one of the most important things you can do for your health. Most  adults should: Exercise for at least 150 minutes each week. The exercise should increase your heart rate and make you sweat (moderate-intensity exercise). Do strengthening exercises at least twice a week. This is in addition to the moderate-intensity exercise. Spend less time sitting. Even light physical activity can be beneficial. Watch cholesterol and blood lipids Have your blood tested for lipids and cholesterol at 74 years of age, then have this test every 5 years. Have your cholesterol levels checked more often if: Your lipid or cholesterol levels are high. You are older than 74 years of age. You are at high risk for heart disease. What should I know about cancer screening? Depending on your health history and family history, you may need to have cancer screening at various ages. This may  include screening for: Breast cancer. Cervical cancer. Colorectal cancer. Skin cancer. Lung cancer. What should I know about heart disease, diabetes, and high blood pressure? Blood pressure and heart disease High blood pressure causes heart disease and increases the risk of stroke. This is more likely to develop in people who have high blood pressure readings or are overweight. Have your blood pressure checked: Every 3-5 years if you are 69-34 years of age. Every year if you are 24 years old or older. Diabetes Have regular diabetes screenings. This checks your fasting blood sugar level. Have the screening done: Once every three years after age 5 if you are at a normal weight and have a low risk for diabetes. More often and at a younger age if you are overweight or have a high risk for diabetes. What should I know about preventing infection? Hepatitis B If you have a higher risk for hepatitis B, you should be screened for this virus. Talk with your health care provider to find out if you are at risk for hepatitis B infection. Hepatitis C Testing is recommended for: Everyone born from [birth year]  through 1965. Anyone with known risk factors for hepatitis C. Sexually transmitted infections (STIs) Get screened for STIs, including gonorrhea and chlamydia, if: You are sexually active and are younger than 74 years of age. You are older than 74 years of age and your health care provider tells you that you are at risk for this type of infection. Your sexual activity has changed since you were last screened, and you are at increased risk for chlamydia or gonorrhea. Ask your health care provider if you are at risk. Ask your health care provider about whether you are at high risk for HIV. Your health care provider may recommend a prescription medicine to help prevent HIV infection. If you choose to take medicine to prevent HIV, you should first get tested for HIV. You should then be tested every 3 months for as long as you are taking the medicine. Pregnancy If you are about to stop having your period (premenopausal) and you may become pregnant, seek counseling before you get pregnant. Take 400 to 800 micrograms (mcg) of folic acid every day if you become pregnant. Ask for birth control (contraception) if you want to prevent pregnancy. Osteoporosis and menopause Osteoporosis is a disease in which the bones lose minerals and strength with aging. This can result in bone fractures. If you are 17 years old or older, or if you are at risk for osteoporosis and fractures, ask your health care provider if you should: Be screened for bone loss. Take a calcium or vitamin D supplement to lower your risk of fractures. Be given hormone replacement therapy (HRT) to treat symptoms of menopause. Follow these instructions at home: Alcohol use Do not drink alcohol if: Your health care provider tells you not to drink. You are pregnant, may be pregnant, or are planning to become pregnant. If you drink alcohol: Limit how much you have to: 0-1 drink a day. Know how much alcohol is in your drink. In the [place] one  drink equals one 12 oz bottle of beer (355 mL), one 5 oz glass of wine (148 mL), or one 1 oz glass of hard liquor (44 mL). Lifestyle Do not use any products that contain nicotine or tobacco. These products include cigarettes, chewing tobacco, and vaping devices, such as e-cigarettes. If you need help quitting, ask your health care provider. Do not use street drugs. Do not share needles.  Ask your health care provider for help if you need support or information about quitting drugs. General instructions Schedule regular health, dental, and eye exams. Stay current with your vaccines. Tell your health care provider if: You often feel depressed. You have ever been abused or do not feel safe at home. Summary Adopting a healthy lifestyle and getting preventive care are important in promoting health and wellness. Follow your health care provider's instructions about healthy diet, exercising, and getting tested or screened for diseases. Follow your health care provider's instructions on monitoring your cholesterol and blood pressure. This information is not intended to replace advice given to you by your health care provider. Make sure you discuss any questions you have with your health care provider. Document Revised: 06/23/2020 Document Reviewed: 06/23/2020 Elsevier Patient Education  2024 [employer].

## 2023-03-02 NOTE — Assessment & Plan Note (Addendum)
 [age]-year-old presenting for an annual wellness visit and complete physical exam. Last physical was on January 10. Screening for diabetes, hyperlipidemia, hypertension, thyroid  function, and vitamin B12 levels. Drug-induced thrombocytopenia necessitates CBC measurement.   Recommended low impact exercise as tolerated given cardiac history  Recommended well balanced diet  Mammogram is scheduled  Chronic conditions are stable, continue current management - Screen for diabetes with hemoglobin A1c   - lipid panel  - Complete metabolic panel   - Measure TSH   - Measure vitamin B12   - Measure CBC

## 2023-03-02 NOTE — Progress Notes (Signed)
 Complete physical exam   Patient: Sharon Small   DOB: Oct 07, 1950   73 y.o. Female  MRN: [id number] Visit Date: 03/02/2023  Today's healthcare provider: Mimi Alt, MD   Chief Complaint  Patient presents with   Annual Exam   Subjective    Sharon Small is a 73 y.o. female who presents today for a complete physical exam.   She reports consuming a general diet.   The patient does not participate in regular exercise at present.   She generally feels well.   She reports sleeping well.    She does not have additional problems to discuss today.   Discussed the use of AI scribe software for clinical note transcription with the patient, who gave verbal consent to proceed.  History of Present Illness   The patient is a 73 year old individual who presented for an annual wellness visit and complete physical exam. She has a history of reflux, drug-induced thrombocytopenia, and hypertension. The patient also has a history of atrial fibrillation, for which she underwent cardioversion. She is currently on flecainide  twice daily to maintain a low heart rate as per her cardiologist's advice. However, she reported feeling low on energy, which she attributes to her low heart rate and blood pressure.  The patient also reported experiencing balance issues since her stroke, particularly when standing on one leg or walking heel-to-toe. She noted that her right leg seems to be more affected than her left, which aligns with her history of a left-sided stroke. Despite these balance issues, the patient reported being able to conduct her daily routine and participate in family events, although she needs a day of rest after such activities.  The patient also reported some concerns about her physical appearance, specifically pigmentation changes on her legs. She described darker areas where her capris hit and white spots where the pigment is gone. She was reassured that these changes  appear to be sunspots rather than signs of circulatory problems.  The patient expressed a desire to improve her physical health but admitted to a lack of energy and a dislike for exercise. She mentioned receiving a leg exerciser as a gift and was considering using it to improve circulation and muscle strength. She also expressed a reluctance to undergo elective surgery, such as knee replacement, due to her current medication regimen and the risk of blood clots.  In terms of family history, the patient mentioned a daughter who is 27 years old and a granddaughter who is a Engineer, petroleum at [employer]. She also has another granddaughter who graduated from the business school at [employer] and currently works for [employer]. The patient expressed a desire to keep up with her active family members and participate in family events.      Past Medical History:  Diagnosis Date   CVA (cerebral vascular accident) (HCC)    GERD (gastroesophageal reflux disease)    Hypertension    Snores    Past Surgical History:  Procedure Laterality Date   CARDIOVERSION N/A 05/14/2022   Procedure: CARDIOVERSION;  Surgeon: Devorah Fonder, MD;  Location: [facility];  Service: Cardiovascular;  Laterality: N/A;   CATARACT EXTRACTION Bilateral 2003 & 2005   COLONOSCOPY     COLONOSCOPY WITH PROPOFOL  N/A 07/04/2015   Procedure: COLONOSCOPY WITH PROPOFOL ;  Surgeon: Marnee Sink, MD;  Location: [facility];  Service: Endoscopy;  Laterality: N/A;  prefers early   VAGINAL HYSTERECTOMY  2005   Bilateral oophorectomy   Social History   Socioeconomic  History   Marital status: Married    Spouse name: Not on file   Number of children: Not on file   Years of education: Not on file   Highest education level: 12th grade  Occupational History   Not on file  Tobacco Use   Smoking status: Never   Smokeless tobacco: Never  Vaping Use   Vaping status: Never Used  Substance and Sexual Activity   Alcohol use: No   Drug use: No    Sexual activity: Yes  Other Topics Concern   Not on file  Social History Narrative   Not on file   Social Drivers of Health   Financial Resource Strain: Low Risk  (03/01/2023)   Overall Financial Resource Strain (CARDIA)    Difficulty of Paying Living Expenses: Not hard at all  Food Insecurity: No Food Insecurity (03/01/2023)   Hunger Vital Sign    Worried About Running Out of Food in the Last Year: Never true    Ran Out of Food in the Last Year: Never true  Transportation Needs: No Transportation Needs (03/01/2023)   PRAPARE - Administrator, Civil Service (Medical): No    Lack of Transportation (Non-Medical): No  Physical Activity: Insufficiently Active (03/01/2023)   Exercise Vital Sign    Days of Exercise per Week: 1 day    Minutes of Exercise per Session: 20 min  Stress: No Stress Concern Present (03/01/2023)   [employer] of Occupational Health - Occupational Stress Questionnaire    Feeling of Stress : Not at all  Social Connections: Moderately Integrated (03/01/2023)   Social Connection and Isolation Panel [NHANES]    Frequency of Communication with Friends and Family: More than three times a week    Frequency of Social Gatherings with Friends and Family: Three times a week    Attends Religious Services: More than 4 times per year    Active Member of Clubs or Organizations: No    Attends Banker Meetings: Not on file    Marital Status: Married  Intimate Partner Violence: Not At Risk (03/02/2023)   Humiliation, Afraid, Rape, and Kick questionnaire    Fear of Current or Ex-Partner: No    Emotionally Abused: No    Physically Abused: No    Sexually Abused: No   Family Status  Relation Name Status   Mother  Deceased at age 63       due to COPD   Father  Deceased at age 86       cause of death MI   Sister 1 Alive   Sister 2 Alive   Daughter  Alive   Son  Alive   Daughter  Chemical engineer  (Not Specified)  No partnership data on file    Family History  Problem Relation Age of Onset   COPD Mother    Peripheral Artery Disease Mother    Osteoporosis Mother    Heart disease Father        fatal MI age 92   Hypertension Sister    Glaucoma Sister    Breast cancer Maternal Aunt 66   No Known Allergies   Medications: Outpatient Medications Prior to Visit  Medication Sig   diltiazem  (CARDIZEM  CD) 120 MG 24 hr capsule Take 1 capsule (120 mg total) by mouth 2 (two) times daily.   doxycycline  (MONODOX ) 100 MG capsule Take 1 capsule by mouth daily with food and drink   ELIQUIS  5 MG TABS tablet TAKE 1  TABLET(5 MG) BY MOUTH TWICE DAILY   flecainide  (TAMBOCOR ) 100 MG tablet Take 1 tablet (100 mg total) by mouth 2 (two) times daily.   Glucosamine HCl (GLUCOSAMINE PO) Take by mouth daily.   MAGNESIUM PO Take by mouth daily as needed. For muscle spasms   metroNIDAZOLE  (METROCREAM ) 0.75 % cream Apply topically to affected areas twice daily   omeprazole (PRILOSEC) 20 MG capsule Take 20 mg by mouth daily.   rosuvastatin  (CRESTOR ) 40 MG tablet TAKE 1 TABLET(40 MG) BY MOUTH DAILY   valsartan -hydrochlorothiazide  (DIOVAN  HCT) 160-12.5 MG tablet Take 1 tablet by mouth daily.   albuterol  (VENTOLIN  HFA) 108 (90 Base) MCG/ACT inhaler Inhale 2 puffs into the lungs every 6 (six) hours as needed for wheezing or shortness of breath. (Patient not taking: Reported on 03/02/2023)   Azelaic Acid  15 % gel Apply a small amount to skin 1-2 times daily as needed.   Multiple Vitamin (MULTIVITAMIN ADULT PO) Take 1 tablet by mouth daily. (Patient not taking: Reported on 03/02/2023)   No facility-administered medications prior to visit.    Review of Systems  Last CBC Lab Results  Component Value Date   WBC 6.3 05/04/2022   HGB 13.3 05/04/2022   HCT 40.9 05/04/2022   MCV 91.5 05/04/2022   MCH 29.8 05/04/2022   RDW 13.2 05/04/2022   PLT 267 05/04/2022   Last metabolic panel Lab Results  Component Value Date   GLUCOSE 92 05/04/2022   NA 141  05/04/2022   K 3.9 05/04/2022   CL 106 05/04/2022   CO2 28 05/04/2022   BUN 20 05/04/2022   CREATININE 0.83 05/04/2022   GFRNONAA >60 05/04/2022   CALCIUM  9.2 05/04/2022   PROT 7.2 03/01/2022   ALBUMIN 4.7 03/01/2022   LABGLOB 2.5 03/01/2022   AGRATIO 1.9 03/01/2022   BILITOT 0.4 03/01/2022   ALKPHOS 64 03/01/2022   AST 20 03/01/2022   ALT 21 03/01/2022   ANIONGAP 7 05/04/2022   Last lipids Lab Results  Component Value Date   CHOL 130 03/01/2022   HDL 57 03/01/2022   LDLCALC 52 03/01/2022   TRIG 116 03/01/2022   CHOLHDL 2.3 03/01/2022   Last hemoglobin A1c Lab Results  Component Value Date   HGBA1C 5.9 (H) 03/01/2022   Last thyroid  functions Lab Results  Component Value Date   TSH 2.220 10/28/2020   Last vitamin D Lab Results  Component Value Date   VD25OH 40.8 01/08/2020       Objective    BP (!) 141/80 (BP Location: Right Arm, Patient Position: Sitting, Cuff Size: Normal) Comment: MAP 100  Pulse (!) 47   Ht 5' 5.5" (1.664 m)   Wt 216 lb (98 kg)   SpO2 100%   BMI 35.40 kg/m   BP Readings from Last 3 Encounters:  03/02/23 (!) 141/80  12/13/22 (!) 150/80  07/20/22 127/62   Wt Readings from Last 3 Encounters:  03/02/23 216 lb (98 kg)  12/13/22 218 lb 6 oz (99.1 kg)  07/20/22 214 lb (97.1 kg)        Physical Exam Vitals reviewed.  Constitutional:      General: She is not in acute distress.    Appearance: Normal appearance. She is not ill-appearing, toxic-appearing or diaphoretic.  HENT:     Head: Normocephalic and atraumatic.     Right Ear: Tympanic membrane and external ear normal. There is no impacted cerumen.     Left Ear: Tympanic membrane and external ear normal. There is no impacted cerumen.  Nose: Nose normal.     Mouth/Throat:     Pharynx: Oropharynx is clear.  Eyes:     General: No scleral icterus.    Extraocular Movements: Extraocular movements intact.     Conjunctiva/sclera: Conjunctivae normal.     Pupils: Pupils are  equal, round, and reactive to light.  Cardiovascular:     Rate and Rhythm: Regular rhythm. Bradycardia present.     Pulses: Normal pulses.     Heart sounds: Normal heart sounds. No murmur heard.    No friction rub. No gallop.  Pulmonary:     Effort: Pulmonary effort is normal. No respiratory distress.     Breath sounds: Normal breath sounds. No wheezing, rhonchi or rales.  Abdominal:     General: Bowel sounds are normal. There is no distension.     Palpations: Abdomen is soft. There is no mass.     Tenderness: There is no abdominal tenderness. There is no guarding.  Musculoskeletal:        General: No deformity.     Cervical back: Normal range of motion and neck supple. No rigidity.     Right lower leg: No edema.     Left lower leg: No edema.  Lymphadenopathy:     Cervical: No cervical adenopathy.  Skin:    General: Skin is warm.     Capillary Refill: Capillary refill takes less than 2 seconds.     Findings: No erythema or rash.  Neurological:     General: No focal deficit present.     Mental Status: She is alert and oriented to person, place, and time.     Motor: No weakness.     Gait: Gait normal.     Comments: Initial difficulty with tandem ambulation Normal gait otherwise   Psychiatric:        Mood and Affect: Mood normal.        Behavior: Behavior normal.       Last depression screening scores    03/02/2023   11:08 AM 07/20/2022    9:58 AM 05/31/2022    9:34 AM  PHQ 2/9 Scores  PHQ - 2 Score 0 1 0  PHQ- 9 Score 4 4     Last fall risk screening    03/02/2023   11:06 AM  Fall Risk   Falls in the past year? 1  Number falls in past yr: 0  Injury with Fall? 0  Risk for fall due to : No Fall Risks  Follow up Falls evaluation completed    Last Audit-C alcohol use screening    03/01/2023   10:28 AM  Alcohol Use Disorder Test (AUDIT)  1. How often do you have a drink containing alcohol? 0  3. How often do you have six or more drinks on one occasion? 0   A  score of 3 or more in women, and 4 or more in men indicates increased risk for alcohol abuse, EXCEPT if all of the points are from question 1   No results found for any visits on 03/02/23.  Assessment & Plan    Routine Health Maintenance and Physical Exam  Immunization History  Administered Date(s) Administered   Fluad Quad(high Dose 65+) 12/11/2019   Influenza, High Dose Seasonal PF 11/16/2016, 11/03/2017, 10/25/2018   Influenza,inj,Quad PF,6+ Mos 11/17/2015   PFIZER(Purple Top)SARS-COV-2 Vaccination 04/09/2019, 04/30/2019   Pfizer Covid-19 Vaccine Bivalent Booster 53yrs & up 01/16/2020   Pneumococcal Conjugate-13 11/16/2016   Pneumococcal Polysaccharide-23 01/05/2018   Tdap 11/23/2006  Zoster Recombinant(Shingrix ) 11/03/2017, 01/05/2018   Zoster, Live 07/28/2011    Health Maintenance  Topic Date Due   COVID-19 Vaccine (4 - 2024-25 season) 10/17/2022   MAMMOGRAM  03/18/2023   Medicare Annual Wellness (AWV)  03/01/2024   Colonoscopy  07/03/2025   Pneumonia Vaccine 50+ Years old  Completed   INFLUENZA VACCINE  Completed   DEXA SCAN  Completed   Hepatitis C Screening  Completed   Zoster Vaccines- Shingrix   Completed   HPV VACCINES  Aged Out   DTaP/Tdap/Td  Discontinued    Problem List Items Addressed This Visit       Cardiovascular and Mediastinum   Persistent atrial fibrillation (HCC)   Essential (primary) hypertension   Relevant Orders   Comprehensive metabolic panel     Digestive   Acid reflux     Hematopoietic and Hemostatic   Drug-induced thrombocytopenia   Relevant Orders   CBC     Other   HLD (hyperlipidemia)   Relevant Orders   Lipid panel   History of stroke with residual effects   Relevant Orders   Ambulatory referral to Physical Therapy   Encounter for Medicare annual wellness exam - Primary   [age]-year-old presenting for an annual wellness visit and complete physical exam. Last physical was on January 10. Screening for diabetes,  hyperlipidemia, hypertension, thyroid  function, and vitamin B12 levels. Drug-induced thrombocytopenia necessitates CBC measurement.   Recommended low impact exercise as tolerated given cardiac history  Recommended well balanced diet  Mammogram is scheduled  Chronic conditions are stable, continue current management - Screen for diabetes with hemoglobin A1c   - lipid panel  - Complete metabolic panel   - Measure TSH   - Measure vitamin B12   - Measure CBC        Relevant Orders   Basic metabolic panel   Lipid panel   Comprehensive metabolic panel   Borderline diabetes   Relevant Orders   Hemoglobin A1c   Annual physical exam   Other Visit Diagnoses       B12 deficiency       Relevant Orders   Vitamin B12     Thyroid  disorder screening       Relevant Orders   TSH            Atrial Fibrillation   Managed with flecainide . Reports low energy levels and a heart rate of 45-47 bpm. Cardiologist aims to keep heart rate low. Prefers to avoid elective knee replacement surgery due to concerns about blood clots and complications from being on Eliquis .   - Continue flecainide  twice daily    Post-Stroke Sequelae   Residual balance issues and right leg weakness. Difficulty with balance, especially when standing on one leg and walking on inclines. No falls reported. Considering physical therapy for balance and muscle strengthening exercises.   - Refer to physical therapy for balance and muscle strengthening exercises    Hypertension   Blood pressure management is well-controlled per cardiologist.   - Monitor blood pressure    Hyperlipidemia   Cholesterol levels to be tested.   - Test cholesterol levels    Drug-Induced Thrombocytopenia   CBC to be measured.   - Measure CBC    Gastroesophageal Reflux Disease (GERD)   Reflux management.   - Monitor symptoms    General Health Maintenance   Routine health maintenance and screenings.   - Schedule follow-up in six months   -  Return for fasting blood work on Friday  Follow-up   - Schedule follow-up in six months   - Return for fasting blood work on Friday     Return in about 6 months (around 08/30/2023) for CHRONIC F/U.       Mimi Alt, MD  [facility] [phone] (phone) [phone] (fax)  [facility]

## 2023-03-04 DIAGNOSIS — E538 Deficiency of other specified B group vitamins: Secondary | ICD-10-CM | POA: Diagnosis not present

## 2023-03-04 DIAGNOSIS — R7303 Prediabetes: Secondary | ICD-10-CM | POA: Diagnosis not present

## 2023-03-04 DIAGNOSIS — Z0000 Encounter for general adult medical examination without abnormal findings: Secondary | ICD-10-CM | POA: Diagnosis not present

## 2023-03-04 DIAGNOSIS — E7800 Pure hypercholesterolemia, unspecified: Secondary | ICD-10-CM | POA: Diagnosis not present

## 2023-03-04 DIAGNOSIS — Z1329 Encounter for screening for other suspected endocrine disorder: Secondary | ICD-10-CM | POA: Diagnosis not present

## 2023-03-05 LAB — COMPREHENSIVE METABOLIC PANEL
ALT: 20 [IU]/L (ref 0–32)
AST: 16 [IU]/L (ref 0–40)
Albumin: 4.4 g/dL (ref 3.8–4.8)
Alkaline Phosphatase: 67 [IU]/L (ref 44–121)
BUN/Creatinine Ratio: 24 (ref 12–28)
BUN: 18 mg/dL (ref 8–27)
Bilirubin Total: 0.3 mg/dL (ref 0.0–1.2)
CO2: 23 mmol/L (ref 20–29)
Calcium: 9.4 mg/dL (ref 8.7–10.3)
Chloride: 103 mmol/L (ref 96–106)
Creatinine, Ser: 0.75 mg/dL (ref 0.57–1.00)
Globulin, Total: 2.3 g/dL (ref 1.5–4.5)
Glucose: 111 mg/dL — ABNORMAL HIGH (ref 70–99)
Potassium: 4.4 mmol/L (ref 3.5–5.2)
Sodium: 144 mmol/L (ref 134–144)
Total Protein: 6.7 g/dL (ref 6.0–8.5)
eGFR: 84 mL/min/{1.73_m2} (ref >59)

## 2023-03-05 LAB — HEMOGLOBIN A1C
Est. average glucose Bld gHb Est-mCnc: 128 mg/dL
Hgb A1c MFr Bld: 6.1 % — ABNORMAL HIGH (ref 4.8–5.6)

## 2023-03-05 LAB — LIPID PANEL
Chol/HDL Ratio: 2.5 {ratio} (ref 0.0–4.4)
Cholesterol, Total: 143 mg/dL (ref 100–199)
HDL: 57 mg/dL (ref >39)
LDL Chol Calc (NIH): 66 mg/dL (ref 0–99)
Triglycerides: 110 mg/dL (ref 0–149)
VLDL Cholesterol Cal: 20 mg/dL (ref 5–40)

## 2023-03-05 LAB — CBC
Hematocrit: 41 % (ref 34.0–46.6)
Hemoglobin: 13.5 g/dL (ref 11.1–15.9)
MCH: 30.3 pg (ref 26.6–33.0)
MCHC: 32.9 g/dL (ref 31.5–35.7)
MCV: 92 fL (ref 79–97)
Platelets: 274 10*3/uL (ref 150–450)
RBC: 4.46 x10E6/uL (ref 3.77–5.28)
RDW: 12.2 % (ref 11.7–15.4)
WBC: 6.2 10*3/uL (ref 3.4–10.8)

## 2023-03-05 LAB — VITAMIN B12: Vitamin B-12: 536 pg/mL (ref 232–1245)

## 2023-03-05 LAB — TSH: TSH: 2.58 u[IU]/mL (ref 0.450–4.500)

## 2023-03-07 ENCOUNTER — Encounter: Payer: Self-pay | Admitting: Family Medicine

## 2023-03-20 ENCOUNTER — Other Ambulatory Visit: Payer: Self-pay | Admitting: Cardiovascular Disease

## 2023-03-21 ENCOUNTER — Ambulatory Visit
Admission: RE | Admit: 2023-03-21 | Discharge: 2023-03-21 | Disposition: A | Discharge status: Home / Self Care | Payer: Medicare Other | Source: Ambulatory Visit | Attending: Family Medicine | Admitting: Family Medicine

## 2023-03-21 DIAGNOSIS — Z1231 Encounter for screening mammogram for malignant neoplasm of breast: Secondary | ICD-10-CM | POA: Insufficient documentation

## 2023-03-22 ENCOUNTER — Encounter: Payer: Self-pay | Admitting: Family Medicine

## 2023-03-25 ENCOUNTER — Other Ambulatory Visit: Payer: Self-pay | Admitting: Cardiovascular Disease

## 2023-04-11 ENCOUNTER — Ambulatory Visit: Payer: Medicare Other | Admitting: Dermatology

## 2023-04-11 DIAGNOSIS — L817 Pigmented purpuric dermatosis: Secondary | ICD-10-CM

## 2023-04-11 DIAGNOSIS — D2339 Other benign neoplasm of skin of other parts of face: Secondary | ICD-10-CM

## 2023-04-11 DIAGNOSIS — L814 Other melanin hyperpigmentation: Secondary | ICD-10-CM

## 2023-04-11 DIAGNOSIS — Z1283 Encounter for screening for malignant neoplasm of skin: Secondary | ICD-10-CM

## 2023-04-11 DIAGNOSIS — W908XXA Exposure to other nonionizing radiation, initial encounter: Secondary | ICD-10-CM

## 2023-04-11 DIAGNOSIS — D1801 Hemangioma of skin and subcutaneous tissue: Secondary | ICD-10-CM

## 2023-04-11 DIAGNOSIS — L738 Other specified follicular disorders: Secondary | ICD-10-CM | POA: Diagnosis not present

## 2023-04-11 DIAGNOSIS — L821 Other seborrheic keratosis: Secondary | ICD-10-CM

## 2023-04-11 DIAGNOSIS — L709 Acne, unspecified: Secondary | ICD-10-CM | POA: Diagnosis not present

## 2023-04-11 DIAGNOSIS — L719 Rosacea, unspecified: Secondary | ICD-10-CM

## 2023-04-11 DIAGNOSIS — D239 Other benign neoplasm of skin, unspecified: Secondary | ICD-10-CM

## 2023-04-11 DIAGNOSIS — D229 Melanocytic nevi, unspecified: Secondary | ICD-10-CM

## 2023-04-11 DIAGNOSIS — L578 Other skin changes due to chronic exposure to nonionizing radiation: Secondary | ICD-10-CM | POA: Diagnosis not present

## 2023-04-11 DIAGNOSIS — D2361 Other benign neoplasm of skin of right upper limb, including shoulder: Secondary | ICD-10-CM

## 2023-04-11 DIAGNOSIS — D225 Melanocytic nevi of trunk: Secondary | ICD-10-CM | POA: Diagnosis not present

## 2023-04-11 DIAGNOSIS — L918 Other hypertrophic disorders of the skin: Secondary | ICD-10-CM

## 2023-04-11 MED ORDER — DOXYCYCLINE HYCLATE 20 MG PO TABS
ORAL_TABLET | ORAL | 6 refills | Status: DC
Start: 2023-04-11 — End: 2023-10-10

## 2023-04-11 MED ORDER — METRONIDAZOLE 0.75 % EX CREA: TOPICAL_CREAM | CUTANEOUS | 6 refills | Status: DC

## 2023-04-11 NOTE — Progress Notes (Signed)
 Follow-Up Visit   Subjective  Sharon Small is a 73 y.o. female who presents for the following: Skin Cancer Screening and Full Body Skin Exam Spot at left forehead/hairline to check, not bothersome  Treated for rosacea with acne at face using metronidazole cream and doxycycline 100 mg x 3 months . Finished doxycycline a week ago and noticed that she is improved. She wants to know if she needs to continue Doxy.   The patient presents for Total-Body Skin Exam (TBSE) for skin cancer screening and mole check. The patient has spots, moles and lesions to be evaluated, some may be new or changing and the patient may have concern these could be cancer.    The following portions of the chart were reviewed this encounter and updated as appropriate: medications, allergies, medical history  Review of Systems:  No other skin or systemic complaints except as noted in HPI or Assessment and Plan.  Objective  Well appearing patient in no apparent distress; mood and affect are within normal limits.  A full examination was performed including scalp, head, eyes, ears, nose, lips, neck, chest, axillae, abdomen, back, buttocks, bilateral upper extremities, bilateral lower extremities, hands, feet, fingers, toes, fingernails, and toenails. All findings within normal limits unless otherwise noted below.   Relevant physical exam findings are noted in the Assessment and Plan.    Assessment & Plan   SKIN CANCER SCREENING PERFORMED TODAY.  ACTINIC DAMAGE - Chronic condition, secondary to cumulative UV/sun exposure - diffuse scaly erythematous macules with underlying dyspigmentation - Recommend daily broad spectrum sunscreen SPF 30+ to sun-exposed areas, reapply every 2 hours as needed.  - Staying in the shade or wearing long sleeves, sun glasses (UVA+UVB protection) and wide brim hats (4-inch brim around the entire circumference of the hat) are also recommended for sun protection.  - Call for new or  changing lesions.  LENTIGINES, SEBORRHEIC KERATOSES, HEMANGIOMAS - Benign normal skin lesions - Sk at left temporal hairline x 2 - Benign-appearing - Call for any changes   Sebaceous Hyperplasia - Small yellow papules with a central dell forehead - Benign-appearing - Observe. Call for changes.   MELANOCYTIC NEVI - Tan-brown and/or pink-flesh-colored symmetric macules and papules - spinal lower back 6.0 mm tan papule, stable - Benign appearing on exam today - Observation - Call clinic for new or changing moles - Recommend daily use of broad spectrum spf 30+ sunscreen to sun-exposed areas.    Blue nevus vs Cyst  left infraocular  Exam: 2 mm firm blue papule    Plan: Benign-appearing. Stable compared to previous visit. Observation.  Call clinic for new or changing moles.  Recommend daily use of broad spectrum spf 30+ sunscreen to sun-exposed areas.   Dermatofibroma - Firm pink/brown papulenodule with dimple sign at right upper arm - Benign appearing - Call for any changes  Acrochordons (Skin Tags) At neck  - Fleshy, skin-colored pedunculated papules - Benign appearing.  - Observe. - If desired, they can be removed with an in office procedure that is not covered by insurance. - Please call the clinic if you notice any new or changing lesions.   ROSACEA with acne    Exam:  resolving inflammatory papules at left lateral cheek    Chronic and persistent condition with duration or expected duration over one year. Condition is improving with treatment but not currently at goal.      Rosacea is a chronic progressive skin condition usually affecting the face of adults, causing redness and/or  acne bumps. It is treatable but not curable. It sometimes affects the eyes (ocular rosacea) as well. It may respond to topical and/or systemic medication and can flare with stress, sun exposure, alcohol, exercise, topical steroids (including hydrocortisone/cortisone 10) and some foods.   Daily application of broad spectrum spf 30+ sunscreen to face is recommended to reduce flares.   Treatment Plan  D/C doxycycline 100 mg Start doxycyline 20 mg bid with food and drink daily  Continue metronidazole 0.75 % cream apply topically twice daily to aa's   Doxycycline should be taken with food to prevent nausea. Do not lay down for 30 minutes after taking. Be cautious with sun exposure and use good sun protection while on this medication. Pregnant women should not take this medication.    Schamberg's purpura Exam: b/l lower legs cayenne pepper macules   Treatment Plan:  Benign, observe.  Likely related to chronic lower leg swelling   Stasis in the legs causes chronic leg swelling, which may result in itchy or painful rashes, skin discoloration, skin texture changes, and sometimes ulceration.  Recommend daily graduated compression hose/stockings- easiest to put on first thing in morning, remove at bedtime.  Elevate legs as much as possible. Avoid salt/sodium rich foods.   ROSACEA   Related Medications doxycycline (PERIOSTAT) 20 MG tablet Take 1 tab by mouth bid  with food and drink metroNIDAZOLE (METROCREAM) 0.75 % cream Apply topically to affected areas twice daily Return in about 1 year (around 04/10/2024) for TBSE , rosacea .  I, Sharon Small, CMA, am acting as scribe for Sharon Niece, MD.   Documentation: I have reviewed the above documentation for accuracy and completeness, and I agree with the above.  Sharon Niece, MD

## 2023-04-11 NOTE — Patient Instructions (Addendum)
 Start doxycycline 20 mg tab take 1 tab by mouth twice daily with food and drink   Doxycycline should be taken with food to prevent nausea. Do not lay down for 30 minutes after taking. Be cautious with sun exposure and use good sun protection while on this medication. Pregnant women should not take this medication.     Seborrheic Keratosis  What causes seborrheic keratoses? Seborrheic keratoses are harmless, common skin growths that first appear during adult life.  As time goes by, more growths appear.  Some people may develop a large number of them.  Seborrheic keratoses appear on both covered and uncovered body parts.  They are not caused by sunlight.  The tendency to develop seborrheic keratoses can be inherited.  They vary in color from skin-colored to gray, brown, or even black.  They can be either smooth or have a rough, warty surface.   Seborrheic keratoses are superficial and look as if they were stuck on the skin.  Under the microscope this type of keratosis looks like layers upon layers of skin.  That is why at times the top layer may seem to fall off, but the rest of the growth remains and re-grows.    Treatment Seborrheic keratoses do not need to be treated, but can easily be removed in the office.  Seborrheic keratoses often cause symptoms when they rub on clothing or jewelry.  Lesions can be in the way of shaving.  If they become inflamed, they can cause itching, soreness, or burning.  Removal of a seborrheic keratosis can be accomplished by freezing, burning, or surgery. If any spot bleeds, scabs, or grows rapidly, please return to have it checked, as these can be an indication of a skin cancer.    Melanoma ABCDEs  Melanoma is the most dangerous type of skin cancer, and is the leading cause of death from skin disease.  You are more likely to develop melanoma if you: Have light-colored skin, light-colored eyes, or red or blond hair Spend a lot of time in the sun Tan regularly,  either outdoors or in a tanning bed Have had blistering sunburns, especially during childhood Have a close family member who has had a melanoma Have atypical moles or large birthmarks  Early detection of melanoma is key since treatment is typically straightforward and cure rates are extremely high if we catch it early.   The first sign of melanoma is often a change in a mole or a new dark spot.  The ABCDE system is a way of remembering the signs of melanoma.  A for asymmetry:  The two halves do not match. B for border:  The edges of the growth are irregular. C for color:  A mixture of colors are present instead of an even brown color. D for diameter:  Melanomas are usually (but not always) greater than 6mm - the size of a pencil eraser. E for evolution:  The spot keeps changing in size, shape, and color.  Please check your skin once per month between visits. You can use a small mirror in front and a large mirror behind you to keep an eye on the back side or your body.   If you see any new or changing lesions before your next follow-up, please call to schedule a visit.  Please continue daily skin protection including broad spectrum sunscreen SPF 30+ to sun-exposed areas, reapplying every 2 hours as needed when you're outdoors.   Staying in the shade or wearing long sleeves, sun  glasses (UVA+UVB protection) and wide brim hats (4-inch brim around the entire circumference of the hat) are also recommended for sun protection.     Due to recent changes in healthcare laws, you may see results of your pathology and/or laboratory studies on MyChart before the doctors have had a chance to review them. We understand that in some cases there may be results that are confusing or concerning to you. Please understand that not all results are received at the same time and often the doctors may need to interpret multiple results in order to provide you with the best plan of care or course of treatment.  Therefore, we ask that you please give [place] 2 business days to thoroughly review all your results before contacting the office for clarification. Should we see a critical lab result, you will be contacted sooner.   If You Need Anything After Your Visit  If you have any questions or concerns for your doctor, please call our main line at [phone] and press option 4 to reach your doctor's medical assistant. If no one answers, please leave a voicemail as directed and we will return your call as soon as possible. Messages left after 4 pm will be answered the following business day.   You may also send [place] a message via MyChart. We typically respond to MyChart messages within 1-2 business days.  For prescription refills, please ask your pharmacy to contact our office. Our fax number is [phone].  If you have an urgent issue when the clinic is closed that cannot wait until the next business day, you can page your doctor at the number below.    Please note that while we do our best to be available for urgent issues outside of office hours, we are not available 24/7.   If you have an urgent issue and are unable to reach [place], you may choose to seek medical care at your doctor's office, retail clinic, urgent care center, or emergency room.  If you have a medical emergency, please immediately call 911 or go to the emergency department.  Pager Numbers  - Dr. Gwen Pounds: [phone]  - Dr. Roseanne Reno: [phone]  - Dr. Katrinka Blazing: [phone]   In the event of inclement weather, please call our main line at [phone] for an update on the status of any delays or closures.  Dermatology Medication Tips: Please keep the boxes that topical medications come in in order to help keep track of the instructions about where and how to use these. Pharmacies typically print the medication instructions only on the boxes and not directly on the medication tubes.   If your medication is too expensive, please  contact our office at [phone] option 4 or send [place] a message through MyChart.   We are unable to tell what your co-pay for medications will be in advance as this is different depending on your insurance coverage. However, we may be able to find a substitute medication at lower cost or fill out paperwork to get insurance to cover a needed medication.   If a prior authorization is required to get your medication covered by your insurance company, please allow [place] 1-2 business days to complete this process.  Drug prices often vary depending on where the prescription is filled and some pharmacies may offer cheaper prices.  The website www.goodrx.com contains coupons for medications through different pharmacies. The prices here do not account for what the cost may be with help from insurance (it may be cheaper with your  insurance), but the website can give you the price if you did not use any insurance.  - You can print the associated coupon and take it with your prescription to the pharmacy.  - You may also stop by our office during regular business hours and pick up a GoodRx coupon card.  - If you need your prescription sent electronically to a different pharmacy, notify our office through [facility] or by phone at [phone] option 4.     Si Usted Necesita Algo Despus de Su Visita  Tambin puede enviarnos un mensaje a travs de Clinical cytogeneticist. Por lo general respondemos a los mensajes de MyChart en el transcurso de 1 a 2 das hbiles.  Para renovar recetas, por favor pida a su farmacia que se ponga en contacto con nuestra oficina. Annie Sable de fax es [place] [phone].  Si tiene un asunto urgente cuando la clnica est cerrada y que no puede esperar hasta el siguiente da hbil, puede llamar/localizar a su doctor(a) al nmero que aparece a continuacin.   Por favor, tenga en cuenta que aunque hacemos todo lo posible para estar disponibles para asuntos urgentes fuera del horario de  [place], no estamos disponibles las 24 horas del da, los 7 [address]  [address] de la [place].   Si tiene un problema urgente y no puede comunicarse con nosotros, puede optar por buscar atencin mdica  en el consultorio de su doctor(a), en una clnica privada, en un centro de atencin urgente o en una sala de emergencias.  Si tiene Engineer, drilling, por favor llame inmediatamente al 911 o vaya a la sala de emergencias.  Nmeros de bper  - Dr. Gwen Pounds: [phone]  - Dra. Roseanne Reno: [phone]  - Dr. Katrinka Blazing: [phone]   En caso de inclemencias del tiempo, por favor llame a Lacy Duverney principal al [phone] para una actualizacin sobre el [place] de cualquier retraso o cierre.  Consejos para la medicacin en dermatologa: Por favor, guarde las cajas en las que vienen los medicamentos de uso tpico para ayudarle a seguir las instrucciones sobre dnde y cmo usarlos. Las farmacias generalmente imprimen las instrucciones del medicamento slo en las cajas y no directamente en los tubos del [place].   Si su medicamento es muy caro, por favor, pngase en contacto con Rolm Gala llamando al [phone] y presione la opcin 4 o envenos un mensaje a travs de Clinical cytogeneticist.   No podemos decirle cul ser su copago por los medicamentos por adelantado ya que esto es diferente dependiendo de la cobertura de su seguro. Sin embargo, es posible que podamos encontrar un medicamento sustituto a Audiological scientist un formulario para que el seguro cubra el medicamento que se considera necesario.   Si se requiere una autorizacin previa para que su compaa de seguros [place] su medicamento, por favor permtanos de 1 a 2 das hbiles para completar [address]  Los precios de los medicamentos varan con frecuencia dependiendo del Environmental consultant de dnde se surte la receta y alguna farmacias pueden ofrecer precios ms baratos.  El sitio web www.goodrx.com tiene cupones para medicamentos de Health and safety inspector.  Los precios aqu no tienen en cuenta lo que podra costar con la ayuda del seguro (puede ser ms barato con su seguro), pero el sitio web puede darle el precio si no utiliz Tourist information centre manager.  - Puede imprimir el cupn correspondiente y llevarlo con su receta a la farmacia.  - Tambin puede pasar por nuestra oficina durante el horario de atencin regular y Librarian, academic tarjeta  de cupones de GoodRx.  - Si necesita que su receta se enve electrnicamente a una farmacia diferente, informe a nuestra oficina a travs de MyChart de [place] o por telfono llamando al [phone] y presione la opcin 4.

## 2023-04-19 ENCOUNTER — Other Ambulatory Visit: Payer: Self-pay | Admitting: Cardiovascular Disease

## 2023-04-19 DIAGNOSIS — H401111 Primary open-angle glaucoma, right eye, mild stage: Secondary | ICD-10-CM | POA: Diagnosis not present

## 2023-04-19 NOTE — Telephone Encounter (Signed)
 Prescription refill request for Eliquis received. Indication:afib Last office visit:10/24 Scr:0.75  1/25 Age: 73 Weight:98  kg  Prescription refilled

## 2023-04-19 NOTE — Telephone Encounter (Signed)
 Refill Request.

## 2023-05-03 DIAGNOSIS — H40003 Preglaucoma, unspecified, bilateral: Secondary | ICD-10-CM | POA: Diagnosis not present

## 2023-05-03 DIAGNOSIS — Z961 Presence of intraocular lens: Secondary | ICD-10-CM | POA: Diagnosis not present

## 2023-06-06 ENCOUNTER — Encounter: Payer: Self-pay | Admitting: Dermatology

## 2023-06-06 ENCOUNTER — Other Ambulatory Visit: Payer: Self-pay

## 2023-06-06 MED ORDER — IVERMECTIN 1 % EX CREA: 1.0000 | TOPICAL_CREAM | CUTANEOUS | 11 refills | Status: AC

## 2023-06-06 MED ORDER — DOXYCYCLINE MONOHYDRATE 100 MG PO CAPS: 100.0000 mg | ORAL_CAPSULE | Freq: Every day | ORAL | 3 refills | Status: DC

## 2023-06-08 NOTE — Progress Notes (Deleted)
 Cardiology Office Note  Date:  06/08/2023   ID:  IKRAM RIEBE, DOB 1950-11-13, MRN [id number]  PCP:  Mimi Alt, MD   No chief complaint on file.   HPI:  73 year old female with past medical history of hypertension,  hyperlipidemia, PAD/mild carotid disease on the left Acute stroke July 2022 Persistent atrial fibrillation, cardioversion March 2024 She presents for follow-up of her history of CVA, persistent atrial fibrillation  Last seen in clinic by myself oct 2024 Underwent cardioversion May 14, 2022, normal sinus rhythm restored  Follow-up today denies significant atrial fibrillation Labile blood pressure, sometimes measuring before medications, other times after medications Range 107 systolic up to 150 systolic Did not take medications this morning blood pressure 150 systolic Low heartbeat high 40s low 50s Some fatigue, no regular exercise program or walking program  Has 6 acres at home, uses a golf cart Likes to go shopping No lower extremity swelling, no orthopnea PND  Prior A-fib history reviewed Shortness of breath starting September 2023, atrial fibrillation Started on diltiazem  ER 120, continued on bisoprolol ,  diltiazem  in  follow-up up to twice daily dosing Started on flecainide   EKG personally reviewed by myself on todays visit     Other past medical history reviewed Hurt foot sept 2023 SOB since sept 2023 Atrial fibrillation noted on office visit January 2024  Previous diagnosis of stroke, leg weakness on right admitted to [facility] on 08/29/2020 and discharged on 09/01/2020. She was treated for CVA.   Presenting with right leg weakness and difficulty walking and feeling off balance.    CT scan of the head was unremarkable  MRI scan showed patchy left ACA infarct without hemorrhage.   CT angiogram of brain and neck showed no significant large vessel stenosis or occlusion.   2D echo showed ejection fraction of 55 to 60% without cardiac  source of embolism.   Telemetry monitoring in the hospital did not show any paroxysmal A. fib.    LDL cholesterol was 85 mg percent and hemoglobin A1c was 5.9.    She was started on dual antiplatelet therapy aspirin  and Plavix   Sx resolved that afternoon maintained on Plavix   Outpatient placement of ZIO monitor Patient had a min HR of 42 bpm, max HR of 184 bpm, and avg HR of 58 bpm.  Predominant underlying rhythm was Sinus Rhythm.    1 run of Ventricular Tachycardia occurred lasting 5 beats with a max rate of 184 bpm (avg 140 bpm).    36 Supraventricular Tachycardia  runs occurred, the run with the fastest interval lasting 4 beats with a max rate of 156 bpm, the longest lasting 11.3 secs with an avg rate of 121 bpm.  Isolated SVEs were rare (<1.0%), SVE Couplets were rare (<1.0%), and SVE Triplets were rare (<1.0%).    Isolated VEs were rare (<1.0%), and no VE Couplets or VE Triplets were present.  No patient triggered events recorded  Echo reviewed  1. Left ventricular ejection fraction, by estimation, is 55 to 60%. The  left ventricle has normal function. The left ventricle has no regional  wall motion abnormalities. There is mild concentric left ventricular  hypertrophy. Left ventricular diastolic  parameters were normal.   2. Right ventricular systolic function is normal. The right ventricular  size is normal.   3. The mitral valve is normal in structure. No evidence of mitral valve  regurgitation. No evidence of mitral stenosis.   4. The aortic valve is normal in structure. There is mild calcification  of the aortic valve. Aortic valve regurgitation is not visualized. No  aortic stenosis is present.   5. The inferior vena cava is normal in size with greater than 50%  respiratory variability, suggesting right atrial pressure of 3 mmHg.   Dad was smoker, died 11  PMH:   has a past medical history of CVA (cerebral vascular accident) (HCC), GERD (gastroesophageal reflux  disease), Hypertension, and Snores.  PSH:    Past Surgical History:  Procedure Laterality Date   CARDIOVERSION N/A 05/14/2022   Procedure: CARDIOVERSION;  Surgeon: Devorah Fonder, MD;  Location: [facility] ORS;  Service: Cardiovascular;  Laterality: N/A;   CATARACT EXTRACTION Bilateral 2003 & 2005   COLONOSCOPY     COLONOSCOPY WITH PROPOFOL  N/A 07/04/2015   Procedure: COLONOSCOPY WITH PROPOFOL ;  Surgeon: Marnee Sink, MD;  Location: [facility];  Service: Endoscopy;  Laterality: N/A;  prefers early   VAGINAL HYSTERECTOMY  2005   Bilateral oophorectomy    Current Outpatient Medications  Medication Sig Dispense Refill   albuterol  (VENTOLIN  HFA) 108 (90 Base) MCG/ACT inhaler Inhale 2 puffs into the lungs every 6 (six) hours as needed for wheezing or shortness of breath. 8 g 2   diltiazem  (CARDIZEM  CD) 120 MG 24 hr capsule TAKE 1 CAPSULE BY MOUTH TWICE DAILY 350 capsule 2   doxycycline  (MONODOX ) 100 MG capsule Take 1 capsule (100 mg total) by mouth daily. Take with food and drink 30 capsule 3   doxycycline  (PERIOSTAT ) 20 MG tablet Take 1 tab by mouth bid  with food and drink 60 tablet 6   ELIQUIS  5 MG TABS tablet TAKE 1 TABLET(5 MG) BY MOUTH TWICE DAILY 60 tablet 5   flecainide  (TAMBOCOR ) 100 MG tablet TAKE 1 TABLET(100 MG) BY MOUTH TWICE DAILY 180 tablet 3   Glucosamine HCl (GLUCOSAMINE PO) Take by mouth daily.     Ivermectin  1 % CREA Apply 1 Application topically as directed. qd to bid to face for Rosacea 30 g 11   MAGNESIUM PO Take by mouth daily as needed. For muscle spasms     metroNIDAZOLE  (METROCREAM ) 0.75 % cream Apply topically to affected areas twice daily 45 g 6   Multiple Vitamin (MULTIVITAMIN ADULT PO) Take 1 tablet by mouth daily. (Patient not taking: Reported on 04/11/2023)     omeprazole (PRILOSEC) 20 MG capsule Take 20 mg by mouth daily.     rosuvastatin  (CRESTOR ) 40 MG tablet TAKE 1 TABLET(40 MG) BY MOUTH DAILY 90 tablet 3   valsartan -hydrochlorothiazide  (DIOVAN  HCT)  160-12.5 MG tablet Take 1 tablet by mouth daily. 90 tablet 3   No current facility-administered medications for this visit.    Allergies:   Patient has no known allergies.   Social History:  The patient  reports that she has never smoked. She has never used smokeless tobacco. She reports that she does not drink alcohol and does not use drugs.   Family History:   family history includes Breast cancer (age of onset: 14) in her maternal aunt; COPD in her mother; Glaucoma in her sister; Heart disease in her father; Hypertension in her sister; Osteoporosis in her mother; Peripheral Artery Disease in her mother.    Review of Systems: Review of Systems  Constitutional: Negative.   HENT: Negative.    Respiratory: Negative.    Cardiovascular: Negative.   Gastrointestinal: Negative.   Musculoskeletal: Negative.   Neurological: Negative.   Psychiatric/Behavioral: Negative.    All other systems reviewed and are negative.   PHYSICAL EXAM: VS:  There were no vitals taken for this visit. , BMI There is no height or weight on file to calculate BMI. Constitutional:  oriented to person, place, and time. No distress.  HENT:  Head: Grossly normal Eyes:  no discharge. No scleral icterus.  Neck: No JVD, no carotid bruits  Cardiovascular: Regular rate and rhythm, no murmurs appreciated Pulmonary/Chest: Clear to auscultation bilaterally, no wheezes or rails Abdominal: Soft.  no distension.  no tenderness.  Musculoskeletal: Normal range of motion Neurological:  normal muscle tone. Coordination normal. No atrophy Skin: Skin warm and dry Psychiatric: normal affect, pleasant,  Recent Labs: 03/04/2023: ALT 20; BUN 18; Creatinine, Ser 0.75; Hemoglobin 13.5; Platelets 274; Potassium 4.4; Sodium 144; TSH 2.580    Lipid Panel Lab Results  Component Value Date   CHOL 143 03/04/2023   HDL 57 03/04/2023   LDLCALC 66 03/04/2023   TRIG 110 03/04/2023      Wt Readings from Last 3 Encounters:  03/02/23  216 lb (98 kg)  12/13/22 218 lb 6 oz (99.1 kg)  07/20/22 214 lb (97.1 kg)     ASSESSMENT AND PLAN:  Persistent atrial fibrillation Hx of cardioversion, maintaining normal sinus rhythm Tolerating diltiazem  ER 120 twice daily, bisoprolol  HCT  Discussion today concerning potential effects for bradycardia, possible fatigue Given bradycardia we will stop the bisoprolol  HCT 3 Tolerating Eliquis , flecainide  100 twice daily  Essential (primary) hypertension -  Some high blood pressures but has not been measuring systematically Recommend she monitor blood pressure 2 hours after taking morning medications Did not take medications this morning  History of stroke Prior history of stroke, atrial fibrillation found January 2024  on Eliquis  5 twice daily  Pure hypercholesterolemia Cholesterol is at goal on the current lipid regimen. No changes to the medications were made.  PAD/Carotid stenosis Mild disease on the left seen on CT scan images Cholesterol at goal No further testing needed   No orders of the defined types were placed in this encounter.    Signed, Juanda Noon, M.D., Ph.D. 06/08/2023  [facility] [place], [place] [phone]

## 2023-06-10 ENCOUNTER — Ambulatory Visit: Admitting: Cardiovascular Disease

## 2023-06-10 DIAGNOSIS — I4819 Other persistent atrial fibrillation: Secondary | ICD-10-CM

## 2023-06-10 DIAGNOSIS — I639 Cerebral infarction, unspecified: Secondary | ICD-10-CM

## 2023-06-10 DIAGNOSIS — I739 Peripheral vascular disease, unspecified: Secondary | ICD-10-CM

## 2023-06-10 DIAGNOSIS — I10 Essential (primary) hypertension: Secondary | ICD-10-CM

## 2023-06-10 DIAGNOSIS — E7800 Pure hypercholesterolemia, unspecified: Secondary | ICD-10-CM

## 2023-06-10 DIAGNOSIS — R7303 Prediabetes: Secondary | ICD-10-CM

## 2023-06-11 NOTE — Progress Notes (Unsigned)
 Cardiology Clinic Note   Date: 06/13/2023 ID: Litisha, Mohlman [birth year], MRN [id number]  Primary Cardiologist:  Belva Boyden, MD  Chief Complaint   Sharon Small is a 73 y.o. female who presents to the clinic today for routine follow up.   Patient Profile   Sharon Small is followed by Dr. Gollan for the history outlined below.      Past medical history significant for: PAF. Onset January 2024. DCCV 05/14/2022. Hypertension. Hyperlipidemia. Lipid panel 03/04/2023: LDL 66, HDL 57, TG 110, total 143. CVA. Echo 08/30/2020: EF 55 to 60%.  No RWMA.  Mild concentric LVH.  Normal diastolic parameters.  Normal RV size/function.  Mild aortic valve calcification without stenosis. 7-day ZIO 09/18/2020: HR 42 to 184 bpm, average 58 bpm.  Predominantly sinus rhythm.  1 run of NSVT lasting 5 beats max rate 184 bpm.  36 runs of SVT fastest 4 beats max rate 156 bpm, longest 11.3 seconds average rate 121 bpm.  Rare ectopy.  In summary, patient was first evaluated by Dr. Gollan on 11/17/2015 for carotid calcification seen on panoramic dental x-rays.  She reported a strong family history of CAD.  She was lost to follow-up until she reestablished with Dr. Gollan on 10/17/2020 after hospital admission for CVA in July 2022.  She presented to [facility] with right leg weakness and difficulty walking/feeling off balance.  CT scan of the head was unremarkable.  MRI showed patchy left ACA infarct without hemorrhage.  CTA brain and neck showed no significant large vessel stenosis or occlusion.  Echo demonstrated normal LV/RV function with no cardiac source of embolism.  She was started on DAPT and symptoms resolved.  At the time of her visit she was on monotherapy with Plavix .  She had followed up with neurology who recommended a loop recorder.  She wore a ZIO monitor which showed runs of SVT as detailed above.  She followed up with Dr. Gollan on 03/02/2022 with complaints of shortness of breath that began in  September 2023 in the setting of an ankle injury and was found to be in A-fib.  Amlodipine  was discontinued and she was started on diltiazem  and Eliquis .  She was instructed to stop Plavix  and continue bisoprolol .  Upon follow-up in late February 2024 she remained in A-fib but was hesitant to proceed with cardioversion.  She was started on flecainide .  On return visit in mid March 2024 she reported continued dyspnea with heavier exertion.  She remained in A-fib and underwent DCCV 10 days later.  She was maintaining sinus rhythm at follow-up the end of April 2024.  Patient was last seen in the office by Dr. Gollan on 12/13/2022 for routine follow-up.  She was maintaining sinus rhythm at that time.  She noted some fatigue with heart rate in the high 40s to low 50s.  She reported labile blood pressures with SBP 107 to 150 mmHg.  Bisoprolol  was discontinued.  She was instructed to check BP 2 hours after morning medications.  No other medication changes were made.     History of Present Illness    Today, patient is doing well. Patient denies shortness of breath, dyspnea on exertion, lower extremity edema, orthopnea or PND. No chest pain, pressure, or tightness. No palpitations.  She reports increased fatigue that impacts her activities. She states she will sometimes make the decision not to go somewhere or participate in an activity if she knows there is going to be a lot of walking. For example,  she opted to not go to the zoo secondary to not being able to tolerate the amount of walking it would require. She reports BP has been elevated at home ranging from 138-160/70-85. HR ranges from 40-50 bpm. She denies blood in stool or urine. She reports she has continued to take bisoprolol . She does not participate in regular activity. She is encouraged to begin walking 10 minutes per day and increase as tolerated.     ROS: All other systems reviewed and are otherwise negative except as noted in History of Present  Illness.  EKGs/Labs Reviewed    EKG Interpretation Date/Time:  Monday June 13 2023 08:36:10 EDT Ventricular Rate:  49 PR Interval:  242 QRS Duration:  100 QT Interval:  478 QTC Calculation: 431 R Axis:   14  Text Interpretation: Sinus bradycardia with 1st degree A-V block Minimal voltage criteria for LVH, may be normal variant ( R in aVL ) Nonspecific ST abnormality When compared with ECG of 13-Dec-2022 08:07, PR interval has increased Confirmed by Sharon Small) on 06/13/2023 8:42:26 AM   03/04/2023: ALT 20; AST 16; BUN 18; Creatinine, Ser 0.75; Potassium 4.4; Sodium 144   03/04/2023: Hemoglobin 13.5; WBC 6.2   03/04/2023: TSH 2.580    Risk Assessment/Calculations     CHA2DS2-VASc Score = 5   This indicates a 7.2% annual risk of stroke. The patient's score is based upon: CHF History: 0 HTN History: 1 Diabetes History: 0 Stroke History: 2 Vascular Disease History: 0 Age Score: 1 Gender Score: 1    HYPERTENSION CONTROL Vitals:   06/13/23 0830 06/13/23 1122  BP: (!) 147/82 (!) 150/80    The patient's blood pressure is elevated above target today.  In order to address the patient's elevated BP: A current anti-hypertensive medication was adjusted today.           Physical Exam    VS:  BP (!) 150/80 (BP Location: Left Arm, Patient Position: Sitting, Cuff Size: Normal)   Pulse (!) 52   Ht 5' 5.5" (1.664 m)   Wt 217 lb (98.4 kg)   SpO2 97%   BMI 35.56 kg/m  , BMI Body mass index is 35.56 kg/m.  GEN: Well nourished, well developed, in no acute distress. Neck: No JVD or carotid bruits. Cardiac:  RRR. No murmurs. No rubs or gallops.   Respiratory:  Respirations regular and unlabored. Clear to auscultation without rales, wheezing or rhonchi. GI: Soft, nontender, nondistended. Extremities: Radials/DP/PT 2+ and equal bilaterally. No clubbing or cyanosis. No edema.  Skin: Warm and dry, no rash. Neuro: Strength intact.  Assessment & Plan   PAF Onset  January 2024.  S/p DCCV March 2024.  Denies spontaneous bleeding concerns.  Patient denies palpitations. She reports fatigue that is impacting her lifestyle. She was not able to go to the zoo with her family as she knew she would tolerate all of the walking it would require.  Her HR is typically 40-50 bpm.  EKG today demonstrate sinus bradycardia with 1st degree AV block, 49 bpm. She notes that she has continued to take bisoprolol .  - Stop bisoprolol . If HR continues to drop into the 40s after 1 week she will decrease diltiazem  to once a day dosing.  - Continue diltiazem , flecainide , Eliquis .  Hypertension BP today 147/82 on intake and 150/80 on my recheck. Home BP typically 138/160/70-85. She denies headaches or dizziness.  - Increase Diovan  to 320-25 mg.  - BMP in 2 weeks.  - Continue diltiazem .  Hyperlipidemia LDL  66 January 2025, at goal. - Continue rosuvastatin .  Disposition: Stop bisoprolol . Increase Diovan . BMP in 2 weeks. Return in 3 months or sooner as needed.          Signed, Sharon Rives. Kiet Geer, DNP, NP-C

## 2023-06-13 ENCOUNTER — Ambulatory Visit: Attending: Student | Admitting: Student

## 2023-06-13 ENCOUNTER — Encounter: Payer: Self-pay | Admitting: Student

## 2023-06-13 VITALS — BP 150/80 | HR 52 | Ht 65.5 in | Wt 217.0 lb

## 2023-06-13 DIAGNOSIS — I480 Paroxysmal atrial fibrillation: Secondary | ICD-10-CM | POA: Diagnosis not present

## 2023-06-13 DIAGNOSIS — E7800 Pure hypercholesterolemia, unspecified: Secondary | ICD-10-CM | POA: Diagnosis not present

## 2023-06-13 DIAGNOSIS — I10 Essential (primary) hypertension: Secondary | ICD-10-CM

## 2023-06-13 MED ORDER — VALSARTAN-HYDROCHLOROTHIAZIDE 320-25 MG PO TABS: 1.0000 | ORAL_TABLET | Freq: Every day | ORAL | 3 refills | Status: AC

## 2023-06-13 NOTE — Patient Instructions (Addendum)
 Medication Instructions:  Your physician has recommended you make the following change in your medication:   STOP Bisoprolol  INCREASE Divon (Valstan-hydrochlorothiazide ) to 320-25 mg once daily  Monitor heart rates for one week and if it is still in the 40's then we will decrease your diltiazem  to once daily.   *If you need a refill on your cardiac medications before your next appointment, please call your pharmacy*  Lab Work: BMET in two weeks here in our office. No appointment is needed. Hours are 8-4 and they are closed daily from 1-2 for lunch.   If you have labs (blood work) drawn today and your tests are completely normal, you will receive your results only by: MyChart Message (if you have MyChart) OR A paper copy in the mail If you have any lab test that is abnormal or we need to change your treatment, we will call you to review the results.  Testing/Procedures: None  Follow-Up: At [facility], you and your health needs are our priority.  As part of our continuing mission to provide you with exceptional heart care, our providers are all part of one team.  This team includes your primary Cardiologist (physician) and Advanced Practice Providers or APPs (Physician Assistants and Nurse Practitioners) who all work together to provide you with the care you need, when you need it.  Your next appointment:   3 month(s)  Provider:   Morey Ar, NP

## 2023-06-14 ENCOUNTER — Ambulatory Visit: Payer: Medicare Other | Admitting: Cardiovascular Disease

## 2023-06-30 DIAGNOSIS — I4819 Other persistent atrial fibrillation: Secondary | ICD-10-CM | POA: Diagnosis not present

## 2023-07-01 ENCOUNTER — Ambulatory Visit: Payer: Self-pay | Admitting: Student

## 2023-07-01 LAB — BASIC METABOLIC PANEL WITH GFR
BUN/Creatinine Ratio: 18 (ref 12–28)
BUN: 13 mg/dL (ref 8–27)
CO2: 25 mmol/L (ref 20–29)
Calcium: 9.7 mg/dL (ref 8.7–10.3)
Chloride: 102 mmol/L (ref 96–106)
Creatinine, Ser: 0.72 mg/dL (ref 0.57–1.00)
Glucose: 96 mg/dL (ref 70–99)
Potassium: 4.3 mmol/L (ref 3.5–5.2)
Sodium: 142 mmol/L (ref 134–144)
eGFR: 88 mL/min/{1.73_m2} (ref >59)

## 2023-07-14 NOTE — Progress Notes (Signed)
 Letter sent.

## 2023-07-29 ENCOUNTER — Other Ambulatory Visit: Payer: Self-pay | Admitting: Cardiovascular Disease

## 2023-08-12 ENCOUNTER — Other Ambulatory Visit: Payer: Self-pay | Admitting: Family Medicine

## 2023-08-12 DIAGNOSIS — E7800 Pure hypercholesterolemia, unspecified: Secondary | ICD-10-CM

## 2023-08-30 ENCOUNTER — Ambulatory Visit: Payer: Self-pay | Admitting: Family Medicine

## 2023-09-02 ENCOUNTER — Telehealth: Payer: Self-pay | Admitting: *Deleted

## 2023-09-02 NOTE — Telephone Encounter (Signed)
 Scheduling reached out to patient in order to schedule her follow up appointment. At that time she had some concerns.   Called patient and she reports heart rates between 85-105. She denies any symptoms and states that last visit her medications were adjusted due to having low heart rates. After those changes her HR was running 60-65 until recent. She reports heart rate is higher when she is out in the heat up to 109 and that it improves when she is cooler.   She reports her crist mobile recognizes it as tachycardia and she has also had some that were identified as possible afib. Today her crist Crane shows that her heart rate is 82 and her blood pressure was 116/72 and HR on BP machine was 83. She again denied any symptoms. Reviewed strict ED precautions and scheduled her to come in and see Provider on Monday. She was very appreciative with no further questions at this time.

## 2023-09-03 NOTE — Progress Notes (Unsigned)
 Cardiology Office Note  Date:  09/05/2023   ID:  Sharon Small, DOB 04-30-1950, MRN [id number]  PCP:  Sharma Coyer, MD   Chief Complaint  Patient presents with   Follow-up    Tachycardia/3 month f/u c/o irregular heart beat. Meds reviewed verbally with pt.    HPI:  73 year old female with past medical history of hypertension,  hyperlipidemia, PAD/mild carotid disease on the left Acute stroke July 2022 Persistent atrial fibrillation, cardioversion March 2024 She presents for follow-up of her history of CVA, persistent atrial fibrillation  Last seen in clinic by myself 11/2022  cardioversion May 14, 2022, normal sinus rhythm restored 10/24: bradycardia, bisoprolol  was stopped  Two weeks ago, elevated rate while watering flowers Elevated rate since that time Very mild symptoms Denies significant fluid retention Kardia mobile device has tracked elevated heart rate since that time, often in the low 100 range  Continues on diltiazem  ER 120 twice daily and flecainide  twice daily with Eliquis  twice daily  Has 6 acres at home, uses a golf cart Likes to go shopping  EKG personally reviewed by myself on todays visit EKG Interpretation Date/Time:  Monday September 05 2023 11:37:38 EDT Ventricular Rate:  95 PR Interval:    QRS Duration:  82 QT Interval:  308 QTC Calculation: 387 R Axis:   11  Text Interpretation: Atrial flutter with variable A-V block with premature ventricular or aberrantly conducted complexes Nonspecific ST and T wave abnormality When compared with ECG of 13-Jun-2023 08:36, Atrial flutter has replaced Sinus rhythm Vent. rate has increased BY  46 BPM Nonspecific T wave abnormality now evident in Inferior leads Nonspecific T wave abnormality, worse in Anterolateral leads Confirmed by Perla Lye [phone]) on 09/05/2023 11:54:10 AM   Prior A-fib history reviewed Shortness of breath starting September 2023, atrial fibrillation Started on diltiazem  ER  120, continued on bisoprolol ,  diltiazem  in  follow-up up to twice daily dosing Started on flecainide   EKG personally reviewed by myself on todays visit EKG Interpretation Date/Time:  Monday September 05 2023 11:37:38 EDT Ventricular Rate:  95 PR Interval:    QRS Duration:  82 QT Interval:  308 QTC Calculation: 387 R Axis:   11  Text Interpretation: Atrial flutter with variable A-V block with premature ventricular or aberrantly conducted complexes Nonspecific ST and T wave abnormality When compared with ECG of 13-Jun-2023 08:36, Atrial flutter has replaced Sinus rhythm Vent. rate has increased BY  46 BPM Nonspecific T wave abnormality now evident in Inferior leads Nonspecific T wave abnormality, worse in Anterolateral leads Confirmed by Perla Lye [phone]) on 09/05/2023 11:54:10 AM   Other past medical history reviewed Hurt foot sept 2023 SOB since sept 2023 Atrial fibrillation noted on office visit January 2024  Previous diagnosis of stroke, leg weakness on right admitted to [facility] on 08/29/2020 and discharged on 09/01/2020. She was treated for CVA.   Presenting with right leg weakness and difficulty walking and feeling off balance.    CT scan of the head was unremarkable  MRI scan showed patchy left ACA infarct without hemorrhage.   CT angiogram of brain and neck showed no significant large vessel stenosis or occlusion.   2D echo showed ejection fraction of 55 to 60% without cardiac source of embolism.   Telemetry monitoring in the hospital did not show any paroxysmal A. fib.    LDL cholesterol was 85 mg percent and hemoglobin A1c was 5.9.    She was started on dual antiplatelet therapy aspirin  and Plavix   Sx  resolved that afternoon maintained on Plavix   Outpatient placement of ZIO monitor Patient had a min HR of 42 bpm, max HR of 184 bpm, and avg HR of 58 bpm.  Predominant underlying rhythm was Sinus Rhythm.    1 run of Ventricular Tachycardia occurred lasting 5 beats with a  max rate of 184 bpm (avg 140 bpm).    36 Supraventricular Tachycardia  runs occurred, the run with the fastest interval lasting 4 beats with a max rate of 156 bpm, the longest lasting 11.3 secs with an avg rate of 121 bpm.  Isolated SVEs were rare (<1.0%), SVE Couplets were rare (<1.0%), and SVE Triplets were rare (<1.0%).    Isolated VEs were rare (<1.0%), and no VE Couplets or VE Triplets were present.  No patient triggered events recorded  Echo reviewed  1. Left ventricular ejection fraction, by estimation, is 55 to 60%. The  left ventricle has normal function. The left ventricle has no regional  wall motion abnormalities. There is mild concentric left ventricular  hypertrophy. Left ventricular diastolic  parameters were normal.   2. Right ventricular systolic function is normal. The right ventricular  size is normal.   3. The mitral valve is normal in structure. No evidence of mitral valve  regurgitation. No evidence of mitral stenosis.   4. The aortic valve is normal in structure. There is mild calcification  of the aortic valve. Aortic valve regurgitation is not visualized. No  aortic stenosis is present.   5. The inferior vena cava is normal in size with greater than 50%  respiratory variability, suggesting right atrial pressure of 3 mmHg.   Dad was smoker, died 43  PMH:   has a past medical history of CVA (cerebral vascular accident) (HCC), GERD (gastroesophageal reflux disease), Hypertension, and Snores.  PSH:    Past Surgical History:  Procedure Laterality Date   CARDIOVERSION N/A 05/14/2022   Procedure: CARDIOVERSION;  Surgeon: Perla Evalene PARAS, MD;  Location: [facility] ORS;  Service: Cardiovascular;  Laterality: N/A;   CATARACT EXTRACTION Bilateral 2003 & 2005   COLONOSCOPY     COLONOSCOPY WITH PROPOFOL  N/A 07/04/2015   Procedure: COLONOSCOPY WITH PROPOFOL ;  Surgeon: Rogelia Copping, MD;  Location: [facility];  Service: Endoscopy;  Laterality: N/A;  prefers early    VAGINAL HYSTERECTOMY  2005   Bilateral oophorectomy    Current Outpatient Medications  Medication Sig Dispense Refill   albuterol  (VENTOLIN  HFA) 108 (90 Base) MCG/ACT inhaler Inhale 2 puffs into the lungs every 6 (six) hours as needed for wheezing or shortness of breath. 8 g 2   Boswellia-Glucosamine-Vit D (OSTEO BI-FLEX ONE PER DAY PO) Take by mouth daily.     diltiazem  (CARDIZEM  CD) 120 MG 24 hr capsule TAKE 1 CAPSULE BY MOUTH TWICE DAILY 350 capsule 2   doxycycline  (MONODOX ) 100 MG capsule Take 1 capsule (100 mg total) by mouth daily. Take with food and drink (Patient taking differently: Take 100 mg by mouth every other day. Take with food and drink) 30 capsule 3   ELIQUIS  5 MG TABS tablet TAKE 1 TABLET(5 MG) BY MOUTH TWICE DAILY 60 tablet 5   flecainide  (TAMBOCOR ) 100 MG tablet TAKE 1 TABLET(100 MG) BY MOUTH TWICE DAILY 180 tablet 3   Glucosamine HCl (GLUCOSAMINE PO) Take by mouth daily.     Ivermectin  1 % CREA Apply 1 Application topically as directed. qd to bid to face for Rosacea 30 g 11   MAGNESIUM PO Take by mouth daily as needed. For  muscle spasms     omeprazole (PRILOSEC) 20 MG capsule Take 20 mg by mouth daily.     rosuvastatin  (CRESTOR ) 40 MG tablet TAKE 1 TABLET BY MOUTH EVERY DAY. 90 tablet 3   valsartan -hydrochlorothiazide  (DIOVAN  HCT) 320-25 MG tablet Take 1 tablet by mouth daily. 90 tablet 3   doxycycline  (PERIOSTAT ) 20 MG tablet Take 1 tab by mouth bid  with food and drink 60 tablet 6   metroNIDAZOLE  (METROCREAM ) 0.75 % cream Apply topically to affected areas twice daily 45 g 6   Multiple Vitamin (MULTIVITAMIN ADULT PO) Take 1 tablet by mouth daily.     No current facility-administered medications for this visit.    Allergies:   Patient has no known allergies.   Social History:  The patient  reports that she has never smoked. She has never used smokeless tobacco. She reports that she does not drink alcohol and does not use drugs.   Family History:   family history  includes Breast cancer (age of onset: 54) in her maternal aunt; COPD in her mother; Glaucoma in her sister; Heart disease in her father; Hypertension in her sister; Osteoporosis in her mother; Peripheral Artery Disease in her mother.    Review of Systems: Review of Systems  Constitutional: Negative.   HENT: Negative.    Respiratory: Negative.    Cardiovascular: Negative.   Gastrointestinal: Negative.   Musculoskeletal: Negative.   Neurological: Negative.   Psychiatric/Behavioral: Negative.    All other systems reviewed and are negative.   PHYSICAL EXAM: VS:  BP 120/80 (BP Location: Left Arm, Patient Position: Sitting, Cuff Size: Normal)   Pulse 95   Ht 5' 5.5 (1.664 m)   Wt 210 lb 4 oz (95.4 kg)   SpO2 97%   BMI 34.46 kg/m  , BMI Body mass index is 34.46 kg/m. Constitutional:  oriented to person, place, and time. No distress.  HENT:  Head: Grossly normal Eyes:  no discharge. No scleral icterus.  Neck: No JVD, no carotid bruits  Cardiovascular: Irregularly irregular no murmurs appreciated Pulmonary/Chest: Clear to auscultation bilaterally, no wheezes or rails Abdominal: Soft.  no distension.  no tenderness.  Musculoskeletal: Normal range of motion Neurological:  normal muscle tone. Coordination normal. No atrophy Skin: Skin warm and dry Psychiatric: normal affect, pleasant,  Recent Labs: 03/04/2023: ALT 20; Hemoglobin 13.5; Platelets 274; TSH 2.580 06/30/2023: BUN 13; Creatinine, Ser 0.72; Potassium 4.3; Sodium 142    Lipid Panel Lab Results  Component Value Date   CHOL 143 03/04/2023   HDL 57 03/04/2023   LDLCALC 66 03/04/2023   TRIG 110 03/04/2023      Wt Readings from Last 3 Encounters:  09/05/23 210 lb 4 oz (95.4 kg)  06/13/23 217 lb (98.4 kg)  03/02/23 216 lb (98 kg)     ASSESSMENT AND PLAN:  Persistent atrial fibrillation Hx of cardioversion in 2024 EKG today showing atrial flutter Has appreciated elevated heart rates over the past several  weeks Tolerating diltiazem  ER 120 twice daily,  Eliquis , flecainide  100 twice daily - For better rate control we will add bisoprolol  5 twice daily - She will decide if she would like a cardioversion versus referral to EP for ablation.  She is leaning towards cardioversion  Essential (primary) hypertension -  Blood pressure stable, adding bisoprolol  as above for rate control  History of stroke Prior history of stroke, atrial fibrillation found January 2024 on Eliquis  5 twice daily - In atrial flutter on today's visit  Pure hypercholesterolemia Cholesterol is  at goal on the current lipid regimen. No changes to the medications were made.  PAD/Carotid stenosis Mild disease on the left seen on CT scan images Cholesterol at goal    Orders Placed This Encounter  Procedures   EKG 12-Lead     Signed, Velinda Lunger, M.D., Ph.D. 09/05/2023  [facility] [place], [place] [phone]

## 2023-09-05 ENCOUNTER — Encounter: Payer: Self-pay | Admitting: Cardiovascular Disease

## 2023-09-05 ENCOUNTER — Ambulatory Visit: Attending: Cardiovascular Disease | Admitting: Cardiovascular Disease

## 2023-09-05 VITALS — BP 120/80 | HR 95 | Ht 65.5 in | Wt 210.2 lb

## 2023-09-05 DIAGNOSIS — R7303 Prediabetes: Secondary | ICD-10-CM | POA: Diagnosis not present

## 2023-09-05 DIAGNOSIS — I739 Peripheral vascular disease, unspecified: Secondary | ICD-10-CM

## 2023-09-05 DIAGNOSIS — E7800 Pure hypercholesterolemia, unspecified: Secondary | ICD-10-CM | POA: Diagnosis not present

## 2023-09-05 DIAGNOSIS — I480 Paroxysmal atrial fibrillation: Secondary | ICD-10-CM | POA: Diagnosis not present

## 2023-09-05 DIAGNOSIS — I10 Essential (primary) hypertension: Secondary | ICD-10-CM

## 2023-09-05 DIAGNOSIS — I639 Cerebral infarction, unspecified: Secondary | ICD-10-CM

## 2023-09-05 MED ORDER — BISOPROLOL FUMARATE 5 MG PO TABS: 5.0000 mg | ORAL_TABLET | Freq: Two times a day (BID) | ORAL | 3 refills | Status: AC

## 2023-09-05 NOTE — Patient Instructions (Addendum)
 Medication Instructions:   Please start bisoprolol  5 mg twice a day  If you need a refill on your cardiac medications before your next appointment, please call your pharmacy.   Lab work: No new labs needed  Testing/Procedures: No new testing needed  Follow-Up: At [facility], you and your health needs are our priority.  As part of our continuing mission to provide you with exceptional heart care, we have created designated Provider Care Teams.  These Care Teams include your primary Cardiologist (physician) and Advanced Practice Providers (APPs -  Physician Assistants and Nurse Practitioners) who all work together to provide you with the care you need, when you need it.  You will need a follow up appointment in 1 month  Providers on your designated Care Team:   Lonni Meager, NP Bernardino Bring, PA-C Cadence Franchester, [place]  COVID-19 Vaccine Information can be found at: [email or web address] For questions related to vaccine distribution or appointments, please email [email or web address] or call [phone].

## 2023-09-28 ENCOUNTER — Ambulatory Visit: Admitting: Student

## 2023-10-08 NOTE — Progress Notes (Unsigned)
 Cardiology Office Note  Date:  10/10/2023    ID:  Sharon Small, DOB [birth year], MRN [id number]  PCP:  Sharma Coyer, MD   Chief Complaint  Patient presents with   1 month follow up     Patient c/o weight fluctuating with fluttering in times with feeling the need to cough.     HPI:  74 year old female with past medical history of hypertension,  hyperlipidemia, PAD/mild carotid disease on the left Acute stroke July 2022 Persistent atrial fibrillation, cardioversion March 2024 She presents for follow-up of her history of CVA, persistent atrial fibrillation  Last seen in clinic by myself 7/25  Doing pretty well in afib since July 2025 This past weekend hosted 100 people for engagement party, was very busy Active at baseline, denies significant shortness of breath on exertion [place]  has not changed lifestyle with atrial fibrillation Denies significant leg swelling, no PND orthopnea  Tolerating bisoprolol , diltiazem , flecainide , Eliquis   Prior cardiac history reviewed cardioversion May 14, 2022, normal sinus rhythm restored 10/24: bradycardia, bisoprolol  was stopped  7/25, elevated rate while watering flowers Elevated rate since that time, back in atrial fibrillation  Has 6 acres at home, uses a golf cart Likes to go shopping  EKG personally reviewed by myself on todays visit EKG Interpretation Date/Time:  Monday October 10 2023 11:26:31 EDT Ventricular Rate:  61 PR Interval:    QRS Duration:  100 QT Interval:  262 QTC Calculation: 263 R Axis:   19  Text Interpretation: Atrial fibrillation Nonspecific ST and T wave abnormality When compared with ECG of 05-Sep-2023 11:37, Atrial fibrillation has replaced Atrial flutter Vent. rate has decreased BY  34 BPM ST no longer elevated in Inferior leads ST no longer depressed in Lateral leads QT has shortened Confirmed by Tamira Ryland [phone]) on 10/10/2023 11:35:14 AM   Prior A-fib history  reviewed Shortness of breath starting September 2023, atrial fibrillation Started on diltiazem  ER 120, continued on bisoprolol ,  diltiazem  in  follow-up up to twice daily dosing Started on flecainide   Other past medical history reviewed Hurt foot sept 2023 SOB since sept 2023 Atrial fibrillation noted on office visit January 2024  Previous diagnosis of stroke, leg weakness on right admitted to [facility] on 08/29/2020 and discharged on 09/01/2020. She was treated for CVA.   Presenting with right leg weakness and difficulty walking and feeling off balance.    CT scan of the head was unremarkable  MRI scan showed patchy left ACA infarct without hemorrhage.   CT angiogram of brain and neck showed no significant large vessel stenosis or occlusion.   2D echo showed ejection fraction of 55 to 60% without cardiac source of embolism.   Telemetry monitoring in the hospital did not show any paroxysmal A. fib.    LDL cholesterol was 85 mg percent and hemoglobin A1c was 5.9.    She was started on dual antiplatelet therapy aspirin  and Plavix   Sx resolved that afternoon maintained on Plavix   Outpatient placement of ZIO monitor Patient had a min HR of 42 bpm, max HR of 184 bpm, and avg HR of 58 bpm.  Predominant underlying rhythm was Sinus Rhythm.    1 run of Ventricular Tachycardia occurred lasting 5 beats with a max rate of 184 bpm (avg 140 bpm).    36 Supraventricular Tachycardia  runs occurred, the run with the fastest interval lasting 4 beats with a max rate of 156 bpm, the longest lasting 11.3 secs with an avg rate of 121  bpm.  Isolated SVEs were rare (<1.0%), SVE Couplets were rare (<1.0%), and SVE Triplets were rare (<1.0%).    Isolated VEs were rare (<1.0%), and no VE Couplets or VE Triplets were present.  No patient triggered events recorded  Echo reviewed  1. Left ventricular ejection fraction, by estimation, is 55 to 60%. The  left ventricle has normal function. The left ventricle has  no regional  wall motion abnormalities. There is mild concentric left ventricular  hypertrophy. Left ventricular diastolic  parameters were normal.   2. Right ventricular systolic function is normal. The right ventricular  size is normal.   3. The mitral valve is normal in structure. No evidence of mitral valve  regurgitation. No evidence of mitral stenosis.   4. The aortic valve is normal in structure. There is mild calcification  of the aortic valve. Aortic valve regurgitation is not visualized. No  aortic stenosis is present.   5. The inferior vena cava is normal in size with greater than 50%  respiratory variability, suggesting right atrial pressure of 3 mmHg.   Dad was smoker, died 75  PMH:   has a past medical history of CVA (cerebral vascular accident) (HCC), GERD (gastroesophageal reflux disease), Hypertension, and Snores.  PSH:    Past Surgical History:  Procedure Laterality Date   CARDIOVERSION N/A 05/14/2022   Procedure: CARDIOVERSION;  Surgeon: Perla Evalene PARAS, MD;  Location: [facility];  Service: Cardiovascular;  Laterality: N/A;   CATARACT EXTRACTION Bilateral 2003 & 2005   COLONOSCOPY     COLONOSCOPY WITH PROPOFOL  N/A 07/04/2015   Procedure: COLONOSCOPY WITH PROPOFOL ;  Surgeon: Rogelia Copping, MD;  Location: [facility];  Service: Endoscopy;  Laterality: N/A;  prefers early   VAGINAL HYSTERECTOMY  2005   Bilateral oophorectomy    Current Outpatient Medications  Medication Sig Dispense Refill   albuterol  (VENTOLIN  HFA) 108 (90 Base) MCG/ACT inhaler Inhale 2 puffs into the lungs every 6 (six) hours as needed for wheezing or shortness of breath. 8 g 2   bisoprolol  (ZEBETA ) 5 MG tablet Take 1 tablet (5 mg total) by mouth 2 (two) times daily. 180 tablet 3   Boswellia-Glucosamine-Vit D (OSTEO BI-FLEX ONE PER DAY PO) Take by mouth daily.     diltiazem  (CARDIZEM  CD) 120 MG 24 hr capsule TAKE 1 CAPSULE BY MOUTH TWICE DAILY 350 capsule 2   doxycycline  (MONODOX ) 100 MG  capsule Take 1 capsule (100 mg total) by mouth daily. Take with food and drink 30 capsule 3   ELIQUIS  5 MG TABS tablet TAKE 1 TABLET(5 MG) BY MOUTH TWICE DAILY 60 tablet 5   flecainide  (TAMBOCOR ) 100 MG tablet TAKE 1 TABLET(100 MG) BY MOUTH TWICE DAILY 180 tablet 3   Glucosamine HCl (GLUCOSAMINE PO) Take by mouth daily.     Ivermectin  1 % CREA Apply 1 Application topically as directed. qd to bid to face for Rosacea 30 g 11   MAGNESIUM PO Take by mouth daily as needed. For muscle spasms     omeprazole (PRILOSEC) 20 MG capsule Take 20 mg by mouth daily.     rosuvastatin  (CRESTOR ) 40 MG tablet TAKE 1 TABLET BY MOUTH EVERY DAY. 90 tablet 3   valsartan -hydrochlorothiazide  (DIOVAN  HCT) 320-25 MG tablet Take 1 tablet by mouth daily. 90 tablet 3   No current facility-administered medications for this visit.    Allergies:   Patient has no known allergies.   Social History:  The patient  reports that she has never smoked. She has never  used smokeless tobacco. She reports that she does not drink alcohol and does not use drugs.   Family History:   family history includes Breast cancer (age of onset: 23) in her maternal aunt; COPD in her mother; Glaucoma in her sister; Heart disease in her father; Hypertension in her sister; Osteoporosis in her mother; Peripheral Artery Disease in her mother.    Review of Systems: Review of Systems  Constitutional: Negative.   HENT: Negative.    Respiratory: Negative.    Cardiovascular: Negative.   Gastrointestinal: Negative.   Musculoskeletal: Negative.   Neurological: Negative.   Psychiatric/Behavioral: Negative.    All other systems reviewed and are negative.  PHYSICAL EXAM: VS:  BP 110/70 (BP Location: Left Arm, Patient Position: Sitting, Cuff Size: Normal)   Pulse 61   Ht 5' 5.5 (1.664 m)   Wt 213 lb (96.6 kg)   SpO2 98%   BMI 34.91 kg/m  , BMI Body mass index is 34.91 kg/m. Constitutional:  oriented to person, place, and time. No distress.  HENT:   Head: Grossly normal Eyes:  no discharge. No scleral icterus.  Neck: No JVD, no carotid bruits  Cardiovascular: Irregularly irregular, no murmurs appreciated Pulmonary/Chest: Clear to auscultation bilaterally, no wheezes or rails Abdominal: Soft.  no distension.  no tenderness.  Musculoskeletal: Normal range of motion Neurological:  normal muscle tone. Coordination normal. No atrophy Skin: Skin warm and dry Psychiatric: normal affect, pleasant  Recent Labs: 03/04/2023: ALT 20; Hemoglobin 13.5; Platelets 274; TSH 2.580 06/30/2023: BUN 13; Creatinine, Ser 0.72; Potassium 4.3; Sodium 142   Lipid Panel Lab Results  Component Value Date   CHOL 143 03/04/2023   HDL 57 03/04/2023   LDLCALC 66 03/04/2023   TRIG 110 03/04/2023    Wt Readings from Last 3 Encounters:  10/10/23 213 lb (96.6 kg)  09/05/23 210 lb 4 oz (95.4 kg)  06/13/23 217 lb (98.4 kg)    ASSESSMENT AND PLAN:  Persistent atrial fibrillation Hx of cardioversion in 2024 Prior office visit showing atrial flutter, now showing atrial fibrillation -Relatively asymptomatic, denies significant palpitations Tolerating diltiazem  ER 120 twice daily,  Eliquis , flecainide  100 twice daily, bisoprolol  5 twice daily - So far she is leaning towards rate control rather than cardioversion and rhythm control or ablation with the EP - We have recommended limited echocardiogram to evaluate ejection fraction, if this remains normal, she may pursue rate control in which case we could hold her flecainide   Essential (primary) hypertension -  Blood pressure is well controlled on today's visit. No changes made to the medications.  History of stroke Prior history of stroke, atrial fibrillation found January 2024 on Eliquis  5 twice daily - In atrial flutter on last clinic visit, now EKG showing atrial fibrillation  Pure hypercholesterolemia Cholesterol is at goal on the current lipid regimen. No changes to the medications were  made.  PAD/Carotid stenosis Mild disease on the left seen on CT scan images Cholesterol at goal, LDL 66    Orders Placed This Encounter  Procedures   EKG 12-Lead     Signed, Velinda Lunger, M.D., Ph.D. 10/10/2023  [facility] [place], [place] [phone]

## 2023-10-10 ENCOUNTER — Encounter: Payer: Self-pay | Admitting: Cardiovascular Disease

## 2023-10-10 ENCOUNTER — Ambulatory Visit: Attending: Cardiovascular Disease | Admitting: Cardiovascular Disease

## 2023-10-10 VITALS — BP 110/70 | HR 61 | Ht 65.5 in | Wt 213.0 lb

## 2023-10-10 DIAGNOSIS — I480 Paroxysmal atrial fibrillation: Secondary | ICD-10-CM

## 2023-10-10 DIAGNOSIS — R7303 Prediabetes: Secondary | ICD-10-CM

## 2023-10-10 DIAGNOSIS — I639 Cerebral infarction, unspecified: Secondary | ICD-10-CM | POA: Diagnosis not present

## 2023-10-10 DIAGNOSIS — I10 Essential (primary) hypertension: Secondary | ICD-10-CM | POA: Diagnosis not present

## 2023-10-10 DIAGNOSIS — I6930 Unspecified sequelae of cerebral infarction: Secondary | ICD-10-CM | POA: Diagnosis not present

## 2023-10-10 DIAGNOSIS — I739 Peripheral vascular disease, unspecified: Secondary | ICD-10-CM | POA: Diagnosis not present

## 2023-10-10 DIAGNOSIS — E7800 Pure hypercholesterolemia, unspecified: Secondary | ICD-10-CM

## 2023-10-10 NOTE — Patient Instructions (Addendum)
 Medication Instructions:  No changes  If you need a refill on your cardiac medications before your next appointment, please call your pharmacy.   Lab work: No new labs needed  Testing/Procedures: Your physician has requested that you have a limited echocardiogram. Echocardiography is a painless test that uses sound waves to create images of your heart. It provides your doctor with information about the size and shape of your heart and how well your heart's chambers and valves are working.   You may receive an ultrasound enhancing agent through an IV if needed to better visualize your heart during the echo. This procedure takes approximately one hour.  There are no restrictions for this procedure.  This will take place at [address] [facility]) [address], [place] [address]  Please note: We ask at that you not bring children with you during ultrasound (echo/ vascular) testing. Due to room size and safety concerns, children are not allowed in the ultrasound rooms during exams. Our front office staff cannot provide observation of children in our lobby area while testing is being conducted. An adult accompanying a patient to their appointment will only be allowed in the ultrasound room at the discretion of the ultrasound technician under special circumstances. We apologize for any inconvenience.   Follow-Up: At [facility], you and your health needs are our priority.  As part of our continuing mission to provide you with exceptional heart care, we have created designated Provider Care Teams.  These Care Teams include your primary Cardiologist (physician) and Advanced Practice Providers (APPs -  Physician Assistants and Nurse Practitioners) who all work together to provide you with the care you need, when you need it.  You will need a follow up appointment in 6 months  Providers on your designated Care Team:   Lonni Meager, NP Bernardino Bring, PA-C Cadence [place],  [place]  COVID-19 Vaccine Information can be found at: [email or web address] For questions related to vaccine distribution or appointments, please email [email or web address] or call [phone].

## 2023-10-15 ENCOUNTER — Other Ambulatory Visit: Payer: Self-pay | Admitting: Cardiovascular Disease

## 2023-10-18 NOTE — Telephone Encounter (Signed)
 Prescription refill request for Eliquis  received. Indication:AFIB Last office visit:8/25 Scr:0.72  5/25 Age: 74 Weight:96.5  kg  Prescription refilled

## 2023-11-07 DIAGNOSIS — Z961 Presence of intraocular lens: Secondary | ICD-10-CM | POA: Diagnosis not present

## 2023-11-07 DIAGNOSIS — H40003 Preglaucoma, unspecified, bilateral: Secondary | ICD-10-CM | POA: Diagnosis not present

## 2023-11-17 ENCOUNTER — Other Ambulatory Visit

## 2023-11-17 ENCOUNTER — Encounter: Payer: Self-pay | Admitting: Cardiovascular Disease

## 2023-11-17 ENCOUNTER — Encounter: Payer: Self-pay | Admitting: Family Medicine

## 2023-11-17 NOTE — Telephone Encounter (Signed)
 Last read by Darice GORMAN Harvest at 3:01PM on 11/17/2023.

## 2023-11-21 ENCOUNTER — Ambulatory Visit: Payer: Self-pay

## 2023-11-21 NOTE — Telephone Encounter (Signed)
 FYI Only or Action Required?: FYI only for provider.  Patient was last seen in primary care on 03/02/2023 by Sharma Coyer, MD.  Called Nurse Triage reporting Anxiety.  Symptoms began about a month ago.  Interventions attempted: OTC medications: melatonin.  Symptoms are: unchanged.  Triage Disposition: See PCP When Office is Open (Within 3 Days)  Patient/caregiver understands and will follow disposition?: Yes          Copied from CRM [id number]. Topic: Clinical - Red Word Triage >> Nov 21, 2023  3:59 PM Antwanette L wrote: Red Word that prompted transfer to Nurse Triage: The patient needs to schedule an appointment to address her mental health concerns. She reports experiencing difficulty with anxiety and is uncertain whether her atrial fibrillation (AFib) may be contributing to these symptoms Reason for Disposition  MODERATE anxiety (e.g., persistent or frequent anxiety symptoms; interferes with sleep, school, or work)    Pt son-in-law recently had brain aneurysm and is currently recovering. Pt has not been sleeping well and has concern for anxiety d/t helping his family during recovery. Pt would like to discuss medication options with PCP.  Pt also requesting flu vaccine, so pt was transferred to [facility] further assist.  Answer Assessment - Initial Assessment Questions 1. CONCERN: Did anything happen that prompted you to call today?      Anxiety - wanting to see if rx will help 2. ANXIETY SYMPTOMS: Can you describe how you (your loved one; patient) have been feeling? (e.g., tense, restless, panicky, anxious, keyed up, overwhelmed, sense of impending doom).      Recent sickness with in-law that is now recovering. Having trouble sleeping-- will wake up in the middle of the night and staying awake 3. ONSET: How long have you been feeling this way? (e.g., hours, days, weeks)     X 1 mo 4. SEVERITY: How would you rate the level of anxiety? (e.g., 0 - 10; or  mild, moderate, severe).     *No Answer* 5. FUNCTIONAL IMPAIRMENT: How have these feelings affected your ability to do daily activities? Have you had more difficulty than usual doing your normal daily activities? (e.g., getting better, same, worse; self-care, school, work, interactions)     Yes, impacting sleep 6. HISTORY: Have you felt this way before? Have you ever been diagnosed with an anxiety problem in the past? (e.g., generalized anxiety disorder, panic attacks, PTSD). If Yes, ask: How was this problem treated? (e.g., medicines, counseling, etc.)     Anxiety, concerns for PTSD 7. RISK OF HARM - SUICIDAL IDEATION: Do you ever have thoughts of hurting or killing yourself? If Yes, ask:  Do you have these feelings now? Do you have a plan on how you would do this?     denies 8. TREATMENT:  What has been done so far to treat this anxiety? (e.g., medicines, relaxation strategies). What has helped?     relaxation 9. THERAPIST: Do you have a counselor or therapist? If Yes, ask: What is their name?     N/a 10. POTENTIAL TRIGGERS: Do you drink caffeinated beverages (e.g., coffee, colas, teas), and how much daily? Do you drink alcohol or use any drugs? Have you started any new medicines recently?       *No Answer* 11. PATIENT SUPPORT: Who is with you now? Who do you live with? Do you have family or friends who you can talk to?        yes 12. OTHER SYMPTOMS: Do you have any other symptoms? (e.g.,  feeling depressed, trouble concentrating, trouble sleeping, trouble breathing, palpitations or fast heartbeat, chest pain, sweating, nausea, or diarrhea)       Hx of afib 13. PREGNANCY: Is there any chance you are pregnant? When was your last menstrual period?       N/a  Protocols used: Anxiety and Panic Attack-A-AH

## 2023-11-22 ENCOUNTER — Ambulatory Visit (INDEPENDENT_AMBULATORY_CARE_PROVIDER_SITE_OTHER): Admitting: Family Medicine

## 2023-11-22 ENCOUNTER — Encounter: Payer: Self-pay | Admitting: Family Medicine

## 2023-11-22 ENCOUNTER — Ambulatory Visit

## 2023-11-22 VITALS — BP 144/71 | HR 50 | Ht 65.0 in | Wt 211.1 lb

## 2023-11-22 DIAGNOSIS — I4819 Other persistent atrial fibrillation: Secondary | ICD-10-CM

## 2023-11-22 DIAGNOSIS — Z23 Encounter for immunization: Secondary | ICD-10-CM | POA: Diagnosis not present

## 2023-11-22 DIAGNOSIS — F418 Other specified anxiety disorders: Secondary | ICD-10-CM | POA: Diagnosis not present

## 2023-11-22 DIAGNOSIS — R4589 Other symptoms and signs involving emotional state: Secondary | ICD-10-CM | POA: Diagnosis not present

## 2023-11-22 MED ORDER — BUSPIRONE HCL 7.5 MG PO TABS: 7.5000 mg | ORAL_TABLET | Freq: Two times a day (BID) | ORAL | 2 refills | Status: DC

## 2023-11-22 NOTE — Telephone Encounter (Signed)
 Patient call note and symptoms reviewed. Agree with scheduled appt. Will evaluate during OV

## 2023-11-22 NOTE — Patient Instructions (Signed)
 To keep you healthy, please keep in mind the following health maintenance items that you are due for:   Health Maintenance Due  Topic Date Due   Influenza Vaccine  09/16/2023     Best Wishes,   Dr. Lang

## 2023-11-22 NOTE — Progress Notes (Signed)
 Established patient visit   Patient: Sharon Small   DOB: March 31, 1950   73 y.o. Female  MRN: [id number] Visit Date: 11/22/2023  Today's healthcare provider: Rockie Agent, MD   Chief Complaint  Patient presents with  . Anxiety    Patient presents with new onset anxiety. Reports that she is concerned her afib may be making her feel this way.  States she has this sporadic sense of anxiety, not daily but happens randomly.   Patient would be open to trying as needed medication   . Immunizations    Patient would like to receive flu vaccine today   . Insomnia    Patient reports some issues with staying asleep, states she wakes up and is sometime unable to go back to sleep. Is taking delayed release melatonin and seems to help with this   Subjective     HPI     Anxiety    Additional comments: Patient presents with new onset anxiety. Reports that she is concerned her afib may be making her feel this way.  States she has this sporadic sense of anxiety, not daily but happens randomly.   Patient would be open to trying as needed medication         Immunizations    Additional comments: Patient would like to receive flu vaccine today         Insomnia    Additional comments: Patient reports some issues with staying asleep, states she wakes up and is sometime unable to go back to sleep. Is taking delayed release melatonin and seems to help with this      Last edited by Cherry Chiquita HERO, CMA on 11/22/2023  4:14 PM.       Discussed the use of AI scribe software for clinical note transcription with the patient, who gave verbal consent to proceed.  History of Present Illness Sharon Small is a 73 year old female with atrial fibrillation who presents with anxiety.  She experiences anxiety related to her atrial fibrillation, [place] 4 [place] with a racing heart and an overwhelming need to move, which she associates  with a panic attack. She feels anxious about her heart condition, although she does not experience shakiness or fluttering.  Her heart rate is low, around fifty, and she recalls her cardiologist advised her to keep it lower rather than higher. She is currently taking several medications for her heart condition, including flecainide  100 mg, Eliquis  5 mg twice a day, diltiazem  120 mg twice a day, valsartan  and hydrochlorothiazide  320 mg and 25 mg once a day, and magnesium at night for cramps. She also takes glucosamine for her knees.  She has been exploring over-the-counter options for anxiety and sleep, such as melatonin and Z-Quil, but is concerned about potential interactions with her cardiac medications. She has settled on melatonin, with input from her granddaughter, a Engineer, petroleum, and a pharmacist.  Her anxiety is exacerbated by recent family stress, including her son-in-law's brain aneurysm and subsequent hospitalization, which has been a significant emotional burden, contributing to her anxiety and impacting her daily life.  She reports difficulty with physical activity, noting that she cannot walk very fast and struggles with inclines, which she attributes to her slow heart rate and atrial fibrillation. This has altered her lifestyle, making her more cautious about physical exertion.  No use of herbal supplements or teas that could affect her condition. She drinks  Dr. Nunzio, Coke, water , and milk, and denies using alcohol.     Past Medical History:  Diagnosis Date  . CVA (cerebral vascular accident) (HCC)   . GERD (gastroesophageal reflux disease)   . Hypertension   . Snores     Medications: Outpatient Medications Prior to Visit  Medication Sig  . albuterol  (VENTOLIN  HFA) 108 (90 Base) MCG/ACT inhaler Inhale 2 puffs into the lungs every 6 (six) hours as needed for wheezing or shortness of breath.  . bisoprolol  (ZEBETA ) 5 MG tablet Take 1 tablet (5 mg total) by mouth 2 (two)  times daily.  . diltiazem  (CARDIZEM  CD) 120 MG 24 hr capsule TAKE 1 CAPSULE BY MOUTH TWICE DAILY  . doxycycline  (MONODOX ) 100 MG capsule Take 1 capsule (100 mg total) by mouth daily. Take with food and drink  . ELIQUIS  5 MG TABS tablet TAKE 1 TABLET(5 MG) BY MOUTH TWICE DAILY  . flecainide  (TAMBOCOR ) 100 MG tablet TAKE 1 TABLET(100 MG) BY MOUTH TWICE DAILY  . Glucosamine HCl (GLUCOSAMINE PO) Take by mouth daily.  . Ivermectin  1 % CREA Apply 1 Application topically as directed. qd to bid to face for Rosacea  . MAGNESIUM PO Take by mouth daily as needed. For muscle spasms  . Melatonin 1 MG CAPS Take 5 mg by mouth at bedtime.  SABRA omeprazole (PRILOSEC) 20 MG capsule Take 20 mg by mouth daily.  . rosuvastatin  (CRESTOR ) 40 MG tablet TAKE 1 TABLET BY MOUTH EVERY DAY.  . valsartan -hydrochlorothiazide  (DIOVAN  HCT) 320-25 MG tablet Take 1 tablet by mouth daily.  . [DISCONTINUED] Boswellia-Glucosamine-Vit D (OSTEO BI-FLEX ONE PER DAY PO) Take by mouth daily.   No facility-administered medications prior to visit.    Review of Systems  Last CBC Lab Results  Component Value Date   WBC 6.2 03/04/2023   HGB 13.5 03/04/2023   HCT 41.0 03/04/2023   MCV 92 03/04/2023   MCH 30.3 03/04/2023   RDW 12.2 03/04/2023   PLT 274 03/04/2023   Last metabolic panel Lab Results  Component Value Date   GLUCOSE 96 06/30/2023   NA 142 06/30/2023   K 4.3 06/30/2023   CL 102 06/30/2023   CO2 25 06/30/2023   BUN 13 06/30/2023   CREATININE 0.72 06/30/2023   EGFR 88 06/30/2023   CALCIUM  9.7 06/30/2023   PROT 6.7 03/04/2023   ALBUMIN 4.4 03/04/2023   LABGLOB 2.3 03/04/2023   AGRATIO 1.9 03/01/2022   BILITOT 0.3 03/04/2023   ALKPHOS 67 03/04/2023   AST 16 03/04/2023   ALT 20 03/04/2023   ANIONGAP 7 05/04/2022   Last lipids Lab Results  Component Value Date   CHOL 143 03/04/2023   HDL 57 03/04/2023   LDLCALC 66 03/04/2023   TRIG 110 03/04/2023   CHOLHDL 2.5 03/04/2023   Last thyroid  functions Lab  Results  Component Value Date   TSH 2.580 03/04/2023        11/22/2023    4:13 PM 03/02/2023   11:12 AM  GAD 7 : Generalized Anxiety Score  Nervous, Anxious, on Edge 1 0  Control/stop worrying 1 0  Worry too much - different things 1 0  Trouble relaxing 1 0  Restless 0 0  Easily annoyed or irritable 0 1  Afraid - awful might happen 1 0  Total GAD 7 Score 5 1  Anxiety Difficulty Somewhat difficult Not difficult at all    Flowsheet Row Office Visit from 11/22/2023 in [facility]  PHQ-9 Total Score 3    {  See past labs  Heme  Chem  Endocrine  Serology  Results Review (optional):1}   Objective    BP (!) 144/71 (BP Location: Right Arm, Patient Position: Sitting, Cuff Size: Normal)   Pulse (!) 50   Ht 5' 5 (1.651 m)   Wt 211 lb 1.6 oz (95.8 kg)   SpO2 98%   BMI 35.13 kg/m   BP Readings from Last 3 Encounters:  11/22/23 (!) 144/71  10/10/23 110/70  09/05/23 120/80   Wt Readings from Last 3 Encounters:  11/22/23 211 lb 1.6 oz (95.8 kg)  10/10/23 213 lb (96.6 kg)  09/05/23 210 lb 4 oz (95.4 kg)    {See vitals history (optional):1}    Physical Exam Vitals reviewed.  Constitutional:      General: She is not in acute distress.    Appearance: Normal appearance. She is normal weight. She is not ill-appearing, toxic-appearing or diaphoretic.     Comments: Well groomed, calmly sitting   Cardiovascular:     Rate and Rhythm: Normal rate and regular rhythm.  Pulmonary:     Effort: Pulmonary effort is normal. No respiratory distress.     Breath sounds: No wheezing, rhonchi or rales.  Neurological:     Mental Status: She is alert and oriented to person, place, and time.  Psychiatric:        Attention and Perception: Attention and perception normal. She is attentive. She does not perceive auditory or visual hallucinations.        Mood and Affect: Mood and affect normal.        Speech: Speech normal.        Behavior: Behavior normal. Behavior  is cooperative.        Thought Content: Thought content normal. Thought content is not paranoid or delusional. Thought content does not include homicidal or suicidal ideation. Thought content does not include homicidal or suicidal plan.        Judgment: Judgment normal.     ***  No results found for any visits on 11/22/23.  Assessment & Plan     Problem List Items Addressed This Visit   None Visit Diagnoses       Immunization due    -  Primary       Assessment and Plan Assessment & Plan Atrial fibrillation Chronic atrial fibrillation with a slow heart rate, managed with flecainide , Eliquis , diltiazem , and valsartan /hydrochlorothiazide . Reports fatigue and reduced exercise tolerance. Cardioversion was previously effective for one year. Considering ablation due to persistent AFib and low heart rate. - Continue flecainide , Eliquis , diltiazem , valsartan /hydrochlorothiazide . - Follow up with cardiologist on November 7 for echocardiogram and discussion of potential ablation.  Anxiety disorder Increased anxiety symptoms, likely exacerbated by recent family stressors and atrial fibrillation. Reports episodes of heart racing and difficulty returning to sleep, suggestive of panic attacks. Melatonin has been somewhat effective for sleep-related anxiety but not daytime anxiety. Buspirone is considered due to its as-needed use and low dependency risk compared to benzodiazepines. - Prescribe Buspirone 7.5 mg twice a day as needed for anxiety. - Discuss potential side effects and non-habit forming nature of Buspirone. - Consider Xanax for severe episodes, though not preferred due to dependency risks.     No follow-ups on file.         Rockie Agent, MD  [facility] [phone] (phone) [phone] (fax)  [facility]

## 2023-11-23 NOTE — Assessment & Plan Note (Signed)
 Chronic condition with a slow heart rate, managed with flecainide , Eliquis , diltiazem , and valsartan /hydrochlorothiazide . Reports fatigue and reduced exercise tolerance. Cardioversion was previously effective for one year. Considering ablation due to persistent AFib and low heart rate. - Continue flecainide , Eliquis , diltiazem , valsartan /hydrochlorothiazide . - Follow up with cardiologist on November 7 for echocardiogram and discussion of potential ablation.

## 2023-12-01 NOTE — Telephone Encounter (Signed)
 Ok to get her scheduled sooner with PCP as she requested given worsening of symptoms.

## 2023-12-02 ENCOUNTER — Ambulatory Visit (INDEPENDENT_AMBULATORY_CARE_PROVIDER_SITE_OTHER)

## 2023-12-02 ENCOUNTER — Ambulatory Visit: Admitting: Family Medicine

## 2023-12-02 VITALS — BP 144/56 | HR 54 | Resp 16

## 2023-12-02 DIAGNOSIS — F5101 Primary insomnia: Secondary | ICD-10-CM

## 2023-12-02 MED ORDER — DOXEPIN HCL 3 MG PO TABS: 3.0000 mg | ORAL_TABLET | Freq: Every evening | ORAL | 3 refills | Status: DC

## 2023-12-02 NOTE — Progress Notes (Signed)
 Acute visit   Patient: Sharon Small   DOB: 10-28-1950   73 y.o. Female  MRN: [id number] PCP: Sharma Coyer, MD   Chief Complaint  Patient presents with   Acute Visit    sleep and anxiety (6 wks)   Subjective    Discussed the use of AI scribe software for clinical note transcription with the patient, who gave verbal consent to proceed.  History of Present Illness Sharon Small is a 73 year old female with insomnia and anxiety who presents with sleep disturbances.  She has been experiencing sleep disturbances for approximately six weeks, beginning around Labor Day. She wakes up at 4:00 AM and is unable to return to sleep, which is unusual as she typically sleeps 7-8 hours per night. Initially, the sleep issues were intermittent but have since become more frequent, leading to a pattern where she feels anxious about going to bed, describing the bed as her 'enemy'.  She has tried melatonin and a small dose of buspirone, prescribed by a previous doctor. Initially, she felt some improvement, but the effects were short-lived. Recently, she increased her melatonin dose to 10 mg, which seemed to help her sleep better, as she was able to sleep from 10 PM to 5 AM, her usual wake-up time. She is unsure if the improvement was due to the increased melatonin or a combination with buspirone.  She experiences episodes of anxiety, described as a feeling of something 'flowing through' her, which are short-lived but concerning. She has a history of postpartum depression with her third child but has not experienced depression or anxiety since then until now.  Her social history includes having a pharmacy student in the family, which influences her awareness of medication interactions, particularly with her blood thinner. She is cautious about medication choices due to potential interactions and side effects.   Review of systems as noted in HPI.   Objective    BP (!) 144/56 (BP  Location: Right Arm, Patient Position: Sitting, Cuff Size: Normal)   Pulse (!) 54   Resp 16   SpO2 99%  Physical Exam Constitutional:      Appearance: Normal appearance.  HENT:     Head: Normocephalic and atraumatic.     Mouth/Throat:     Mouth: Mucous membranes are moist.  Eyes:     Pupils: Pupils are equal, round, and reactive to light.  Pulmonary:     Effort: Pulmonary effort is normal.  Skin:    General: Skin is warm.  Neurological:     General: No focal deficit present.     Mental Status: She is alert.       No results found for any visits on 12/02/23.  Assessment & Plan     Problem List Items Addressed This Visit   None Visit Diagnoses       Primary insomnia    -  Primary   Relevant Medications   Doxepin HCl 3 MG TABS       Assessment and Plan Assessment & Plan Insomnia Patient with insomnia and anxiety symptoms for six weeks. Discussed medication options with patient. Doxepin preferred for safety and non-sedating properties. - Prescribe doxepin for sleep, 30 minutes before bed. - Continue melatonin, decrease dose if morning grogginess occurs. - Continue buspirone if helpful, otherwise discontinue. - Consider trazodone if doxepin is ineffective.   Meds ordered this encounter  Medications   Doxepin HCl 3 MG TABS    Sig: Take 1 tablet (3  mg total) by mouth at bedtime.    Dispense:  30 tablet    Refill:  3     No follow-ups on file.      Isaiah DELENA Pepper, MD  [facility] [phone] (phone) [phone] (fax)

## 2023-12-06 ENCOUNTER — Telehealth: Payer: Self-pay

## 2023-12-06 ENCOUNTER — Other Ambulatory Visit (HOSPITAL_COMMUNITY): Payer: Self-pay

## 2023-12-06 NOTE — Telephone Encounter (Signed)
 Pharmacy Patient Advocate Encounter   Received notification from Onbase that prior authorization for Doxepin 3 mg tabs is required/requested.   Insurance verification completed.   The patient is insured through [employer].   Per test claim:  ZOLPIDEM TABS;ESZOPICLONE;ZALEPLON;RAMELTEON;BELSOMRA is preferred by the insurance.  If suggested medication is appropriate, Please send in a new RX and discontinue this one. If not, please advise as to why it's not appropriate so that we may request a Prior Authorization. Please note, some preferred medications may still require a PA.  If the suggested medications have not been trialed and there are no contraindications to their use, the PA will not be submitted, as it will not be approved.

## 2023-12-07 NOTE — Telephone Encounter (Signed)
 Per provider she can be switched to Ramelteon. Pt advised. Pt reports she does not want to change rx as they regimen she is currently on is working. [facility] pharmacy was able to get discount card to help her with cost,

## 2023-12-16 ENCOUNTER — Other Ambulatory Visit: Payer: Self-pay | Admitting: Dermatology

## 2023-12-23 ENCOUNTER — Ambulatory Visit: Attending: Cardiovascular Disease

## 2023-12-23 ENCOUNTER — Other Ambulatory Visit: Payer: Self-pay | Admitting: Cardiovascular Disease

## 2023-12-23 DIAGNOSIS — I739 Peripheral vascular disease, unspecified: Secondary | ICD-10-CM

## 2023-12-23 DIAGNOSIS — I480 Paroxysmal atrial fibrillation: Secondary | ICD-10-CM

## 2023-12-23 DIAGNOSIS — I6930 Unspecified sequelae of cerebral infarction: Secondary | ICD-10-CM

## 2023-12-23 DIAGNOSIS — E7800 Pure hypercholesterolemia, unspecified: Secondary | ICD-10-CM

## 2023-12-23 DIAGNOSIS — I10 Essential (primary) hypertension: Secondary | ICD-10-CM

## 2023-12-23 DIAGNOSIS — R7303 Prediabetes: Secondary | ICD-10-CM

## 2023-12-23 DIAGNOSIS — I639 Cerebral infarction, unspecified: Secondary | ICD-10-CM

## 2023-12-26 ENCOUNTER — Encounter: Payer: Self-pay | Admitting: Cardiovascular Disease

## 2023-12-28 LAB — ECHOCARDIOGRAM COMPLETE
AR max vel: 1.79 cm2
AV Area VTI: 1.82 cm2
AV Area mean vel: 1.7 cm2
AV Mean grad: 5 mmHg
AV Peak grad: 10 mmHg
Ao pk vel: 1.58 m/s
Area-P 1/2: 3.48 cm2
S' Lateral: 2.9 cm

## 2023-12-31 ENCOUNTER — Ambulatory Visit: Payer: Self-pay | Admitting: Cardiovascular Disease

## 2024-02-17 ENCOUNTER — Other Ambulatory Visit: Payer: Self-pay | Admitting: Family Medicine

## 2024-02-17 DIAGNOSIS — Z1231 Encounter for screening mammogram for malignant neoplasm of breast: Secondary | ICD-10-CM

## 2024-03-07 ENCOUNTER — Ambulatory Visit: Payer: Self-pay
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
## 2024-03-07 NOTE — Telephone Encounter (Signed)
 I have reviewed patient call and agree with recommendations

## 2024-03-08 ENCOUNTER — Ambulatory Visit: Admitting: Nurse Practitioner

## 2024-03-08 ENCOUNTER — Encounter: Payer: Self-pay | Admitting: Nurse Practitioner

## 2024-03-08 VITALS — BP 132/78 | HR 52 | Temp 97.9°F | Ht 65.0 in | Wt 212.0 lb

## 2024-03-08 DIAGNOSIS — J069 Acute upper respiratory infection, unspecified: Secondary | ICD-10-CM | POA: Diagnosis not present

## 2024-03-08 MED ORDER — PREDNISONE 20 MG PO TABS: 40.0000 mg | ORAL_TABLET | Freq: Every day | ORAL | 0 refills | Status: AC

## 2024-03-08 MED ORDER — AMOXICILLIN-POT CLAVULANATE 875-125 MG PO TABS: 1.0000 | ORAL_TABLET | Freq: Two times a day (BID) | ORAL | 0 refills | Status: AC

## 2024-03-08 NOTE — Patient Instructions (Signed)
 VISIT SUMMARY:  During your visit, we discussed your persistent respiratory symptoms following a bout of bronchitis. We reviewed your current symptoms and medications, and made adjustments to help alleviate your cough and postnasal drip.  YOUR PLAN:  ACUTE BRONCHITIS WITH PERSISTENT COUGH AND POSTNASAL DRIP: You have a persistent cough and postnasal drip following acute bronchitis. There is no fever or ear pain, but your ears feel stopped up and you have a runny nose. -We prescribed Augmentin  to help with your persistent symptoms. -We prescribed prednisone  to reduce inflammation and improve your airway. -We recommended taking cetirizine for your runny nose and postnasal drip. -For cough relief, we advised using honey if not allergic to it and doing salt water  gargles.

## 2024-03-08 NOTE — Progress Notes (Signed)
 "  Established Patient Office Visit  Subjective:  Patient ID: Sharon Small, female    DOB: 06/14/1950  Age: 74 y.o. MRN: [id number]  CC:  Chief Complaint  Patient presents with   Acute Visit    Follow up from UC visit - Bronchitis  Feels like it has not cleared up & still coughing  Prescribed Z pack & promethazine   History of Present Illness  Discussed the use of AI scribe software for clinical note transcription with the patient, who gave verbal consent to proceed.  History of Present Illness   Sharon Small is a 74 year old female here for acute visit for persistent respiratory symptoms.  Her symptoms began in mid-December after her husband was hospitalized with pneumonia. She was evaluated at [facility] on [facility]/24  where flu and COVID tests were negative and chest x-ray showed no pneumonia. She was diagnosed with bronchitis and treated with a Z-Pak, which partially improved but did not fully resolve her symptoms.   She now has a persistent productive cough with mild throat irritation and intermittent small mucus plugs, runny nose, a sense of head fullness, and ears that feel stopped up. She denies fever and sinus pain or pressure. She has atrial fibrillation on Eliquis . She was prescribed an albuterol  inhaler, which she uses occasionally and did not use the morning of the visit. Nasal congestion and need to blow her nose frequently have improved somewhat.      Past Medical History:  Diagnosis Date   CVA (cerebral vascular accident) (HCC)    GERD (gastroesophageal reflux disease)    Hypertension    Snores     Past Surgical History:  Procedure Laterality Date   CARDIOVERSION N/A 05/14/2022   Procedure: CARDIOVERSION;  Surgeon: Perla Evalene PARAS, MD;  Location: [facility];  Service: Cardiovascular;  Laterality: N/A;   CATARACT EXTRACTION Bilateral 2003 & 2005   COLONOSCOPY     COLONOSCOPY WITH PROPOFOL  N/A 07/04/2015   Procedure: COLONOSCOPY WITH PROPOFOL ;  Surgeon:  Rogelia Copping, MD;  Location: [facility];  Service: Endoscopy;  Laterality: N/A;  prefers early   VAGINAL HYSTERECTOMY  2005   Bilateral oophorectomy    Family History  Problem Relation Age of Onset   COPD Mother    Peripheral Artery Disease Mother    Osteoporosis Mother    Heart disease Father        fatal MI age 45   Hypertension Sister    Glaucoma Sister    Breast cancer Maternal Aunt 52    Social History   Socioeconomic History   Marital status: Married    Spouse name: Not on file   Number of children: Not on file   Years of education: Not on file   Highest education level: Some college, no degree  Occupational History   Not on file  Tobacco Use   Smoking status: Never   Smokeless tobacco: Never  Vaping Use   Vaping status: Never Used  Substance and Sexual Activity   Alcohol use: No   Drug use: No   Sexual activity: Yes  Other Topics Concern   Not on file  Social History Narrative   Not on file   Social Drivers of Health   Tobacco Use: Low Risk (03/08/2024)   Patient History    Smoking Tobacco Use: Never    Smokeless Tobacco Use: Never    Passive Exposure: Not on file  Financial Resource Strain: Low Risk (12/01/2023)   Overall Financial  Resource Strain (CARDIA)    Difficulty of Paying Living Expenses: Not very hard  Food Insecurity: No Food Insecurity (12/01/2023)   Epic    Worried About Programme Researcher, Broadcasting/film/video in the Last Year: Never true    Ran Out of Food in the Last Year: Never true  Transportation Needs: No Transportation Needs (12/01/2023)   Epic    Lack of Transportation (Medical): No    Lack of Transportation (Non-Medical): No  Physical Activity: Insufficiently Active (12/01/2023)   Exercise Vital Sign    Days of Exercise per Week: 2 days    Minutes of Exercise per Session: 20 min  Stress: Stress Concern Present (12/01/2023)   [employer] of Occupational Health - Occupational Stress Questionnaire    Feeling of Stress: Very much   Social Connections: Socially Integrated (12/01/2023)   Social Connection and Isolation Panel    Frequency of Communication with Friends and Family: More than three times a week    Frequency of Social Gatherings with Friends and Family: More than three times a week    Attends Religious Services: More than 4 times per year    Active Member of [employer] or Organizations: Yes    Attends Banker Meetings: More than 4 times per year    Marital Status: Married  Catering Manager Violence: Not At Risk (03/02/2023)   Humiliation, Afraid, Rape, and Kick questionnaire    Fear of Current or Ex-Partner: No    Emotionally Abused: No    Physically Abused: No    Sexually Abused: No  Depression (PHQ2-9): Low Risk (03/08/2024)   Depression (PHQ2-9)    PHQ-2 Score: 0  Alcohol Screen: Low Risk (07/20/2022)   Alcohol Screen    Last Alcohol Screening Score (AUDIT): 0  Housing: Low Risk (12/01/2023)   Epic    Unable to Pay for Housing in the Last Year: No    Number of Times Moved in the Last Year: 0    Homeless in the Last Year: No  Utilities: Not At Risk (11/21/2021)   [facility] Utilities    Threatened with loss of utilities: No  Health Literacy: Adequate Health Literacy (03/02/2023)   B1300 Health Literacy    Frequency of need for help with medical instructions: Never     Outpatient Medications Prior to Visit  Medication Sig Dispense Refill   albuterol  (VENTOLIN  HFA) 108 (90 Base) MCG/ACT inhaler Inhale 2 puffs into the lungs every 6 (six) hours as needed for wheezing or shortness of breath. 8 g 2   bisoprolol  (ZEBETA ) 5 MG tablet Take 1 tablet (5 mg total) by mouth 2 (two) times daily. 180 tablet 3   diltiazem  (CARDIZEM  CD) 120 MG 24 hr capsule TAKE 1 CAPSULE BY MOUTH TWICE DAILY 350 capsule 2   doxycycline  (MONODOX ) 100 MG capsule TAKE 1 CAPSULE BY MOUTH DAILY. TAKE WITH FOOD AND DRINK. 30 capsule 3   ELIQUIS  5 MG TABS tablet TAKE 1 TABLET(5 MG) BY MOUTH TWICE DAILY 60 tablet 5   flecainide   (TAMBOCOR ) 100 MG tablet TAKE 1 TABLET(100 MG) BY MOUTH TWICE DAILY 180 tablet 3   Glucosamine HCl (GLUCOSAMINE PO) Take by mouth daily.     Ivermectin  1 % CREA Apply 1 Application topically as directed. qd to bid to face for Rosacea 30 g 11   MAGNESIUM PO Take by mouth daily as needed. For muscle spasms     omeprazole (PRILOSEC) 20 MG capsule Take 20 mg by mouth daily.     rosuvastatin  (CRESTOR ) 40  MG tablet TAKE 1 TABLET BY MOUTH EVERY DAY. 90 tablet 3   valsartan -hydrochlorothiazide  (DIOVAN  HCT) 320-25 MG tablet Take 1 tablet by mouth daily. 90 tablet 3   busPIRone  (BUSPAR ) 7.5 MG tablet Take 1 tablet (7.5 mg total) by mouth 2 (two) times daily. 60 tablet 2   Doxepin  HCl 3 MG TABS Take 1 tablet (3 mg total) by mouth at bedtime. 30 tablet 3   Melatonin 1 MG CAPS Take 5 mg by mouth at bedtime.     No facility-administered medications prior to visit.    Allergies[1]  ROS Review of Systems Negative unless indicated in HPI.    Objective:    Physical Exam Constitutional:      Appearance: Normal appearance.  HENT:     Right Ear: A middle ear effusion is present.     Left Ear: A middle ear effusion is present.     Nose: Rhinorrhea present.     Right Turbinates: Swollen.     Left Turbinates: Swollen. Not enlarged.     Right Sinus: No maxillary sinus tenderness or frontal sinus tenderness.     Left Sinus: No maxillary sinus tenderness or frontal sinus tenderness.     Mouth/Throat:     Mouth: Mucous membranes are moist.     Pharynx: Postnasal drip present. No pharyngeal swelling, oropharyngeal exudate or posterior oropharyngeal erythema.     Tonsils: No tonsillar exudate.  Cardiovascular:     Rate and Rhythm: Normal rate and regular rhythm.  Pulmonary:     Effort: Pulmonary effort is normal.     Breath sounds: Normal breath sounds. No stridor. No wheezing.  Neurological:     General: No focal deficit present.     Mental Status: She is alert and oriented to person, place, and  time. Mental status is at baseline.  Psychiatric:        Mood and Affect: Mood normal.        Behavior: Behavior normal.        Thought Content: Thought content normal.        Judgment: Judgment normal.     BP 132/78   Pulse (!) 52   Temp 97.9 F (36.6 C)   Ht 5' 5 (1.651 m)   Wt 212 lb (96.2 kg)   SpO2 98%   BMI 35.28 kg/m  Wt Readings from Last 3 Encounters:  03/08/24 212 lb (96.2 kg)  11/22/23 211 lb 1.6 oz (95.8 kg)  10/10/23 213 lb (96.6 kg)     Health Maintenance  Topic Date Due   COVID-19 Vaccine (5 - 2025-26 season) 10/17/2023   Medicare Annual Wellness (AWV)  03/01/2024   Mammogram  03/20/2024   Colonoscopy  07/03/2025   Pneumococcal Vaccine: 50+ Years  Completed   Influenza Vaccine  Completed   Bone Density Scan  Completed   Hepatitis C Screening  Completed   Zoster Vaccines- Shingrix   Completed   Meningococcal B Vaccine  Aged Out   DTaP/Tdap/Td  Discontinued    There are no preventive care reminders to display for this patient.  Lab Results  Component Value Date   TSH 2.580 03/04/2023   Lab Results  Component Value Date   WBC 6.2 03/04/2023   HGB 13.5 03/04/2023   HCT 41.0 03/04/2023   MCV 92 03/04/2023   PLT 274 03/04/2023   Lab Results  Component Value Date   NA 142 06/30/2023   K 4.3 06/30/2023   CO2 25 06/30/2023   GLUCOSE 96 06/30/2023  BUN 13 06/30/2023   CREATININE 0.72 06/30/2023   BILITOT 0.3 03/04/2023   ALKPHOS 67 03/04/2023   AST 16 03/04/2023   ALT 20 03/04/2023   PROT 6.7 03/04/2023   ALBUMIN 4.4 03/04/2023   CALCIUM  9.7 06/30/2023   ANIONGAP 7 05/04/2022   EGFR 88 06/30/2023   Lab Results  Component Value Date   CHOL 143 03/04/2023   Lab Results  Component Value Date   HDL 57 03/04/2023   Lab Results  Component Value Date   LDLCALC 66 03/04/2023   Lab Results  Component Value Date   TRIG 110 03/04/2023   Lab Results  Component Value Date   CHOLHDL 2.5 03/04/2023   Lab Results  Component Value Date    HGBA1C 6.1 (H) 03/04/2023      Assessment & Plan:   Assessment & Plan URI with cough and congestion Persistent cough and postnasal drip post-acute bronchitis. No fever or ear pain. Ears feel stopped up. No wheezing. Postnasal drip observed.  - Prescribed Augmentin  for persistent symptoms. - Prescribed prednisone  to reduce inflammation and improve airway patency. - Recommended cetirizine for runny nose and postnasal drip. - Advised honey for cough if not allergic to it and salt water  gargles.      Assessment & Plan    Follow-up: Return if symptoms worsen or fail to improve.   Chelsea Aurora, NP    [1] No Known Allergies  "

## 2024-03-08 NOTE — Assessment & Plan Note (Signed)
 Persistent cough and postnasal drip post-acute bronchitis. No fever or ear pain. Ears feel stopped up. No wheezing. Postnasal drip observed.  - Prescribed Augmentin  for persistent symptoms. - Prescribed prednisone  to reduce inflammation and improve airway patency. - Recommended cetirizine for runny nose and postnasal drip. - Advised honey for cough if not allergic to it and salt water  gargles.

## 2024-03-23 ENCOUNTER — Inpatient Hospital Stay: Admission: RE | Admit: 2024-03-23

## 2024-03-23 DIAGNOSIS — Z1231 Encounter for screening mammogram for malignant neoplasm of breast: Secondary | ICD-10-CM

## 2024-04-04 ENCOUNTER — Encounter

## 2024-04-09 ENCOUNTER — Ambulatory Visit: Admitting: Cardiovascular Disease

## 2024-04-23 ENCOUNTER — Encounter: Payer: Medicare Other | Admitting: Dermatology
